# Patient Record
Sex: Female | Born: 1970
Health system: Southern US, Community
[De-identification: ages and names within clinical notes are randomized; demographics above are authoritative.]

## PROBLEM LIST (undated history)

## (undated) DIAGNOSIS — T7840XA Allergy, unspecified, initial encounter: Secondary | ICD-10-CM

## (undated) DIAGNOSIS — R7611 Nonspecific reaction to tuberculin skin test without active tuberculosis: Secondary | ICD-10-CM

## (undated) DIAGNOSIS — F419 Anxiety disorder, unspecified: Secondary | ICD-10-CM

## (undated) DIAGNOSIS — H40009 Preglaucoma, unspecified, unspecified eye: Secondary | ICD-10-CM

## (undated) DIAGNOSIS — F329 Major depressive disorder, single episode, unspecified: Secondary | ICD-10-CM

## (undated) DIAGNOSIS — C9210 Chronic myeloid leukemia, BCR/ABL-positive, not having achieved remission: Secondary | ICD-10-CM

## (undated) DIAGNOSIS — Z5189 Encounter for other specified aftercare: Secondary | ICD-10-CM

## (undated) DIAGNOSIS — T783XXA Angioneurotic edema, initial encounter: Secondary | ICD-10-CM

## (undated) DIAGNOSIS — L509 Urticaria, unspecified: Secondary | ICD-10-CM

## (undated) DIAGNOSIS — D649 Anemia, unspecified: Secondary | ICD-10-CM

## (undated) HISTORY — PX: TUBAL LIGATION: SHX77

## (undated) HISTORY — DX: Anxiety disorder, unspecified: F41.9

## (undated) HISTORY — DX: Major depressive disorder, single episode, unspecified: F32.9

## (undated) HISTORY — DX: Encounter for other specified aftercare: Z51.89

## (undated) HISTORY — DX: Angioneurotic edema, initial encounter: T78.3XXA

## (undated) HISTORY — DX: Nonspecific reaction to tuberculin skin test without active tuberculosis: R76.11

## (undated) HISTORY — DX: Chronic myeloid leukemia, BCR/ABL-positive, not having achieved remission: C92.10

## (undated) HISTORY — DX: Preglaucoma, unspecified, unspecified eye: H40.009

## (undated) HISTORY — DX: Urticaria, unspecified: L50.9

## (undated) HISTORY — DX: Allergy, unspecified, initial encounter: T78.40XA

## (undated) HISTORY — DX: Anemia, unspecified: D64.9

---

## 2002-04-30 ENCOUNTER — Other Ambulatory Visit: Admission: RE | Admit: 2002-04-30 | Discharge: 2002-04-30 | Payer: Self-pay | Admitting: Obstetrics

## 2004-04-14 ENCOUNTER — Inpatient Hospital Stay (HOSPITAL_COMMUNITY): Admission: AD | Admit: 2004-04-14 | Discharge: 2004-04-14 | Payer: Self-pay | Admitting: Obstetrics

## 2004-12-14 ENCOUNTER — Ambulatory Visit (HOSPITAL_COMMUNITY): Admission: RE | Admit: 2004-12-14 | Discharge: 2004-12-14 | Payer: Self-pay | Admitting: Obstetrics

## 2004-12-20 ENCOUNTER — Inpatient Hospital Stay (HOSPITAL_COMMUNITY): Admission: AD | Admit: 2004-12-20 | Discharge: 2004-12-25 | Payer: Self-pay | Admitting: Obstetrics & Gynecology

## 2004-12-22 ENCOUNTER — Encounter (INDEPENDENT_AMBULATORY_CARE_PROVIDER_SITE_OTHER): Payer: Self-pay | Admitting: *Deleted

## 2009-08-16 LAB — CONVERTED CEMR LAB: Pap Smear: NORMAL

## 2009-10-06 ENCOUNTER — Ambulatory Visit (HOSPITAL_COMMUNITY): Admission: RE | Admit: 2009-10-06 | Discharge: 2009-10-06 | Payer: Self-pay | Admitting: Obstetrics & Gynecology

## 2009-12-30 ENCOUNTER — Ambulatory Visit: Payer: Self-pay | Admitting: Internal Medicine

## 2010-01-28 ENCOUNTER — Ambulatory Visit: Payer: Self-pay | Admitting: Internal Medicine

## 2010-01-29 LAB — CONVERTED CEMR LAB
Basophils Absolute: 0.1 10*3/uL (ref 0.0–0.1)
Basophils Relative: 1.9 % (ref 0.0–3.0)
CO2: 30 meq/L (ref 19–32)
Creatinine, Ser: 0.7 mg/dL (ref 0.4–1.2)
Eosinophils Relative: 1.4 % (ref 0.0–5.0)
HCT: 38.9 % (ref 36.0–46.0)
HDL: 60.5 mg/dL (ref >39.00)
LDL Cholesterol: 124 mg/dL — ABNORMAL HIGH (ref 0–99)
Lymphocytes Relative: 30.8 % (ref 12.0–46.0)
MCHC: 34.2 g/dL (ref 30.0–36.0)
Monocytes Relative: 5.5 % (ref 3.0–12.0)
Neutrophils Relative %: 60.4 % (ref 43.0–77.0)
Platelets: 232 10*3/uL (ref 150.0–400.0)
Sodium: 142 meq/L (ref 135–145)
TSH: 2.43 microintl units/mL (ref 0.35–5.50)
Total CHOL/HDL Ratio: 3
VLDL: 4.4 mg/dL (ref 0.0–40.0)
WBC: 4.8 10*3/uL (ref 4.5–10.5)

## 2010-09-14 NOTE — Assessment & Plan Note (Signed)
Summary: new to estab/cbs   Vital Signs:  Patient profile:   40 year old female Height:      66 inches Weight:      187.4 pounds BMI:     30.36 Pulse rate:   60 / minute BP sitting:   102 / 70  Vitals Entered By: Shary Decamp (Dec 30, 2009 3:06 PM) CC: new pt to est Is Patient Diabetic? No   History of Present Illness: new patient  needs a check up   Preventive Screening-Counseling & Management  Alcohol-Tobacco     Smoking Status: never  Caffeine-Diet-Exercise     Caffeine use/day: 0     Does Patient Exercise: no      Drug Use:  no.    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 CV:  Denies chest pain or discomfort, shortness of breath with exertion, and swelling of feet. Resp:  Denies cough and wheezing. GI:  Denies bloody stools, diarrhea, nausea, and vomiting. GU:  sees gyn periods normal . Psych:  ++ stress at work but doing ok .  Physical Exam  General:  alert and well-developed.   Neck:  no masses and no thyromegaly.   Lungs:  normal respiratory effort, no intercostal retractions, no accessory muscle use, and normal breath sounds.   Heart:  normal rate, regular rhythm, no murmur, and no gallop.   Abdomen:  soft, non-tender, no distention, no  masses, no guarding, and no rigidity.   Extremities:  no pretibial edema bilaterally  Psych:  Oriented X3, memory intact for recent and remote, normally interactive, and good eye contact.  at some point during the visit she become emotional, see assessment and plan   Impression & Recommendations:  Problem # 1:  ROUTINE GENERAL MEDICAL EXAM@HEALTH  CARE FACL (ICD-V70.0) Td --aprox 2003 sees gyn  doing well, encourage her to have a  even healthier diet and exercise more At some point during the visit she becomes emotional, she has been living in the [place] for a few years but still misses her family. Counseled. Patient will let me know she feels she needs help. Labs, see instructions  Complete Medication List: 1)  Prenatal   Patient Instructions: 1)  came back fasting: 2)  FLP BMP AST ALT THS CBC Dx V70 3)  Please schedule a follow-up appointment in 1 year.    Preventive Care Screening  Pap Smear:    Date:  08/16/2009    Results:  normal   Mammogram:    Date:  08/15/2009    Results:  normal   Last Tetanus Booster:    Date:  08/15/2001    Results:  pt states she had Td in her country     Risk Factors:  Tobacco use:  never Drug use:  no Caffeine use:  0 drinks per day Alcohol use:  no Exercise:  no  PAP Smear History:     Date of Last PAP Smear:  08/16/2009    Results:  normal   Mammogram History:     Date of Last Mammogram:  08/15/2009    Results:  normal

## 2010-10-28 ENCOUNTER — Other Ambulatory Visit: Payer: Self-pay | Admitting: Obstetrics & Gynecology

## 2010-10-28 DIAGNOSIS — Z3682 Encounter for antenatal screening for nuchal translucency: Secondary | ICD-10-CM

## 2010-10-28 DIAGNOSIS — O09529 Supervision of elderly multigravida, unspecified trimester: Secondary | ICD-10-CM

## 2010-10-29 ENCOUNTER — Ambulatory Visit (HOSPITAL_COMMUNITY)
Admission: RE | Admit: 2010-10-29 | Discharge: 2010-10-29 | Disposition: A | Discharge status: Home / Self Care | Payer: BC Managed Care – PPO | Source: Ambulatory Visit | Attending: Obstetrics & Gynecology | Admitting: Obstetrics & Gynecology

## 2010-10-29 ENCOUNTER — Ambulatory Visit (HOSPITAL_COMMUNITY)
Admission: RE | Admit: 2010-10-29 | Discharge: 2010-10-29 | Disposition: A | Discharge status: Home / Self Care | Payer: BC Managed Care – PPO | Source: Ambulatory Visit | Attending: Obstetrics | Admitting: Obstetrics

## 2010-10-29 DIAGNOSIS — O09529 Supervision of elderly multigravida, unspecified trimester: Secondary | ICD-10-CM | POA: Insufficient documentation

## 2010-10-29 DIAGNOSIS — O3510X0 Maternal care for (suspected) chromosomal abnormality in fetus, unspecified, not applicable or unspecified: Secondary | ICD-10-CM | POA: Insufficient documentation

## 2010-10-29 DIAGNOSIS — Z3689 Encounter for other specified antenatal screening: Secondary | ICD-10-CM | POA: Insufficient documentation

## 2010-10-29 DIAGNOSIS — O34219 Maternal care for unspecified type scar from previous cesarean delivery: Secondary | ICD-10-CM | POA: Insufficient documentation

## 2010-10-29 DIAGNOSIS — O351XX0 Maternal care for (suspected) chromosomal abnormality in fetus, not applicable or unspecified: Secondary | ICD-10-CM | POA: Insufficient documentation

## 2010-10-29 DIAGNOSIS — Z3682 Encounter for antenatal screening for nuchal translucency: Secondary | ICD-10-CM

## 2010-11-03 ENCOUNTER — Ambulatory Visit (HOSPITAL_COMMUNITY): Payer: BC Managed Care – PPO

## 2010-11-25 ENCOUNTER — Other Ambulatory Visit: Payer: Self-pay | Admitting: Obstetrics & Gynecology

## 2010-11-25 DIAGNOSIS — Z0489 Encounter for examination and observation for other specified reasons: Secondary | ICD-10-CM

## 2010-11-25 DIAGNOSIS — O09529 Supervision of elderly multigravida, unspecified trimester: Secondary | ICD-10-CM

## 2010-12-08 ENCOUNTER — Ambulatory Visit (HOSPITAL_COMMUNITY)
Admission: RE | Admit: 2010-12-08 | Discharge: 2010-12-08 | Disposition: A | Discharge status: Home / Self Care | Payer: BC Managed Care – PPO | Source: Ambulatory Visit | Attending: Obstetrics & Gynecology | Admitting: Obstetrics & Gynecology

## 2010-12-08 ENCOUNTER — Encounter (HOSPITAL_COMMUNITY): Payer: Self-pay

## 2010-12-08 DIAGNOSIS — O358XX0 Maternal care for other (suspected) fetal abnormality and damage, not applicable or unspecified: Secondary | ICD-10-CM | POA: Insufficient documentation

## 2010-12-08 DIAGNOSIS — O09529 Supervision of elderly multigravida, unspecified trimester: Secondary | ICD-10-CM

## 2010-12-08 DIAGNOSIS — Z363 Encounter for antenatal screening for malformations: Secondary | ICD-10-CM | POA: Insufficient documentation

## 2010-12-08 DIAGNOSIS — Z1389 Encounter for screening for other disorder: Secondary | ICD-10-CM | POA: Insufficient documentation

## 2010-12-08 DIAGNOSIS — Z0489 Encounter for examination and observation for other specified reasons: Secondary | ICD-10-CM

## 2010-12-08 DIAGNOSIS — O34219 Maternal care for unspecified type scar from previous cesarean delivery: Secondary | ICD-10-CM | POA: Insufficient documentation

## 2010-12-08 DIAGNOSIS — IMO0002 Reserved for concepts with insufficient information to code with codable children: Secondary | ICD-10-CM

## 2010-12-13 ENCOUNTER — Other Ambulatory Visit: Payer: Self-pay | Admitting: Obstetrics & Gynecology

## 2010-12-13 DIAGNOSIS — O09529 Supervision of elderly multigravida, unspecified trimester: Secondary | ICD-10-CM

## 2010-12-13 DIAGNOSIS — O34219 Maternal care for unspecified type scar from previous cesarean delivery: Secondary | ICD-10-CM

## 2010-12-13 DIAGNOSIS — O358XX0 Maternal care for other (suspected) fetal abnormality and damage, not applicable or unspecified: Secondary | ICD-10-CM

## 2010-12-31 NOTE — Op Note (Signed)
[name], [name]               ACCOUNT NO.:  [id number]   MEDICAL RECORD NO.:  [id number]          PATIENT TYPE:  INP   LOCATION:  9128                          FACILITY:  [facility]   PHYSICIAN:  Charles A. Clearance Coots, M.D.DATE OF BIRTH:  1971/05/11   DATE OF PROCEDURE:  12/22/2004  DATE OF DISCHARGE:                                 OPERATIVE REPORT   PREOPERATIVE DIAGNOSIS:  Arrest of descent.   POSTOPERATIVE DIAGNOSIS:  Arrest of descent, occiput posterior.   PROCEDURE:  Primary low transverse cesarean section.   SURGEON:  Coral Ceo, MD   ASSISTANT:  Charlsie Merles, surgical technician.   ANESTHESIA:  Epidural.   ESTIMATED BLOOD LOSS:  800 mL.   IV FLUIDS:  300 mL.   URINE OUT:  200 mL.   COMPLICATIONS:  None.   DRESSINGS:  Foley to gravity.   FINDINGS:  A viable female at 0135, Apgars of 8 at 1 minutes and 9  at 5  minutes, weight of 6 pounds 15 ounces, cord pH of 7.23.  Normal uterus,  ovaries and fallopian tubes.   OPERATION:  The patient was brought to the operating room and after  satisfactory re-dosing of the epidural, the abdomen was prepped and draped  in the usual sterile fashion.  A Pfannenstiel's skin incision was made with  a scalpel that was deepened down to the fascia with a scalpel.  The fascia  was nicked in the midline and the fascial incision was extended to the left  and to the right with curved Mayo scissors.  The superior and inferior  fascial edges were taken off of the rectus muscle with both blunt and sharp  dissection.  The rectus muscle was bluntly and sharply divided in the  midline and the peritoneum was entered digitally and was digitally extended  to left and to right.  The bladder blade was positioned and the  vesicouterine fold of peritoneum above the reflection of the urinary bladder  was grasped with forceps and was incised and undermined with Metzenbaum  scissors.  The incision was extended to the left and to the right with  Metzenbaum scissors.  A bladder flap was bluntly developed and the bladder  blade was repositioned in front of the urinary bladder, placing it well out  of the operative field.  The uterus was then entered in the lower uterine  segment transversely with a scalpel.  The uterine incision was then extended  to the left and to the right digitally.  The amniotic sac was ruptured and  clear fluid was expelled.  The vertex was noted to be occiput posterior, but  asynclitic.  The occiput was rotated to an anterior position into the  incision and the vertex was then delivered with the aid of fundal pressure  from the assistant.  The infant's mouth and nose were suctioned with a  suction bulb and delivery was completed with the aid of fundal pressure from  the assistant.  The umbilical cord was doubly clamped and cut, and infant  was handed off to the nursery staff.  Cord pH and cord blood  were obtained  and the placenta was spontaneously expelled from the uterine cavity intact.  The uterus was exteriorized and the endometrial surface was thoroughly  debrided with a dry lap sponge.  Edges of the uterine incision were grasped  with ring forceps and the uterus was closed with a continuous interlocking  suture of 0 Monocryl for the first layer; the second layer was closed with a  continuous imbricating suture of 0 Monocryl.  Hemostasis was excellent.  The  uterus was placed back in its normal anatomic position.  The pelvic cavity  was thoroughly irrigated with warm saline solution and all clots were  removed.  The abdomen was then closed as follows:  Peritoneum was closed  with a continuous suture of 2-0 Monocryl, fascia was closed with continuous  suture of 0 PDS from each corner to the center, subcutaneous tissue was  thoroughly irrigated with warm saline solution and all areas of subcutaneous  bleeding were coagulated with a Bovie, skin was then approximated with  surgical stainless steel staples.  A  sterile bandage was applied to the  incision closure.  The surgical technician indicated that all needle, sponge  and instrument counts were correct x2.  The patient tolerated procedure well  and was transported to the recovery room in satisfactory condition.      CAH/MEDQ  D:  12/22/2004  T:  12/22/2004  Job:  [id number]

## 2010-12-31 NOTE — Discharge Summary (Signed)
[name], [name]               ACCOUNT NO.:  [id number]   MEDICAL RECORD NO.:  [id number]          PATIENT TYPE:  INP   LOCATION:  9128                          FACILITY:  [facility]   PHYSICIAN:  Roseanna Rainbow, M.D.DATE OF BIRTH:  1971-06-07   DATE OF ADMISSION:  12/20/2004  DATE OF DISCHARGE:  12/25/2004                                 DISCHARGE SUMMARY   CHIEF COMPLAINT:  The patient is a 40 year old para 0 with an estimated date  of confinement of April 28 with an intrauterine pregnancy at 42 weeks for  induction of labor.   PROBLEMS/RISKS:  In antepartum course:  1.  Advanced maternal age.  2.  Rh negative.  3.  GBS positive.   PRENATAL SCREEN:  Hemoglobin 12.8, hematocrit 39.8, platelets 231,000.  Blood type and RhB negative, antibody screen negative, RPR nonreactive,  rubella immune, hepatitis B surface antigen negative, HIV nonreactive.  Urine culture and sensitivity no uropathogens.  Pap smear within normal  limits.  Gonorrhea and Chlamydia negative.  One hour GCT 104.  Ultrasound on  February 1 at 28 weeks, estimated fetal weight percentile 52 percentile.   PAST OBSTETRICS/GYNECOLOGICAL HISTORY:  She denies.   PAST MEDICAL HISTORY:  She denies.   PAST SURGICAL HISTORY:  She denies.   FAMILY HISTORY:  Noncontributory.   SOCIAL HISTORY:  No tobacco, ethanol or drug use.   MEDICATIONS:  Prenatal vitamins.   ALLERGIES:  No known drug allergies.   PHYSICAL EXAMINATION:  VITAL SIGNS:  Stable, afebrile.   STUDIES:  Fetal heart tracing, baseline 120 without decelerations.  Good  long term variability.  There was a prolonged variable deceleration nadir 60  beats per minute with recovery to baseline.  Tocodynamometer with a regular  uterine contraction.  Shell vaginal exam per the R.N., one 2 cm dilated, 40%  effaced, posterior.   ASSESSMENT/PLAN:  Primigravida post dates for induction of labor with  suspicious fetal heart tracing, however, overall reassuring.   Borderline  Bishop'Lauren score and GBS positive.   PLAN:  Admission.  Ripening with Cervidil. If decelerations persist,  possible mechanical ripening.  Penicillin GBS prophylaxis, in active labor.   HOSPITAL COURSE:  The patient was subsequently admitted and Cervidil was  placed.  And after 12 hours, Cervidil was removed and Pitocin was started,  as well as penicillin prophylaxis for GBS.  She progressed to full  dilatation.  However, there was no decent beyond a +1/+2 station, and she  was brought for a cesarean delivery.  Please see the dictated operative  summary for further details.   The postoperative course was uneventful, and she was discharged to home on  postoperative day #3, tolerating a regular diet.   DISCHARGE DIAGNOSES:  Intrauterine pregnancy at 42 weeks, arrested descent,  active labor.   PROCEDURES:  Cesarean delivery.   CONDITION ON DISCHARGE:  Good.   DISCHARGE INSTRUCTIONS:  1.  Diet:  Regular.  2.  Activity:  Pelvic rest, progressive activity.   MEDICATIONS:  1.  Percocet.  2.  Ibuprofen.  3.  Prenatal vitamins.  4.  Micronor.   DISPOSITION:  The patient was to follow up in the office in 1-2 weeks.       LAJ/MEDQ  D:  01/21/2005  T:  01/21/2005  Job:  [id number]

## 2011-01-19 ENCOUNTER — Ambulatory Visit (HOSPITAL_COMMUNITY)
Admission: RE | Admit: 2011-01-19 | Discharge: 2011-01-19 | Disposition: A | Discharge status: Home / Self Care | Payer: BC Managed Care – PPO | Source: Ambulatory Visit | Attending: Obstetrics & Gynecology | Admitting: Obstetrics & Gynecology

## 2011-01-19 ENCOUNTER — Other Ambulatory Visit: Payer: Self-pay | Admitting: Obstetrics & Gynecology

## 2011-01-19 DIAGNOSIS — O09529 Supervision of elderly multigravida, unspecified trimester: Secondary | ICD-10-CM

## 2011-01-19 DIAGNOSIS — O34219 Maternal care for unspecified type scar from previous cesarean delivery: Secondary | ICD-10-CM

## 2011-01-19 DIAGNOSIS — IMO0001 Reserved for inherently not codable concepts without codable children: Secondary | ICD-10-CM | POA: Insufficient documentation

## 2011-01-19 DIAGNOSIS — O358XX0 Maternal care for other (suspected) fetal abnormality and damage, not applicable or unspecified: Secondary | ICD-10-CM

## 2011-02-11 ENCOUNTER — Inpatient Hospital Stay (HOSPITAL_COMMUNITY)
Admission: AD | Admit: 2011-02-11 | Discharge: 2011-02-11 | Disposition: A | Discharge status: Home / Self Care | Payer: BC Managed Care – PPO | Source: Ambulatory Visit | Attending: Obstetrics | Admitting: Obstetrics

## 2011-02-11 ENCOUNTER — Ambulatory Visit (HOSPITAL_COMMUNITY)
Admission: RE | Admit: 2011-02-11 | Discharge: 2011-02-11 | Disposition: A | Discharge status: Home / Self Care | Payer: BC Managed Care – PPO | Source: Ambulatory Visit | Attending: Obstetrics & Gynecology | Admitting: Obstetrics & Gynecology

## 2011-02-11 ENCOUNTER — Other Ambulatory Visit: Payer: Self-pay | Admitting: Obstetrics & Gynecology

## 2011-02-11 DIAGNOSIS — IMO0001 Reserved for inherently not codable concepts without codable children: Secondary | ICD-10-CM | POA: Insufficient documentation

## 2011-02-11 DIAGNOSIS — Z2989 Encounter for other specified prophylactic measures: Secondary | ICD-10-CM | POA: Insufficient documentation

## 2011-02-11 DIAGNOSIS — Z3480 Encounter for supervision of other normal pregnancy, unspecified trimester: Secondary | ICD-10-CM | POA: Insufficient documentation

## 2011-02-11 DIAGNOSIS — Z298 Encounter for other specified prophylactic measures: Secondary | ICD-10-CM | POA: Insufficient documentation

## 2011-02-11 DIAGNOSIS — O09529 Supervision of elderly multigravida, unspecified trimester: Secondary | ICD-10-CM | POA: Insufficient documentation

## 2011-02-11 DIAGNOSIS — O34219 Maternal care for unspecified type scar from previous cesarean delivery: Secondary | ICD-10-CM | POA: Insufficient documentation

## 2011-02-11 DIAGNOSIS — O358XX0 Maternal care for other (suspected) fetal abnormality and damage, not applicable or unspecified: Secondary | ICD-10-CM

## 2011-02-12 LAB — RH IMMUNE GLOBULIN WORKUP (NOT WOMEN'S HOSP)
Antibody Screen: NEGATIVE
Unit division: 0

## 2011-02-15 ENCOUNTER — Ambulatory Visit (HOSPITAL_COMMUNITY): Payer: BC Managed Care – PPO

## 2011-03-11 ENCOUNTER — Other Ambulatory Visit: Payer: Self-pay | Admitting: Obstetrics & Gynecology

## 2011-03-11 ENCOUNTER — Encounter (HOSPITAL_COMMUNITY): Payer: Self-pay

## 2011-03-11 ENCOUNTER — Ambulatory Visit (HOSPITAL_COMMUNITY)
Admission: RE | Admit: 2011-03-11 | Discharge: 2011-03-11 | Disposition: A | Discharge status: Home / Self Care | Payer: BC Managed Care – PPO | Source: Ambulatory Visit | Attending: Obstetrics & Gynecology | Admitting: Obstetrics & Gynecology

## 2011-03-11 DIAGNOSIS — O09529 Supervision of elderly multigravida, unspecified trimester: Secondary | ICD-10-CM

## 2011-03-11 DIAGNOSIS — IMO0001 Reserved for inherently not codable concepts without codable children: Secondary | ICD-10-CM | POA: Insufficient documentation

## 2011-03-11 DIAGNOSIS — O34219 Maternal care for unspecified type scar from previous cesarean delivery: Secondary | ICD-10-CM | POA: Insufficient documentation

## 2011-03-14 ENCOUNTER — Inpatient Hospital Stay (HOSPITAL_COMMUNITY): Admission: AD | Admit: 2011-03-14 | Payer: Self-pay | Source: Ambulatory Visit | Admitting: Obstetrics

## 2011-04-05 ENCOUNTER — Other Ambulatory Visit: Payer: Self-pay | Admitting: Obstetrics & Gynecology

## 2011-04-08 ENCOUNTER — Ambulatory Visit (HOSPITAL_COMMUNITY): Payer: BC Managed Care – PPO

## 2011-04-08 ENCOUNTER — Ambulatory Visit (HOSPITAL_COMMUNITY)
Admission: RE | Admit: 2011-04-08 | Discharge: 2011-04-08 | Disposition: A | Discharge status: Home / Self Care | Payer: BC Managed Care – PPO | Source: Ambulatory Visit | Attending: Obstetrics & Gynecology | Admitting: Obstetrics & Gynecology

## 2011-04-08 DIAGNOSIS — O09529 Supervision of elderly multigravida, unspecified trimester: Secondary | ICD-10-CM | POA: Insufficient documentation

## 2011-04-08 DIAGNOSIS — O34219 Maternal care for unspecified type scar from previous cesarean delivery: Secondary | ICD-10-CM | POA: Insufficient documentation

## 2011-04-08 DIAGNOSIS — IMO0001 Reserved for inherently not codable concepts without codable children: Secondary | ICD-10-CM

## 2011-04-19 ENCOUNTER — Other Ambulatory Visit: Payer: Self-pay | Admitting: Obstetrics

## 2011-04-19 ENCOUNTER — Ambulatory Visit (HOSPITAL_COMMUNITY)
Admission: RE | Admit: 2011-04-19 | Discharge: 2011-04-19 | Disposition: A | Discharge status: Home / Self Care | Payer: BC Managed Care – PPO | Source: Ambulatory Visit | Attending: Obstetrics | Admitting: Obstetrics

## 2011-04-19 DIAGNOSIS — O2690 Pregnancy related conditions, unspecified, unspecified trimester: Secondary | ICD-10-CM

## 2011-04-19 DIAGNOSIS — O34219 Maternal care for unspecified type scar from previous cesarean delivery: Secondary | ICD-10-CM | POA: Insufficient documentation

## 2011-04-19 DIAGNOSIS — IMO0001 Reserved for inherently not codable concepts without codable children: Secondary | ICD-10-CM | POA: Insufficient documentation

## 2011-04-19 DIAGNOSIS — O09529 Supervision of elderly multigravida, unspecified trimester: Secondary | ICD-10-CM | POA: Insufficient documentation

## 2011-04-19 NOTE — ED Notes (Signed)
Pt sent over for MFM evaluation from primary OB's (Dr. Clearance Coots) office after having a decel during her NST in their office.  Pt reports positive fetal movement and irregular contractions.

## 2011-04-20 ENCOUNTER — Encounter (HOSPITAL_COMMUNITY): Payer: Self-pay

## 2011-04-20 ENCOUNTER — Encounter (HOSPITAL_COMMUNITY)
Admission: RE | Admit: 2011-04-20 | Discharge: 2011-04-20 | Disposition: A | Discharge status: Home / Self Care | Payer: BC Managed Care – PPO | Source: Ambulatory Visit | Attending: Obstetrics & Gynecology | Admitting: Obstetrics & Gynecology

## 2011-04-20 ENCOUNTER — Other Ambulatory Visit (HOSPITAL_COMMUNITY): Payer: BC Managed Care – PPO

## 2011-04-20 LAB — CBC
HCT: 37.1 % (ref 36.0–46.0)
Hemoglobin: 12.5 g/dL (ref 12.0–15.0)
MCV: 88.5 fL (ref 78.0–100.0)
WBC: 9.6 10*3/uL (ref 4.0–10.5)

## 2011-04-20 LAB — SURGICAL PCR SCREEN
MRSA, PCR: NEGATIVE
Staphylococcus aureus: NEGATIVE

## 2011-04-20 NOTE — Patient Instructions (Signed)
[name]  04/20/2011   Your procedure is scheduled on:  04/25/11  Report to [facility] at 1230 PM.  Call this number if you have problems the morning of surgery: [phone]   Remember:   Do not eat food:After Midnight.  Do not drink clear liquids: 4 Hours before arrival.  Take these medicines the morning of surgery with A SIP OF WATER: none   Do not wear jewelry, make-up or nail polish.  Do not wear lotions, powders, or perfumes. You may wear deodorant.  Do not shave 48 hours prior to surgery.  Do not bring valuables to the hospital.  Contacts, dentures or bridgework may not be worn into surgery.  Leave suitcase in the car. After surgery it may be brought to your room.  For patients admitted to the hospital, checkout time is 11:00 AM the day of discharge.   Patients discharged the day of surgery will not be allowed to drive home.  Name and phone number of your driver: Marlowe Sax- [phone]  Special Instructions: CHG Shower Use Special Wash: 1/2 bottle night before surgery and 1/2 bottle morning of surgery.   Please read over the following fact sheets that you were given: none

## 2011-04-24 ENCOUNTER — Encounter (HOSPITAL_COMMUNITY): Payer: Self-pay | Admitting: Obstetrics & Gynecology

## 2011-04-24 DIAGNOSIS — O34219 Maternal care for unspecified type scar from previous cesarean delivery: Secondary | ICD-10-CM | POA: Diagnosis present

## 2011-04-25 ENCOUNTER — Inpatient Hospital Stay (HOSPITAL_COMMUNITY): Payer: BC Managed Care – PPO | Admitting: Anesthesiology

## 2011-04-25 ENCOUNTER — Inpatient Hospital Stay (HOSPITAL_COMMUNITY)
Admission: RE | Admit: 2011-04-25 | Discharge: 2011-04-28 | DRG: 371 | Disposition: A | Discharge status: Home / Self Care | Payer: BC Managed Care – PPO | Source: Ambulatory Visit | Attending: Obstetrics & Gynecology | Admitting: Obstetrics & Gynecology

## 2011-04-25 ENCOUNTER — Encounter (HOSPITAL_COMMUNITY): Payer: Self-pay | Admitting: Anesthesiology

## 2011-04-25 ENCOUNTER — Encounter (HOSPITAL_COMMUNITY): Payer: Self-pay | Admitting: *Deleted

## 2011-04-25 ENCOUNTER — Encounter (HOSPITAL_COMMUNITY)
Admission: RE | Disposition: A | Discharge status: Home / Self Care | Payer: Self-pay | Source: Ambulatory Visit | Attending: Obstetrics & Gynecology

## 2011-04-25 ENCOUNTER — Other Ambulatory Visit: Payer: Self-pay | Admitting: Obstetrics & Gynecology

## 2011-04-25 DIAGNOSIS — Z302 Encounter for sterilization: Secondary | ICD-10-CM

## 2011-04-25 DIAGNOSIS — O09529 Supervision of elderly multigravida, unspecified trimester: Secondary | ICD-10-CM | POA: Diagnosis present

## 2011-04-25 DIAGNOSIS — O34219 Maternal care for unspecified type scar from previous cesarean delivery: Principal | ICD-10-CM | POA: Diagnosis present

## 2011-04-25 LAB — TYPE AND SCREEN
ABO/RH(D): B NEG
Antibody Screen: POSITIVE

## 2011-04-25 SURGERY — Surgical Case
Anesthesia: Spinal | Laterality: Bilateral

## 2011-04-25 MED ORDER — PHENYLEPHRINE 40 MCG/ML (10ML) SYRINGE FOR IV PUSH (FOR BLOOD PRESSURE SUPPORT)
PREFILLED_SYRINGE | INTRAVENOUS | Status: AC
  Filled 2011-04-25: qty 10

## 2011-04-25 MED ORDER — PHENYLEPHRINE 40 MCG/ML (10ML) SYRINGE FOR IV PUSH (FOR BLOOD PRESSURE SUPPORT)
PREFILLED_SYRINGE | INTRAVENOUS | Status: AC
  Filled 2011-04-25: qty 5

## 2011-04-25 MED ORDER — OXYTOCIN 20 UNITS IN LACTATED RINGERS INFUSION - SIMPLE
INTRAVENOUS | Status: AC
  Filled 2011-04-25: qty 1000

## 2011-04-25 MED ORDER — MEPERIDINE HCL 25 MG/ML IJ SOLN: 6.2500 mg | INTRAMUSCULAR | Status: DC | PRN

## 2011-04-25 MED ORDER — DEXAMETHASONE SODIUM PHOSPHATE 10 MG/ML IJ SOLN
INTRAMUSCULAR | Status: AC
  Filled 2011-04-25: qty 1

## 2011-04-25 MED ORDER — KETOROLAC TROMETHAMINE 60 MG/2ML IM SOLN
INTRAMUSCULAR | Status: AC
  Administered 2011-04-25: 60 mg via INTRAMUSCULAR
  Filled 2011-04-25: qty 2

## 2011-04-25 MED ORDER — MORPHINE SULFATE (PF) 0.5 MG/ML IJ SOLN
INTRAMUSCULAR | Status: DC | PRN
  Administered 2011-04-25: .1 mg via INTRATHECAL

## 2011-04-25 MED ORDER — LACTATED RINGERS IV SOLN
INTRAVENOUS | Status: DC
  Administered 2011-04-25 (×2): via INTRAVENOUS

## 2011-04-25 MED ORDER — ONDANSETRON HCL 4 MG/2ML IJ SOLN
INTRAMUSCULAR | Status: AC
  Filled 2011-04-25: qty 2

## 2011-04-25 MED ORDER — IBUPROFEN 600 MG PO TABS
600.0000 mg | ORAL_TABLET | Freq: Four times a day (QID) | ORAL | Status: DC | PRN
  Administered 2011-04-25: 600 mg via ORAL
  Filled 2011-04-25: qty 1

## 2011-04-25 MED ORDER — DIPHENHYDRAMINE HCL 50 MG/ML IJ SOLN: 25.0000 mg | INTRAMUSCULAR | Status: DC | PRN

## 2011-04-25 MED ORDER — SODIUM CHLORIDE 0.9 % IJ SOLN: 3.0000 mL | INTRAMUSCULAR | Status: DC | PRN

## 2011-04-25 MED ORDER — SODIUM CHLORIDE 0.9 % IV SOLN: 1.0000 ug/kg/h | INTRAVENOUS | Status: DC | PRN

## 2011-04-25 MED ORDER — METOCLOPRAMIDE HCL 5 MG/ML IJ SOLN
INTRAMUSCULAR | Status: AC
  Administered 2011-04-25: 10 mg
  Filled 2011-04-25: qty 2

## 2011-04-25 MED ORDER — FENTANYL CITRATE 0.05 MG/ML IJ SOLN
INTRAMUSCULAR | Status: AC
  Filled 2011-04-25: qty 2

## 2011-04-25 MED ORDER — BUPIVACAINE IN DEXTROSE 0.75-8.25 % IT SOLN
INTRATHECAL | Status: DC | PRN
  Administered 2011-04-25: 1.5 mL via INTRATHECAL

## 2011-04-25 MED ORDER — MORPHINE SULFATE 0.5 MG/ML IJ SOLN
INTRAMUSCULAR | Status: AC
  Filled 2011-04-25: qty 10

## 2011-04-25 MED ORDER — DIPHENHYDRAMINE HCL 50 MG/ML IJ SOLN: 12.5000 mg | INTRAMUSCULAR | Status: DC | PRN

## 2011-04-25 MED ORDER — SCOPOLAMINE 1 MG/3DAYS TD PT72
1.0000 | MEDICATED_PATCH | Freq: Once | TRANSDERMAL | Status: DC
  Administered 2011-04-25: 1.5 mg via TRANSDERMAL

## 2011-04-25 MED ORDER — KETOROLAC TROMETHAMINE 30 MG/ML IJ SOLN: 30.0000 mg | Freq: Four times a day (QID) | INTRAMUSCULAR | Status: AC | PRN

## 2011-04-25 MED ORDER — LACTATED RINGERS IV SOLN
Freq: Once | INTRAVENOUS | Status: AC
  Administered 2011-04-25: 13:00:00 via INTRAVENOUS

## 2011-04-25 MED ORDER — NALBUPHINE HCL 10 MG/ML IJ SOLN: 5.0000 mg | INTRAMUSCULAR | Status: DC | PRN

## 2011-04-25 MED ORDER — CEFAZOLIN SODIUM-DEXTROSE 2-3 GM-% IV SOLR
2.0000 g | Freq: Once | INTRAVENOUS | Status: AC
  Administered 2011-04-25: 2 g via INTRAVENOUS
  Filled 2011-04-25: qty 50

## 2011-04-25 MED ORDER — OXYTOCIN 10 UNIT/ML IJ SOLN
20.0000 [IU] | INTRAVENOUS | Status: DC | PRN
  Administered 2011-04-25: 20 [IU] via INTRAVENOUS

## 2011-04-25 MED ORDER — METOCLOPRAMIDE HCL 5 MG/ML IJ SOLN: 10.0000 mg | Freq: Three times a day (TID) | INTRAMUSCULAR | Status: DC | PRN

## 2011-04-25 MED ORDER — EPHEDRINE SULFATE 50 MG/ML IJ SOLN
INTRAMUSCULAR | Status: DC | PRN
  Administered 2011-04-25: 10 mg via INTRAVENOUS
  Administered 2011-04-25: 20 mg via INTRAVENOUS
  Administered 2011-04-25 (×2): 10 mg via INTRAVENOUS

## 2011-04-25 MED ORDER — KETOROLAC TROMETHAMINE 60 MG/2ML IM SOLN
60.0000 mg | Freq: Once | INTRAMUSCULAR | Status: AC | PRN
  Administered 2011-04-25: 60 mg via INTRAMUSCULAR

## 2011-04-25 MED ORDER — OXYTOCIN 20 UNITS IN LACTATED RINGERS INFUSION - SIMPLE: 125.0000 mL/h | INTRAVENOUS | Status: DC

## 2011-04-25 MED ORDER — FENTANYL CITRATE 0.05 MG/ML IJ SOLN
INTRAMUSCULAR | Status: DC | PRN
  Administered 2011-04-25: 15 ug via INTRATHECAL

## 2011-04-25 MED ORDER — DEXAMETHASONE SODIUM PHOSPHATE 10 MG/ML IJ SOLN
INTRAMUSCULAR | Status: DC | PRN
  Administered 2011-04-25 (×2): 5 mg via INTRAVENOUS

## 2011-04-25 MED ORDER — EPHEDRINE 5 MG/ML INJ
INTRAVENOUS | Status: AC
  Filled 2011-04-25: qty 10

## 2011-04-25 MED ORDER — PHENYLEPHRINE HCL 10 MG/ML IJ SOLN
INTRAMUSCULAR | Status: DC | PRN
  Administered 2011-04-25 (×4): 80 ug via INTRAVENOUS

## 2011-04-25 MED ORDER — ONDANSETRON HCL 4 MG/2ML IJ SOLN
4.0000 mg | Freq: Three times a day (TID) | INTRAMUSCULAR | Status: DC | PRN
Start: 2011-04-25 — End: 2011-04-28

## 2011-04-25 MED ORDER — SCOPOLAMINE 1 MG/3DAYS TD PT72
MEDICATED_PATCH | TRANSDERMAL | Status: AC
  Administered 2011-04-25: 1.5 mg via TRANSDERMAL
  Filled 2011-04-25: qty 1

## 2011-04-25 MED ORDER — NALOXONE HCL 0.4 MG/ML IJ SOLN: 0.4000 mg | INTRAMUSCULAR | Status: DC | PRN

## 2011-04-25 MED ORDER — ONDANSETRON HCL 4 MG/2ML IJ SOLN
INTRAMUSCULAR | Status: DC | PRN
  Administered 2011-04-25 (×2): 2 mg via INTRAVENOUS

## 2011-04-25 MED ORDER — DIPHENHYDRAMINE HCL 25 MG PO CAPS: 25.0000 mg | ORAL_CAPSULE | ORAL | Status: DC | PRN

## 2011-04-25 SURGICAL SUPPLY — 39 items
CANISTER WOUND CARE 500ML ATS (WOUND CARE) IMPLANT
CHLORAPREP W/TINT 26ML (MISCELLANEOUS) ×2 IMPLANT
CLOTH BEACON ORANGE TIMEOUT ST (SAFETY) ×2 IMPLANT
CONTAINER PREFILL 10% NBF 15ML (MISCELLANEOUS) IMPLANT
DERMABOND ADVANCED (GAUZE/BANDAGES/DRESSINGS) ×2 IMPLANT
DRESSING TELFA 8X3 (GAUZE/BANDAGES/DRESSINGS) ×2 IMPLANT
DRSG VAC ATS LRG SENSATRAC (GAUZE/BANDAGES/DRESSINGS) IMPLANT
DRSG VAC ATS MED SENSATRAC (GAUZE/BANDAGES/DRESSINGS) IMPLANT
DRSG VAC ATS SM SENSATRAC (GAUZE/BANDAGES/DRESSINGS) IMPLANT
ELECT REM PT RETURN 9FT ADLT (ELECTROSURGICAL) ×2
ELECTRODE REM PT RTRN 9FT ADLT (ELECTROSURGICAL) ×1 IMPLANT
EXTRACTOR VACUUM M CUP 4 TUBE (SUCTIONS) IMPLANT
GAUZE SPONGE 4X4 12PLY STRL LF (GAUZE/BANDAGES/DRESSINGS) ×4 IMPLANT
GLOVE BIO SURGEON STRL SZ 6.5 (GLOVE) ×4 IMPLANT
GOWN PREVENTION PLUS LG XLONG (DISPOSABLE) ×6 IMPLANT
KIT ABG SYR 3ML LUER SLIP (SYRINGE) IMPLANT
NEEDLE HYPO 25X5/8 SAFETYGLIDE (NEEDLE) ×2 IMPLANT
NS IRRIG 1000ML POUR BTL (IV SOLUTION) ×2 IMPLANT
PACK C SECTION WH (CUSTOM PROCEDURE TRAY) ×2 IMPLANT
PAD ABD 7.5X8 STRL (GAUZE/BANDAGES/DRESSINGS) ×2 IMPLANT
RTRCTR C-SECT PINK 25CM LRG (MISCELLANEOUS) IMPLANT
RTRCTR C-SECT PINK 34CM XLRG (MISCELLANEOUS) IMPLANT
SLEEVE SCD COMPRESS KNEE MED (MISCELLANEOUS) IMPLANT
STAPLER VISISTAT 35W (STAPLE) IMPLANT
SUT MNCRL 0 VIOLET CTX 36 (SUTURE) ×2 IMPLANT
SUT MNCRL AB 3-0 PS2 27 (SUTURE) IMPLANT
SUT MONOCRYL 0 CTX 36 (SUTURE) ×2
SUT PDS AB 0 CTX 36 PDP370T (SUTURE) ×2 IMPLANT
SUT PLAIN 0 NONE (SUTURE) IMPLANT
SUT VIC AB 0 CT1 27 (SUTURE)
SUT VIC AB 0 CT1 27XBRD ANBCTR (SUTURE) IMPLANT
SUT VIC AB 2-0 CT1 (SUTURE) IMPLANT
SUT VIC AB 2-0 CT1 27 (SUTURE) ×1
SUT VIC AB 2-0 CT1 TAPERPNT 27 (SUTURE) ×1 IMPLANT
SUT VIC AB 2-0 SH 27 (SUTURE)
SUT VIC AB 2-0 SH 27XBRD (SUTURE) IMPLANT
TOWEL OR 17X24 6PK STRL BLUE (TOWEL DISPOSABLE) ×4 IMPLANT
TRAY FOLEY CATH 14FR (SET/KITS/TRAYS/PACK) ×2 IMPLANT
WATER STERILE IRR 1000ML POUR (IV SOLUTION) ×2 IMPLANT

## 2011-04-25 NOTE — Op Note (Signed)
Cesarean Section Procedure Note   [name]   04/25/2011  Indications: Scheduled Proceedure/Maternal Request   Pre-operative Diagnosis: ELECTIVE REPEAT C-SECTION/BTL  Post-operative Diagnosis: Same   Surgeon: Antionette Char A  Assistants: Coral Ceo A  Anesthesia: spinal  Procedure Details:  The patient was seen in the Holding Room. The risks, benefits, complications, treatment options, and expected outcomes were discussed with the patient. The patient concurred with the proposed plan, giving informed consent. The patient was identified as [name] and the procedure verified as C-Section Delivery. A Time Out was held and the above information confirmed.  After induction of anesthesia, the patient was draped and prepped in the usual sterile manner. A transverse incision was made and carried down through the subcutaneous tissue to the fascia. The fascial incision was made and extended transversely. The fascia was separated from the underlying rectus tissue superiorly and inferiorly. The peritoneum was identified and entered. The peritoneal incision was extended longitudinally. The utero-vesical peritoneal reflection was incised transversely and the bladder flap was bluntly freed from the lower uterine segment. A low transverse uterine incision was made. Delivered from cephalic presentation was a living newborn female infant with Apgar scores of 7 at one minute and 8 at five minutes. A cord ph was not sent. The umbilical cord was clamped and cut cord. A sample was obtained for evaluation. The placenta was removed Intact and appeared normal.  The uterine incision was closed with running locked sutures of 1-0 Monocryl. A second imbricating layer of the same suture was placed.  Hemostasis was observed. The paracolic gutters were irrigated. The fascia was then reapproximated with running sutures of 1-0Vicryl. The subcuticular closure was performed using 3-0 Monocryl.  Instrument,  sponge, and needle counts were correct prior the abdominal closure and were correct at the conclusion of the case.    Findings:  See above   Estimated Blood Loss: 650 ml  Total IV Fluids:  per Anesthesiology  Urine Output:  per Anesthesiology  Specimens:  Specimens    Placenta       Complications: no complications  Disposition: PACU - hemodynamically stable.  Maternal Condition: stable   Baby condition / location:  nursery-stable    Signed: Surgeon(s): Roseanna Rainbow, MD Brock Bad, MD     MAC, paracervical block

## 2011-04-25 NOTE — H&P (Signed)
[name] is a 40 y.o. female presenting for elective repeat C/D/BTL. Maternal Medical History:  Reason for admission: For elective repeat C/D/BTL  Fetal activity: Perceived fetal activity is normal.    Prenatal complications: Single umbilical artery    OB History    Grav Para Term Preterm Abortions TAB SAB Ect Mult Living   2 1 1  0 0 0 0 0 0 1     Past Medical History  Diagnosis Date  . No pertinent past medical history    Past Surgical History  Procedure Date  . Cesarean section 2006   Family History: family history is not on file. Social History:  reports that she has never smoked. She does not have any smokeless tobacco history on file. She reports that she does not drink alcohol or use illicit drugs.  Review of Systems  Constitutional: Negative for fever.  Eyes: Negative for blurred vision.  Respiratory: Negative for shortness of breath.   Gastrointestinal: Negative for vomiting.  Skin: Negative for rash.  Neurological: Negative for headaches.      Last menstrual period 07/25/2010. Maternal Exam:  Abdomen: Patient reports no abdominal tenderness. Surgical scars: low transverse.   Fetal presentation: vertex  Introitus: not evaluated.     Fetal Exam Fetal Monitor Review: Mode: hand-held doppler probe.       Physical Exam  Constitutional: She appears well-developed.  HENT:  Head: Normocephalic.  Neck: Neck supple. No thyromegaly present.  Cardiovascular: Normal rate and regular rhythm.   Respiratory: Breath sounds normal.  GI: Soft. Bowel sounds are normal.  Skin: No rash noted.    Prenatal labs: ABO, Rh: --/--/B NEG (06/29 1220) Antibody: NEG (06/29 1220) Rubella:   RPR: NON REACTIVE (09/05 1625)  HBsAg:    HIV:    GBS:     Assessment/Plan: 40 y.o. w/an IUP @ [redacted]w[redacted]d for an elective repeat C/D/BTL  Admit Repeat C/D w/ BTL   JACKSON-MOORE,Natassja Ollis A 04/25/2011, 12:02 PM

## 2011-04-25 NOTE — Anesthesia Preprocedure Evaluation (Signed)
Anesthesia Evaluation  Name, MR# and DOB Patient awake  General Assessment Comment  Reviewed: Allergy & Precautions, H&P , NPO status , Patient's Chart, lab work & pertinent test results, reviewed documented beta blocker date and time   History of Anesthesia Complications Negative for: history of anesthetic complications  Airway Mallampati: II TM Distance: >3 FB Neck ROM: full    Dental  (+) Teeth Intact   Pulmonary  clear to auscultation  breath sounds clear to auscultation none    Cardiovascular regular Normal    Neuro/Psych Negative Neurological ROS  Negative Psych ROS  GI/Hepatic/Renal negative GI ROS  negative Liver ROS  negative Renal ROS        Endo/Other  Negative Endocrine ROS (+)      Abdominal   Musculoskeletal   Hematology negative hematology ROS (+)   Peds  Reproductive/Obstetrics (+) Pregnancy    Anesthesia Other Findings             Anesthesia Physical Anesthesia Plan  ASA: II  Anesthesia Plan: Spinal   Post-op Pain Management:    Induction:   Airway Management Planned:   Additional Equipment:   Intra-op Plan:   Post-operative Plan:   Informed Consent: I have reviewed the patients History and Physical, chart, labs and discussed the procedure including the risks, benefits and alternatives for the proposed anesthesia with the patient or authorized representative who has indicated his/her understanding and acceptance.     Plan Discussed with: CRNA and Surgeon  Anesthesia Plan Comments:         Anesthesia Quick Evaluation  

## 2011-04-25 NOTE — Anesthesia Procedure Notes (Signed)
Spinal Block  Patient location during procedure: OR Start time: 04/25/2011 2:39 PM Staffing Performed by: anesthesiologist  Preanesthetic Checklist Completed: patient identified, site marked, surgical consent, pre-op evaluation, timeout performed, IV checked, risks and benefits discussed and monitors and equipment checked Spinal Block Patient position: sitting Prep: site prepped and draped and DuraPrep Patient monitoring: heart rate, cardiac monitor, continuous pulse ox and blood pressure Approach: midline Location: L3-4 Injection technique: single-shot Needle Needle type: Sprotte  Needle gauge: 24 G Needle length: 9 cm Assessment Sensory level: T4

## 2011-04-25 NOTE — Anesthesia Postprocedure Evaluation (Signed)
Anesthesia Post Note  Patient: [name]  Procedure(s) Performed:  CESAREAN SECTION WITH BILATERAL TUBAL LIGATION  Anesthesia type: Spinal  Patient location: PACU  Post pain: Pain level controlled  Post assessment: Post-op Vital signs reviewed  Last Vitals:  Filed Vitals:   04/25/11 1600  BP: 104/45  Pulse: 65  Temp:   Resp: 56    Post vital signs: Reviewed  Level of consciousness: awake  Complications: No apparent anesthesia complications

## 2011-04-25 NOTE — Transfer of Care (Signed)
Immediate Anesthesia Transfer of Care Note  Patient: [name]  Procedure(s) Performed:  CESAREAN SECTION WITH BILATERAL TUBAL LIGATION  Patient Location: PACU  Anesthesia Type: Spinal  Level of Consciousness: awake, alert  and oriented  Airway & Oxygen Therapy: Patient Spontanous Breathing  Post-op Assessment: Report given to PACU RN and Post -op Vital signs reviewed and stable  Post vital signs: Reviewed and stable  Complications: No apparent anesthesia complications

## 2011-04-25 NOTE — Consult Note (Signed)
Called to attend elective repeat C/S at term with only issue the note that fetus has a two vessel cord.  At delivery infant in vertex; manually extracted with spontaneous cries and active movement of all extremities. Tone slow to normalize. Given vigorous tactile stim and bulb suction of nasooropharynx.   Single umbilical artery confirmed. NO void or stool while in OR.  Shown to parents then carried to Transitional Nursery in warm blanket by father.      Dagoberto Ligas MD [facility]

## 2011-04-26 MED ORDER — IBUPROFEN 600 MG PO TABS
600.0000 mg | ORAL_TABLET | Freq: Four times a day (QID) | ORAL | Status: DC
  Administered 2011-04-26 – 2011-04-28 (×9): 600 mg via ORAL
  Filled 2011-04-26 (×9): qty 1

## 2011-04-26 MED ORDER — MEASLES, MUMPS & RUBELLA VAC ~~LOC~~ INJ
0.5000 mL | INJECTION | Freq: Once | SUBCUTANEOUS | Status: DC
  Filled 2011-04-26: qty 0.5

## 2011-04-26 MED ORDER — FERROUS SULFATE 325 (65 FE) MG PO TABS
325.0000 mg | ORAL_TABLET | Freq: Two times a day (BID) | ORAL | Status: DC
  Administered 2011-04-26 – 2011-04-28 (×4): 325 mg via ORAL
  Filled 2011-04-26 (×4): qty 1

## 2011-04-26 MED ORDER — OXYCODONE-ACETAMINOPHEN 5-325 MG PO TABS
1.0000 | ORAL_TABLET | ORAL | Status: DC | PRN
  Administered 2011-04-26 – 2011-04-27 (×8): 1 via ORAL
  Filled 2011-04-26 (×8): qty 1

## 2011-04-26 MED ORDER — ZOLPIDEM TARTRATE 5 MG PO TABS: 5.0000 mg | ORAL_TABLET | Freq: Every evening | ORAL | Status: DC | PRN

## 2011-04-26 MED ORDER — DIPHENHYDRAMINE HCL 25 MG PO CAPS: 25.0000 mg | ORAL_CAPSULE | Freq: Four times a day (QID) | ORAL | Status: DC | PRN

## 2011-04-26 MED ORDER — MAGNESIUM HYDROXIDE 400 MG/5ML PO SUSP: 30.0000 mL | ORAL | Status: DC | PRN

## 2011-04-26 MED ORDER — SIMETHICONE 80 MG PO CHEW
80.0000 mg | CHEWABLE_TABLET | ORAL | Status: DC | PRN
  Administered 2011-04-26 – 2011-04-28 (×6): 80 mg via ORAL

## 2011-04-26 MED ORDER — LANOLIN HYDROUS EX OINT: 1.0000 "application " | TOPICAL_OINTMENT | CUTANEOUS | Status: DC | PRN

## 2011-04-26 MED ORDER — ONDANSETRON HCL 4 MG PO TABS: 4.0000 mg | ORAL_TABLET | ORAL | Status: DC | PRN

## 2011-04-26 MED ORDER — LACTATED RINGERS IV SOLN
INTRAVENOUS | Status: DC
  Administered 2011-04-26: 02:00:00 via INTRAVENOUS

## 2011-04-26 MED ORDER — TETANUS-DIPHTH-ACELL PERTUSSIS 5-2.5-18.5 LF-MCG/0.5 IM SUSP: 0.5000 mL | Freq: Once | INTRAMUSCULAR | Status: DC

## 2011-04-26 MED ORDER — WITCH HAZEL-GLYCERIN EX PADS: 1.0000 "application " | MEDICATED_PAD | CUTANEOUS | Status: DC | PRN

## 2011-04-26 MED ORDER — DIBUCAINE 1 % RE OINT: 1.0000 "application " | TOPICAL_OINTMENT | RECTAL | Status: DC | PRN

## 2011-04-26 MED ORDER — ONDANSETRON HCL 4 MG/2ML IJ SOLN: 4.0000 mg | INTRAMUSCULAR | Status: DC | PRN

## 2011-04-26 MED ORDER — SENNOSIDES-DOCUSATE SODIUM 8.6-50 MG PO TABS
2.0000 | ORAL_TABLET | Freq: Every day | ORAL | Status: DC
  Administered 2011-04-26 – 2011-04-27 (×2): 2 via ORAL

## 2011-04-26 MED ORDER — PRENATAL PLUS 27-1 MG PO TABS
1.0000 | ORAL_TABLET | Freq: Every day | ORAL | Status: DC
  Administered 2011-04-26 – 2011-04-28 (×3): 1 via ORAL
  Filled 2011-04-26 (×3): qty 1

## 2011-04-26 MED ORDER — LACTATED RINGERS IV SOLN
INTRAVENOUS | Status: DC
Start: 2011-04-26 — End: 2011-04-28

## 2011-04-26 NOTE — Progress Notes (Signed)
Encounter addended by: Madison Hickman on: 04/26/2011  8:43 AM<BR>     Documentation filed: Notes Section

## 2011-04-26 NOTE — Progress Notes (Signed)
Subjective: Postpartum Day 1: Cesarean Delivery Patient reports incisional pain, tolerating PO, + flatus and no problems voiding.    Objective: Vital signs in last 24 hours: Temp:  [97.4 F (36.3 C)-98.7 F (37.1 C)] 98.5 F (36.9 C) (09/11 0610) Pulse Rate:  [52-73] 71  (09/11 0610) Resp:  [16-20] 18  (09/11 0610) BP: (95-132)/(40-96) 97/65 mmHg (09/11 0610) SpO2:  [96 %-100 %] 98 % (09/11 0610) Weight:  [94.348 kg (208 lb)] 208 lb (94.348 kg) (09/10 1721)  Physical Exam:  General: alert and no distress Lochia: appropriate Uterine Fundus: firm Incision: healing well DVT Evaluation: No evidence of DVT seen on physical exam.   Basename 04/26/11 0550  HGB 10.3*  HCT --    Assessment/Plan: Status post Cesarean section. Doing well postoperatively.  Continue current care.  HARPER,CHARLES A 04/26/2011, 1:14 PM

## 2011-04-26 NOTE — Anesthesia Postprocedure Evaluation (Signed)
  Anesthesia Post-op Note  Patient: [name]  Procedure(s) Performed:  CESAREAN SECTION WITH BILATERAL TUBAL LIGATION  Patient Location: PACU and Mother/Baby  Anesthesia Type: Spinal  Level of Consciousness: awake, alert  and oriented  Airway and Oxygen Therapy: Patient Spontanous Breathing  Post-op Pain: none  Post-op Assessment: Post-op Vital signs reviewed and Patient's Cardiovascular Status Stable  Post-op Vital Signs: Reviewed and stable  Complications: No apparent anesthesia complications

## 2011-04-27 MED ORDER — MIDAZOLAM HCL 2 MG/2ML IJ SOLN
INTRAMUSCULAR | Status: AC
  Filled 2011-04-27: qty 2

## 2011-04-27 MED ORDER — FENTANYL CITRATE 0.05 MG/ML IJ SOLN
INTRAMUSCULAR | Status: AC
  Filled 2011-04-27: qty 2

## 2011-04-27 NOTE — Progress Notes (Signed)
  Subjective: POD# 2 s/p Cesarean Delivery.  Indications: elective  RH status/Rubella reviewed. Feeding: breast Patient reports tolerating PO.  Denies HA/SOB/C/P/N/V/dizziness.  Reports flatus or BM. Breast symptoms:None.2  She reports vaginal bleeding as normal, without clots.  She is ambulating, urinating without difficulty.     Objective: Vital signs in last 24 hours: BP 100/66  Pulse 70  Temp(Src) 98.1 F (36.7 C) (Oral)  Resp 18  Wt 94.348 kg (208 lb)  SpO2 99%  LMP 07/25/2010  Breastfeeding? Unknown       Physical Exam:  General: alert CV: Regular rate and rhythm Resp: clear Abdomen: soft, nontender, normal bowel sounds Lochia: minimal Uterine Fundus: firm, below umbilicus, nontender Incision: clean, dry and intact Ext: extremities normal, atraumatic, no cyanosis or edema    Basename 04/26/11 0550  HGB 10.3*  HCT --      Assessment/Plan: 40 y.o.  status post Cesarean section. POD# 2.   Doing well, stable.               Ambulate IS Routine post-op care  JACKSON-MOORE,Lucillie Kiesel A 04/27/2011, 8:47 AM

## 2011-04-28 MED ORDER — OXYCODONE-ACETAMINOPHEN 5-325 MG PO TABS: 2.0000 | ORAL_TABLET | Freq: Four times a day (QID) | ORAL | Status: AC | PRN

## 2011-04-28 NOTE — Progress Notes (Signed)
Subjective: POD #3 s/p LTC/S  Indication:elective Patient reports tolerating PO.  Denies HA/SOB/C/P/N/V/dizziness.  Reports flatus or BM. Breast symptoms: None.  She reports vaginal bleeding as normal, without clots.  She is ambulating, urinating without difficulty.     Objective: Vital signs in last 24 hours: BP 103/70  Pulse 60  Temp(Src) 98.4 F (36.9 C) (Oral)  Resp 18  Wt 94.348 kg (208 lb)  SpO2 99%  LMP 07/25/2010  Breastfeeding? Unknown  Physical Exam:  General: alert CV: Regular rate and rhythm Resp: clear Abdomen: soft, nontender, normal bowel sounds Lochia: minimal Uterine Fundus: firm, below umbilicus, nontender Incision: clean, dry and intact Ext: extremities normal, atraumatic, no cyanosis or edema  Basename 04/26/11 0550  HGB 10.3*  HCT --    Assessment/Plan: 39 y.o. status post Cesarean section POD# 3.  Normal post-operative exam  Routine post-op care D/C home  JACKSON-MOORE,Nyesha Cliff A 04/28/2011, 10:07 AM

## 2011-04-28 NOTE — Discharge Summary (Signed)
Obstetric Discharge Summary Reason for Admission: cesarean section Prenatal Procedures: none Intrapartum Procedures: cesarean: low cervical, transverse and tubal ligation Postpartum Procedures: none Complications-Operative and Postpartum: none Hemoglobin  Date Value Range Status  04/26/2011 10.3* 12.0-15.0 (g/dL) Final     HCT  Date Value Range Status  04/20/2011 37.1  36.0-46.0 (%) Final    Discharge Diagnoses: Term Pregnancy-delivered  Discharge Information: Date: 04/28/2011 Activity: pelvic rest Diet: routine Medications: Percocet Condition: stable Instructions: See above Discharge to: home Follow-up Information    Follow up with Antionette Char A, MD. Call in 2 weeks.   Contact information:   [address] [place] [address] [phone]          Newborn Data: Live born female  Birth Weight: 6 lb 3.7 oz (2825 g) APGAR: 7, 8  Home with mother.  JACKSON-MOORE,Terrel Manalo A 04/28/2011, 10:12 AM

## 2011-08-16 DIAGNOSIS — C9210 Chronic myeloid leukemia, BCR/ABL-positive, not having achieved remission: Secondary | ICD-10-CM

## 2011-08-16 HISTORY — DX: Chronic myeloid leukemia, BCR/ABL-positive, not having achieved remission: C92.10

## 2011-08-28 ENCOUNTER — Ambulatory Visit (INDEPENDENT_AMBULATORY_CARE_PROVIDER_SITE_OTHER): Payer: BC Managed Care – PPO

## 2011-08-28 DIAGNOSIS — Z23 Encounter for immunization: Secondary | ICD-10-CM

## 2011-09-04 ENCOUNTER — Ambulatory Visit (INDEPENDENT_AMBULATORY_CARE_PROVIDER_SITE_OTHER): Payer: BC Managed Care – PPO

## 2011-09-04 DIAGNOSIS — J069 Acute upper respiratory infection, unspecified: Secondary | ICD-10-CM

## 2011-09-11 ENCOUNTER — Ambulatory Visit (INDEPENDENT_AMBULATORY_CARE_PROVIDER_SITE_OTHER): Payer: BC Managed Care – PPO

## 2011-09-11 DIAGNOSIS — H6500 Acute serous otitis media, unspecified ear: Secondary | ICD-10-CM

## 2011-09-11 DIAGNOSIS — J069 Acute upper respiratory infection, unspecified: Secondary | ICD-10-CM

## 2011-10-31 ENCOUNTER — Encounter (HOSPITAL_COMMUNITY)
Admission: RE | Admit: 2011-10-31 | Discharge: 2011-10-31 | Disposition: A | Discharge status: Home / Self Care | Payer: Self-pay | Source: Ambulatory Visit | Attending: Obstetrics & Gynecology | Admitting: Obstetrics & Gynecology

## 2011-10-31 DIAGNOSIS — O923 Agalactia: Secondary | ICD-10-CM | POA: Insufficient documentation

## 2011-12-01 ENCOUNTER — Encounter (HOSPITAL_COMMUNITY)
Admission: RE | Admit: 2011-12-01 | Discharge: 2011-12-01 | Disposition: A | Discharge status: Home / Self Care | Payer: Self-pay | Source: Ambulatory Visit | Attending: Obstetrics & Gynecology | Admitting: Obstetrics & Gynecology

## 2011-12-01 DIAGNOSIS — O923 Agalactia: Secondary | ICD-10-CM | POA: Insufficient documentation

## 2012-01-01 ENCOUNTER — Encounter (HOSPITAL_COMMUNITY)
Admission: RE | Admit: 2012-01-01 | Discharge: 2012-01-01 | Disposition: A | Discharge status: Home / Self Care | Payer: Self-pay | Source: Ambulatory Visit | Attending: Obstetrics & Gynecology | Admitting: Obstetrics & Gynecology

## 2012-01-01 DIAGNOSIS — O923 Agalactia: Secondary | ICD-10-CM | POA: Insufficient documentation

## 2012-02-01 ENCOUNTER — Encounter (HOSPITAL_COMMUNITY)
Admission: RE | Admit: 2012-02-01 | Discharge: 2012-02-01 | Disposition: A | Discharge status: Home / Self Care | Payer: Self-pay | Source: Ambulatory Visit | Attending: Obstetrics & Gynecology | Admitting: Obstetrics & Gynecology

## 2012-02-01 DIAGNOSIS — O923 Agalactia: Secondary | ICD-10-CM | POA: Insufficient documentation

## 2012-04-20 ENCOUNTER — Encounter: Payer: Self-pay | Admitting: Internal Medicine

## 2012-05-11 ENCOUNTER — Ambulatory Visit (INDEPENDENT_AMBULATORY_CARE_PROVIDER_SITE_OTHER): Payer: Self-pay | Admitting: Internal Medicine

## 2012-05-11 VITALS — BP 92/62 | HR 65 | Temp 98.2°F | Ht 63.75 in | Wt 183.0 lb

## 2012-05-11 DIAGNOSIS — Z23 Encounter for immunization: Secondary | ICD-10-CM

## 2012-05-11 DIAGNOSIS — Z0000 Encounter for general adult medical examination without abnormal findings: Secondary | ICD-10-CM

## 2012-05-11 DIAGNOSIS — F988 Other specified behavioral and emotional disorders with onset usually occurring in childhood and adolescence: Secondary | ICD-10-CM

## 2012-05-11 NOTE — Assessment & Plan Note (Signed)
Some sx suggestive of ADD but at the same time there seems to be a lot of room for couching Refer to psychology

## 2012-05-11 NOTE — Progress Notes (Signed)
  Subjective:    Patient ID: [name], female    DOB: [birth year], 41 y.o.   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 Patient works long hours, has 2 children, sometimes feels like she is extremely busy and unable to finish all the tasks she needs to do. She did very well in high school and college, is only later in life that she started to have some challenges with finishing tasks. At the same time, she recognizes could prioritize some things better  and get more organize. No nausea, vomiting, diarrhea or blood in the stools No chest pain or shortness of breath, no lower extremity edema Denies depression, some anxiety mostly related to the number of times she is to do daily. Sleeps okay except when her one year old baby needs attention @ night.    Objective:   Physical Exam General -- alert, well-developed,  BMI 31.   Neck --no thyromegaly Lungs -- normal respiratory effort, no intercostal retractions, no accessory muscle use, and normal breath sounds.   Heart-- normal rate, regular rhythm, no murmur, and no gallop.   Abdomen--soft, non-tender, no distention, no masses, no HSM, no guarding, and no rigidity.   Extremities-- no pretibial edema bilaterally Neurologic-- alert & oriented X3 and strength normal in all extremities. Psych-- Cognition and judgment appear intact. Alert and cooperative with normal attention span and concentration.  not anxious appearing and not depressed appearing.       Assessment & Plan:

## 2012-05-11 NOTE — Patient Instructions (Addendum)
Please come back fasting FLP, CBC, CMP, TSH--- dx annual exam I recommended to have a female exam yearly, come back here in 1 or 2 years for a general checkup.

## 2012-05-11 NOTE — Assessment & Plan Note (Signed)
Td 2012 per pt Flu shot today Diet-exercise discussed Labs Female care per gyn

## 2012-05-14 ENCOUNTER — Encounter: Payer: Self-pay | Admitting: Internal Medicine

## 2012-05-15 ENCOUNTER — Ambulatory Visit: Payer: BC Managed Care – PPO

## 2012-05-15 ENCOUNTER — Other Ambulatory Visit (INDEPENDENT_AMBULATORY_CARE_PROVIDER_SITE_OTHER): Payer: BC Managed Care – PPO

## 2012-05-15 DIAGNOSIS — D72829 Elevated white blood cell count, unspecified: Secondary | ICD-10-CM

## 2012-05-15 DIAGNOSIS — Z0000 Encounter for general adult medical examination without abnormal findings: Secondary | ICD-10-CM

## 2012-05-15 LAB — COMPREHENSIVE METABOLIC PANEL
ALT: 14 U/L (ref 0–35)
Albumin: 4.2 g/dL (ref 3.5–5.2)
CO2: 28 mEq/L (ref 19–32)
Calcium: 8.9 mg/dL (ref 8.4–10.5)
Chloride: 104 mEq/L (ref 96–112)
GFR: 86.98 mL/min (ref >60.00)
Glucose, Bld: 77 mg/dL (ref 70–99)
Potassium: 3.5 mEq/L (ref 3.5–5.1)
Sodium: 138 mEq/L (ref 135–145)
Total Bilirubin: 0.4 mg/dL (ref 0.3–1.2)
Total Protein: 7.1 g/dL (ref 6.0–8.3)

## 2012-05-15 LAB — CBC WITH DIFFERENTIAL/PLATELET
Basophils Absolute: 1.1 10*3/uL — ABNORMAL HIGH (ref 0.0–0.1)
Eosinophils Relative: 0.9 % (ref 0.0–5.0)
Lymphocytes Relative: 5.3 % — ABNORMAL LOW (ref 12.0–46.0)
Monocytes Relative: 2.7 % — ABNORMAL LOW (ref 3.0–12.0)
Neutrophils Relative %: 89.4 % — ABNORMAL HIGH (ref 43.0–77.0)
Platelets: 657 10*3/uL — ABNORMAL HIGH (ref 150.0–400.0)
RDW: 16 % — ABNORMAL HIGH (ref 11.5–14.6)
WBC: 61.5 10*3/uL (ref 4.5–10.5)

## 2012-05-15 LAB — LIPID PANEL
HDL: 52.3 mg/dL (ref >39.00)
LDL Cholesterol: 123 mg/dL — ABNORMAL HIGH (ref 0–99)
Total CHOL/HDL Ratio: 4
Triglycerides: 82 mg/dL (ref 0.0–149.0)

## 2012-05-16 ENCOUNTER — Telehealth: Payer: Self-pay

## 2012-05-17 ENCOUNTER — Other Ambulatory Visit (INDEPENDENT_AMBULATORY_CARE_PROVIDER_SITE_OTHER): Payer: BC Managed Care – PPO

## 2012-05-17 ENCOUNTER — Telehealth: Payer: Self-pay | Admitting: Hematology & Oncology

## 2012-05-17 DIAGNOSIS — D72829 Elevated white blood cell count, unspecified: Secondary | ICD-10-CM

## 2012-05-17 LAB — CBC WITH DIFFERENTIAL/PLATELET
Basophils Absolute: 0.9 K/uL — ABNORMAL HIGH (ref 0.0–0.1)
Basophils Relative: 1.3 % (ref 0.0–3.0)
Eosinophils Absolute: 0.5 K/uL (ref 0.0–0.7)
Eosinophils Relative: 0.8 % (ref 0.0–5.0)
HCT: 37.2 % (ref 36.0–46.0)
Hemoglobin: 12 g/dL (ref 12.0–15.0)
Lymphocytes Relative: 5.7 % — ABNORMAL LOW (ref 12.0–46.0)
Lymphs Abs: 3.9 K/uL (ref 0.7–4.0)
MCHC: 32.2 g/dL (ref 30.0–36.0)
MCV: 92.3 fl (ref 78.0–100.0)
Monocytes Absolute: 3 K/uL — ABNORMAL HIGH (ref 0.1–1.0)
Monocytes Relative: 4.3 % (ref 3.0–12.0)
Neutro Abs: 59.9 K/uL — ABNORMAL HIGH (ref 1.4–7.7)
Neutrophils Relative %: 87.9 % — ABNORMAL HIGH (ref 43.0–77.0)
Platelets: 700 K/uL — ABNORMAL HIGH (ref 150.0–400.0)
RBC: 4.03 Mil/uL (ref 3.87–5.11)
RDW: 15.3 % — ABNORMAL HIGH (ref 11.5–14.6)
WBC: 68.1 K/uL (ref 4.5–10.5)

## 2012-05-17 NOTE — Addendum Note (Signed)
Addended by: Edwena Felty T on: 05/17/2012 02:08 PM   Modules accepted: Orders

## 2012-05-17 NOTE — Telephone Encounter (Signed)
Left message with pt to call and schedule appointment 

## 2012-05-21 ENCOUNTER — Telehealth: Payer: Self-pay

## 2012-05-21 ENCOUNTER — Ambulatory Visit (HOSPITAL_BASED_OUTPATIENT_CLINIC_OR_DEPARTMENT_OTHER): Payer: BC Managed Care – PPO | Admitting: Hematology & Oncology

## 2012-05-21 ENCOUNTER — Ambulatory Visit (HOSPITAL_BASED_OUTPATIENT_CLINIC_OR_DEPARTMENT_OTHER): Payer: BC Managed Care – PPO

## 2012-05-21 ENCOUNTER — Other Ambulatory Visit (HOSPITAL_BASED_OUTPATIENT_CLINIC_OR_DEPARTMENT_OTHER): Payer: BC Managed Care – PPO | Admitting: Lab

## 2012-05-21 VITALS — BP 104/58 | HR 66 | Temp 97.8°F | Resp 16 | Ht 64.0 in | Wt 181.0 lb

## 2012-05-21 DIAGNOSIS — D72829 Elevated white blood cell count, unspecified: Secondary | ICD-10-CM

## 2012-05-21 DIAGNOSIS — D473 Essential (hemorrhagic) thrombocythemia: Secondary | ICD-10-CM

## 2012-05-21 DIAGNOSIS — C9210 Chronic myeloid leukemia, BCR/ABL-positive, not having achieved remission: Secondary | ICD-10-CM

## 2012-05-21 LAB — MANUAL DIFFERENTIAL (CHCC SATELLITE)
Band Neutrophils: 16 % — ABNORMAL HIGH (ref 0–10)
Blasts: 0 % (ref 0–0)
Eos: 1 % (ref 0–7)
Other Cells: 0 % (ref 0–0)
Other Comments: 0
PROMYELO: 0 % (ref 0–0)
SEG: 59 % (ref 40–75)
nRBC: 0 % (ref 0–0)

## 2012-05-21 LAB — CBC WITH DIFFERENTIAL (CANCER CENTER ONLY)
HGB: 12 g/dL (ref 11.6–15.9)
MCH: 30.3 pg (ref 26.0–34.0)
Platelets: 818 10*3/uL — ABNORMAL HIGH (ref 145–400)
RBC: 3.96 10*6/uL (ref 3.70–5.32)

## 2012-05-21 LAB — CHCC SATELLITE - SMEAR

## 2012-05-21 LAB — FERRITIN: Ferritin: 145 ng/mL (ref 10–291)

## 2012-05-21 NOTE — Progress Notes (Signed)
This office note has been dictated.

## 2012-05-21 NOTE — Progress Notes (Signed)
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  HISTORY OF PRESENT ILLNESS:  Ms. Mclear is a very charming 41 year old [place] female.  She and her husband are both from [place].  They have been in the [place] for about 10 years.  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 She has 2 children, one 57 years old and one that was 72-year-old.  She sees Dr. Willow Ora.  She has been feeling a little bit tired.  She has had recurring "yeast infections".  She has had no fever.  There has been no weight loss.  Her appetite has been okay.  She has had no cough.  There has been no nausea.  There has been no abdominal pain.  She had some lab work done back on 10/01.  Shockingly, this showed a white cell count of 61,000.  Her hemoglobin was 12 and platelet count was 657.  This was repeated on 10/03.  White cell count was up to 68,000 with a platelet count 700.  We were called regarding Ms. Holdsworth.  We subsequently got her in for an evaluation.  Again, she just feels a little tired.  She has had no rashes.  There has been no pruritus.  There has been no dysphagia or odynophagia.  There is no dyspepsia.  Overall, performance status is ECOG 0.  PAST MEDICAL HISTORY:  Unremarkable.  ALLERGIES:  None.  MEDICATIONS:  Diflucan 150 mg p.o. q.week.  SOCIAL HISTORY:  Negative for tobacco use.  There is very rare alcohol use.  There are no obvious occupational exposures.  Again, she is a first grade teacher.  FAMILY HISTORY:  Remarkable I think for diabetes and hypertension.  REVIEW OF SYSTEMS:  As stated in history of present illness.  No additional findings noted on a 12 system review.  PHYSICAL EXAMINATION:  General:  This is a very charming and well- developed Spanish female in no obvious distress.  She is alert and oriented x3.  Vital signs:  Temperature 97.8, pulse 66, respiratory rate 16, blood pressure 104/58.  Weight is 181.  Head  and neck: Normocephalic, atraumatic skull.  There are no ocular or oral lesions. There are no palpable cervical or supraclavicular lymph nodes.  Lungs: Clear bilaterally.  Cardiac:  Regular rate and rhythm with a normal S1 and S2.  There are no murmurs, rubs or bruits.  Abdomen:  Soft with good bowel sounds.  There is no palpable abdominal mass.  There is no fluid wave.  No palpable hepatosplenomegaly.  Back:  No tenderness of the spine, ribs or hips.  Extremities:  Shows no clubbing, cyanosis or edema.  She has good range of motion of the joints.  Skin:  No rashes, ecchymoses or petechiae.  Neurological:  Shows no focal neurological deficits.  LABORATORY DATA:  White cell count 78, hemoglobin 12, hematocrit 36, platelet count 818,000.  Peripheral smear shows normochromic, normocytic population of red blood cells.  She has no nucleated red blood cells. There are no schistocytes.  There are no target cells.  White cells are increased in number.  She has immature myeloid cells.  There are some myelocytes and metamyelocytes.  I do not see any blasts.  I do not see any promyelocytes.  There are no atypical lymphocytes.  There may be a few hypersegmented polys.  Platelets are increased in number.  She has several large platelets.  IMPRESSION:  Ms. Villaflor is a very charming 41 year old [place]  female. I have to suspect that this is going to end up being chronic myeloid leukemia.  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  I did send off her peripheral blood for BCR/ABL analysis.  I also sent the peripheral blood off for JAK2 assays.  I also sent off the blood for vitamin B12, LDH and ferritin.  Ultimately, Ms. Creary is going to need to have a bone marrow biopsy done. I talked to her and her husband at length about this.  As expected, Ms. Lucatero was very emotional.  She was very worried about not being around for her children.  I tried to reassure her as much as I  could that we have excellent treatments if this is chronic myeloid leukemia.  I reassured her that if this was chronic myeloid leukemia, that all we would have to do is use a pill that works in over 90% of patients.  I am not going to run a bunch of additional studies right now.  I think a bone marrow biopsy really is what we need for the diagnosis.  I spoke with radiology.  I spoke with pathology.  We will get all this set up for this week.  I will be very, very interested to see what her peripheral blood comes back as with respect to the BCR/ABL analysis.  I suppose that this might be polycythemia.  I do not think this would be myelofibrosis given that her spleen is not enlarged.  I do not think this would be essential thrombocythemia given the elevated white cell count.  We will be back in touch with Ms. Hollman once I get the results back from the bone marrow biopsy and cytogenetics.  I spent a good hour and a half with Ms. Rosetti and her husband.  They are both very, very nice.  I gave Ms. Oscar a prayer blanket to give her some comfort knowing that we will be there for her and we will get her through all this.    ______________________________ Josph Macho, M.D. PRE/MEDQ  D:  05/21/2012  T:  05/21/2012  Job:  [id number]

## 2012-05-22 ENCOUNTER — Other Ambulatory Visit: Payer: Self-pay | Admitting: Radiology

## 2012-05-22 NOTE — Telephone Encounter (Signed)
Pt left message on triage line. Returned pt call Left message for pt to call in concerning lab results.      MW

## 2012-05-23 ENCOUNTER — Encounter (HOSPITAL_COMMUNITY): Payer: Self-pay

## 2012-05-23 ENCOUNTER — Ambulatory Visit (HOSPITAL_COMMUNITY)
Admission: RE | Admit: 2012-05-23 | Discharge: 2012-05-23 | Disposition: A | Discharge status: Home / Self Care | Payer: BC Managed Care – PPO | Source: Ambulatory Visit | Attending: Hematology & Oncology | Admitting: Hematology & Oncology

## 2012-05-23 DIAGNOSIS — D473 Essential (hemorrhagic) thrombocythemia: Secondary | ICD-10-CM | POA: Insufficient documentation

## 2012-05-23 DIAGNOSIS — D47Z9 Other specified neoplasms of uncertain behavior of lymphoid, hematopoietic and related tissue: Secondary | ICD-10-CM | POA: Insufficient documentation

## 2012-05-23 DIAGNOSIS — D721 Eosinophilia, unspecified: Secondary | ICD-10-CM | POA: Insufficient documentation

## 2012-05-23 DIAGNOSIS — C9210 Chronic myeloid leukemia, BCR/ABL-positive, not having achieved remission: Secondary | ICD-10-CM

## 2012-05-23 LAB — CBC
HCT: 35.7 % — ABNORMAL LOW (ref 36.0–46.0)
Platelets: 761 10*3/uL — ABNORMAL HIGH (ref 150–400)
RDW: 15.2 % (ref 11.5–15.5)
WBC: 80.5 10*3/uL (ref 4.0–10.5)

## 2012-05-23 LAB — APTT: aPTT: 34 seconds (ref 24–37)

## 2012-05-23 LAB — PROTIME-INR: INR: 1.06 (ref 0.00–1.49)

## 2012-05-23 MED ORDER — MIDAZOLAM HCL 5 MG/5ML IJ SOLN
INTRAMUSCULAR | Status: AC | PRN
  Administered 2012-05-23 (×2): 2 mg via INTRAVENOUS

## 2012-05-23 MED ORDER — FENTANYL CITRATE 0.05 MG/ML IJ SOLN
INTRAMUSCULAR | Status: AC
  Filled 2012-05-23: qty 6

## 2012-05-23 MED ORDER — FENTANYL CITRATE 0.05 MG/ML IJ SOLN
INTRAMUSCULAR | Status: AC | PRN
  Administered 2012-05-23: 100 ug via INTRAVENOUS

## 2012-05-23 MED ORDER — MIDAZOLAM HCL 2 MG/2ML IJ SOLN
INTRAMUSCULAR | Status: AC
  Filled 2012-05-23: qty 4

## 2012-05-23 MED ORDER — SODIUM CHLORIDE 0.9 % IV SOLN
INTRAVENOUS | Status: DC
  Administered 2012-05-23: 08:00:00 via INTRAVENOUS

## 2012-05-23 NOTE — Procedures (Signed)
Successful RT Iliac BM BX AND ASP NO COMP Stable Path pending Full report in PACS

## 2012-05-23 NOTE — Progress Notes (Signed)
Pt is resting comfortably without complaints.

## 2012-05-23 NOTE — H&P (Signed)
[name] is an 41 y.o. female.   Chief Complaint: "I'm here for a bone marrow biopsy" HPI: Pt with history of leukocytosis/thrombocytosis presents today for CT guided bone marrow biopsy.   Past Medical History  Diagnosis Date  . No pertinent past medical history     Past Surgical History  Procedure Date  . Cesarean section 2006    No family history on file. Social History:  reports that she has never smoked. She does not have any smokeless tobacco history on file. She reports that she does not drink alcohol or use illicit drugs.  Allergies: No Known Allergies  Current outpatient prescriptions:fluconazole (DIFLUCAN) 150 MG tablet, 150 mg once a week. , Disp: , Rfl:  Current facility-administered medications:0.9 %  sodium chloride infusion, , Intravenous, Continuous, D Jeananne Rama, PA, Last Rate: 20 mL/hr at 05/23/12 0753    Results for orders placed in visit on 05/21/12 (from the past 48 hour(s))  CBC WITH DIFFERENTIAL ([facility])     Status: Abnormal   Collection Time   05/21/12 11:23 AM      Component Value Range Comment   WBC 78.1 (*) 3.9 - 10.0 10e3/uL    RBC 3.96  3.70 - 5.32 10e6/uL    HGB 12.0  11.6 - 15.9 g/dL    HCT 96.0  45.4 - 09.8 %    MCV 91  81 - 101 fL    MCH 30.3  26.0 - 34.0 pg    MCHC 33.2  32.0 - 36.0 g/dL    RDW 11.9  14.7 - 82.9 %    Platelets 818 (*) 145 - 400 10e3/uL   [facility] - SMEAR     Status: Normal   Collection Time   05/21/12 11:23 AM      Component Value Range Comment   Smear Result Smear Available     LACTATE DEHYDROGENASE     Status: Abnormal   Collection Time   05/21/12 11:23 AM      Component Value Range Comment   LDH 345 (*) 94 - 250 U/L   URIC ACID     Status: Normal   Collection Time   05/21/12 11:23 AM      Component Value Range Comment   Uric Acid, Serum 6.2  2.4 - 7.0 mg/dL   VITAMIN F62     Status: Abnormal   Collection Time   05/21/12 11:23 AM      Component Value Range Comment   Vitamin B-12 >2000 (*)  211 - 911 pg/mL   FERRITIN     Status: Normal   Collection Time   05/21/12 11:23 AM      Component Value Range Comment   Ferritin 145  10 - 291 ng/mL   MANUAL DIFFERENTIAL ([facility])     Status: Abnormal   Collection Time   05/21/12 11:23 AM      Component Value Range Comment   ANC ([facility] manual diff) 67.2 (*) 1.5 - 6.7 10e3/uL    ALC 3.9 (*) 0.6 - 2.2 10e3/uL    SEG 59  40 - 75 %    Band Neutrophils 16 (*) 0 - 10 %    LYMPH 5 (*) 14 - 48 %    MONO 5  0 - 13 %    Eos 1  0 - 7 %    BASO 3 (*) 0 - 2 %    Metamyelocytes 7 (*) 0 - 0 %    Myelocytes 4 (*)  0 - 0 %    PROMYELO 0  0 - 0 %    Blasts 0  0 - 0 %    Variant Lymph 0  0 - 0 %    Other Cells 0  0 - 0 %    nRBC 0  0 - 0 %    Polychromasia Slight  Slight    Tear Drop Cell Occ  Negative    Other Comments 0.00      PLT EST Calvin Increased  Adequate    Results for orders placed during the hospital encounter of 05/23/12  APTT      Component Value Range   aPTT 34  24 - 37 seconds  CBC      Component Value Range   WBC 80.5 (*) 4.0 - 10.5 K/uL   RBC 4.00  3.87 - 5.11 MIL/uL   Hemoglobin 12.1  12.0 - 15.0 g/dL   HCT 21.3 (*) 08.6 - 57.8 %   MCV 89.3  78.0 - 100.0 fL   MCH 30.3  26.0 - 34.0 pg   MCHC 33.9  30.0 - 36.0 g/dL   RDW 46.9  62.9 - 52.8 %   Platelets 761 (*) 150 - 400 K/uL  PROTIME-INR      Component Value Range   Prothrombin Time 13.7  11.6 - 15.2 seconds   INR 1.06  0.00 - 1.49    Review of Systems  Constitutional: Positive for malaise/fatigue. Negative for fever and chills.  Respiratory: Negative for cough and shortness of breath.   Cardiovascular: Negative for chest pain.  Gastrointestinal: Negative for nausea, vomiting and abdominal pain.  Musculoskeletal: Positive for back pain.  Neurological: Positive for headaches.  Endo/Heme/Allergies: Does not bruise/bleed easily.  Psychiatric/Behavioral: The patient is nervous/anxious.    Filed Vitals:   05/23/12 0815  BP: 106/69  Pulse: 68  Temp: 97.8  F (36.6 C)  TempSrc: Oral  Resp: 18  SpO2: 97%    Physical Exam  Constitutional: She is oriented to person, place, and time. She appears well-developed and well-nourished.  Cardiovascular: Normal rate and regular rhythm.   Respiratory: Effort normal and breath sounds normal.  GI: Soft. Bowel sounds are normal. There is no tenderness.  Musculoskeletal: Normal range of motion. She exhibits no edema.  Neurological: She is alert and oriented to person, place, and time.     Assessment/Plan Pt with hx of leukocytosis/thrombocytosis; plan is for CT guided bone marrow biopsy today. Details/risks of procedure d/w pt/husband with their understanding and consent.  Alexie Samson,D KEVIN 05/23/2012, 7:58 AM

## 2012-05-24 ENCOUNTER — Other Ambulatory Visit (HOSPITAL_COMMUNITY): Payer: BC Managed Care – PPO

## 2012-05-28 ENCOUNTER — Other Ambulatory Visit: Payer: Self-pay | Admitting: Hematology & Oncology

## 2012-05-28 DIAGNOSIS — C9210 Chronic myeloid leukemia, BCR/ABL-positive, not having achieved remission: Secondary | ICD-10-CM

## 2012-05-29 ENCOUNTER — Telehealth: Payer: Self-pay | Admitting: Hematology & Oncology

## 2012-05-29 NOTE — Telephone Encounter (Signed)
Pt aware of 10-16 appointment °

## 2012-05-30 ENCOUNTER — Other Ambulatory Visit: Payer: Self-pay

## 2012-05-30 ENCOUNTER — Other Ambulatory Visit (HOSPITAL_BASED_OUTPATIENT_CLINIC_OR_DEPARTMENT_OTHER): Payer: BC Managed Care – PPO | Admitting: Lab

## 2012-05-30 ENCOUNTER — Ambulatory Visit (HOSPITAL_BASED_OUTPATIENT_CLINIC_OR_DEPARTMENT_OTHER): Payer: BC Managed Care – PPO | Admitting: Hematology & Oncology

## 2012-05-30 ENCOUNTER — Encounter: Payer: Self-pay | Admitting: Hematology & Oncology

## 2012-05-30 VITALS — BP 106/66 | HR 75 | Temp 98.0°F | Resp 16 | Ht 64.0 in | Wt 180.0 lb

## 2012-05-30 DIAGNOSIS — C9210 Chronic myeloid leukemia, BCR/ABL-positive, not having achieved remission: Secondary | ICD-10-CM

## 2012-05-30 LAB — COMPREHENSIVE METABOLIC PANEL
ALT: 11 U/L (ref 0–35)
Alkaline Phosphatase: 63 U/L (ref 39–117)
Sodium: 141 mEq/L (ref 135–145)
Total Bilirubin: 0.4 mg/dL (ref 0.3–1.2)
Total Protein: 6.7 g/dL (ref 6.0–8.3)

## 2012-05-30 LAB — MANUAL DIFFERENTIAL (CHCC SATELLITE)
BASO: 3 % — ABNORMAL HIGH (ref 0–2)
LYMPH: 8 % — ABNORMAL LOW (ref 14–48)
MONO: 4 % (ref 0–13)
Metamyelocytes: 9 % — ABNORMAL HIGH (ref 0–0)

## 2012-05-30 LAB — CBC WITH DIFFERENTIAL (CANCER CENTER ONLY)
Platelets: 707 10*3/uL — ABNORMAL HIGH (ref 145–400)
RDW: 15.2 % (ref 11.1–15.7)
WBC: 78.2 10*3/uL (ref 3.9–10.0)

## 2012-05-30 LAB — CHCC SATELLITE - SMEAR

## 2012-05-30 LAB — MAGNESIUM: Magnesium: 1.8 mg/dL (ref 1.5–2.5)

## 2012-05-30 MED ORDER — DASATINIB 100 MG PO TABS: 100.0000 mg | ORAL_TABLET | Freq: Every day | ORAL | Status: DC

## 2012-05-30 NOTE — Progress Notes (Signed)
This office note has been dictated.

## 2012-05-30 NOTE — Patient Instructions (Signed)
SPRYCEL (dasatinib) oral tablet  What is this medicine?  DASATINIB (da SA ti nib) is a chemotherapy drug. It targets specific proteins within cancer cells and stops the cancer cells from growing. It is used to treat some kinds of leukemia. This medicine may be used for other purposes; ask your health care provider or pharmacist if you have questions.  What should I tell my health care provider before I take this medicine?  They need to know if you have any of these conditions: -bleeding problems -heart disease -heart failure -infection (especially a virus infection such as chickenpox, cold sores, or herpes) -irregular heartbeat -liver disease -low blood counts, like low white cell, platelet, or red cell counts -take medicines that treat or prevent blood clots -an unusual or allergic reaction to dasatinib, lactose, other medicines, foods, dyes, or preservatives -pregnant or trying to get pregnant -breast-feeding  How should I use this medicine?  Take this medicine by mouth with a glass of water. Follow the directions on the prescription label.  You can take it with or without food.  Do not cut, crush or chew this medicine.  Take your medicine at regular intervals.  Do not take your medicine more often than directed.  Do not stop taking except on your doctor's advice. Avoid taking antacids containing aluminum, calcium, or magnesium within 2 hours of taking this medicine. You can take such antacids up to 2 hours before or 2 hours after this medicine.  Avoid taking all other medicines that reduce stomach acid.  Overdosage: If you think you have taken too much of this medicine contact a poison control center or emergency room at once. NOTE: This medicine is only for you. Do not share this medicine with others.  What if I miss a dose?  If you miss a dose, take your next scheduled dose at its regular time. Do not take double or extra doses. Talk to your doctor if you are not sure what  to do.  What may interact with this medicine?  Do not take this medicine with any of the following medications: -ergot alkaloids like dihydroergotamine, ergonovine, ergotamine, methylergonovine -pimozide -St. John's Wort -stomach acid blockers like cimetidine, famotidine, ranitidine, or omeprazole  This medicine may also interact with the following medications: -alfentanil -antiviral medicines for HIV or AIDS -aspirin -carbamazepine -certain antibiotics (such as clarithromycin, erythromycin, telithromycin, troleandomycin) -cisapride -cyclosporine -dexamethasone -fentanyl -medicines for cholesterol like atorvastatin -medicines for fungal infections like ketoconazole and itraconazole -phenobarbital -phenytoin -rifampin, rifabutin, or rifapentine -sirolimus -tacrolimus -warfarin Talk to your prescriber or health care professional before taking any of these medicines: -acetaminophen -aspirin -ibuprofen -ketoprofen -naproxen This list may not describe all possible interactions. Give your health care provider a list of all the medicines, herbs, non-prescription drugs, or dietary supplements you use. Also tell them if you smoke, drink alcohol, or use illegal drugs. Some items may interact with your medicine.  What should I watch for while using this medicine?  Visit your doctor for regular check ups. Report any side effects. Continue your course of treatment unless your doctor tells you to stop. You will need blood work done while you are taking this medicine. Call your doctor or health care professional for advice if you get a fever, chills or sore throat, or other symptoms of a cold or flu. Do not treat yourself. This drug decreases your body's ability to fight infections. Try to avoid being around people who are sick. This medicine may increase your risk to bruise or  bleed. Call your doctor or health care professional if you notice any unusual bleeding. Be careful brushing and  flossing your teeth or using a toothpick because you may get an infection or bleed more easily. If you have any dental work done, tell your dentist you are receiving this medicine. Avoid taking products that contain aspirin, acetaminophen, ibuprofen, naproxen, or ketoprofen unless instructed by your doctor. These medicines may hide a fever. Do not become pregnant while taking this medicine. Women should inform their doctor if they wish to become pregnant or think they might be pregnant. There is a potential for serious side effects to an unborn child. Talk to your health care professional or pharmacist for more information. Do not breast-feed an infant while taking this medicine. Males who take this medicine should use a condom to avoid pregnancy in their partner.  What side effects may I notice from receiving this medicine?  Side effects that you should report to your doctor or health care professional as soon as possible: -allergic reactions like skin rash, itching or hives, swelling of the face, lips, or tongue -low blood counts - this medicine may decrease the number of white blood cells, red blood cells, and platelets. You may be at increased risk for infections and bleeding. -signs of infection - fever or chills, cough, sore throat, pain or difficulty passing urine -signs of decreased platelets or bleeding - bruising, pinpoint red spots on the skin, black, tarry stools, blood in the urine, nosebleeds -signs of decreased red blood cells - unusual weakness or tiredness, fainting spells, lightheadedness -breathing problems -dry cough -fast, irregular heartbeat -swelling of the legs or ankles, or other parts of the body -sores or white patches in your mouth or throat -sudden weight gain Side effects that usually do not require medical attention (report to your prescriber or health care professional if they continue or are bothersome): -diarrhea -headache -nausea, vomiting -weak or  tired This list may not describe all possible side effects. Call your doctor for medical advice about side effects. You may report side effects to FDA at 1-800-FDA-1088.  Where should I keep my medicine? Keep out of the reach of children. Store at room temperature between 15 and 30 degrees C (59 and 86 degrees F). Keep tightly closed. Throw away any unused medicine after the expiration date. NOTE: This sheet is a summary. It may not cover all possible information. If you have questions about this medicine, talk to your doctor, pharmacist, or health care provider.  2012, [employer]. (07/29/2008 5:13:11 PM)

## 2012-05-30 NOTE — Progress Notes (Signed)
Pt to start Sprycel 100 mg PO daily per Dr Myna Hidalgo. Obtained permission from the pt to have the rx processed through the [facility] Consent form signed for administration of oral chemotherapy.

## 2012-05-31 ENCOUNTER — Other Ambulatory Visit: Payer: Self-pay | Admitting: Hematology & Oncology

## 2012-05-31 NOTE — Progress Notes (Signed)
CC:   Lauren Ora, MD  DIAGNOSIS:  Chronic phase chronic myelogenous leukemia.  CURRENT THERAPY:  Patient to start Sprycel 100 mg p.o. daily.  INTERIM HISTORY:  [name] comes in for followup.  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  We did do BCR/ABL analysis on her peripheral blood.  She did have the BCR/ABL mutation noted.  She had also had a bone marrow biopsy done; this was done on October 9th.  The bone marrow report ([id number]) showed a hypercellular marrow consistent with a myeloproliferative neoplasm.  Cytogenetics were positive for the [place] chromosome.  FISH studies showed 97.5% cells to contain BCR/ABL fusion.  She does feel a little tired.  Otherwise, she has had no other symptoms.  She is quite anxious over the diagnosis.  I can certainly understand this.  She did have initial studies done when we saw her.  Her LDH was elevated at 345.  His serum uric acid was 6.2.  Vitamin B12 was over 2000.  She has had no fever.  There has been no bony pain.  There has been no leg swelling.  She has had no rashes.  PHYSICAL EXAMINATION:  General:  This is a well-developed, well- nourished Hispanic female in no obvious distress.  Vital Signs: Temperature of 98, pulse 75, respiratory rate 16, blood pressure 106/66. Weight is 180.  Head and Neck Exam:  Shows a normocephalic, atraumatic skull.  There are no ocular or oral lesions.  There are no palpable cervical or supraclavicular lymph nodes.  Lungs:  Clear bilaterally. Cardiac Exam:  Regular rate and rhythm with normal S1 and S2.  There are no murmurs, rubs or bruits.  Abdominal Exam:  Soft with good bowel sounds.  There is no palpable abdominal mass.  There is no fluid wave. There is no palpable hepatosplenomegaly.  Back Exam:  No tenderness over the spine, ribs or hips.  Extremities:  Show no clubbing, cyanosis, or edema.  Neurological Exam:  Shows no focal neurological deficits.  LABORATORY STUDIES:  Show a white cell count of  78, hemoglobin 12.2, hematocrit 38.6, platelet count 707.  Magnesium is 1.8.  EKG shows normal sinus rhythm.  IMPRESSION:  [name] is a very nice 41 year old Hispanic female.  She is originally from [place].  She has chronic phase chronic myelogenous leukemia.  I believe that she will do very well with Sprycel.  I really do not see any adverse factors related to her chronic myelogenous leukemia.  Again, she is very worried about the chronic myelogenous leukemia killing her.  I tried to reassure her that with our new medications for chronic myelogenous leukemia, response rates are over 90%.  Patients often will live many years without having active disease.  I gave her information sheets about Sprycel.  We will have her come back every 2 weeks to have her lab work checked.  I do not see a need for another bone marrow test probably for about a year.  We can check her peripheral blood for the BCR/ABL fusion protein every 3 months.  The next key test will be in 3 months with the BCR/ABL analysis.  I spent about an hour with [name] and her husband.  They are both very, very nice.  They are a good couple.  They have a couple of young kids.  I reassured [name] that I really believe that she would live long enough that she would see her children grow up and get married and have kids themselves.  I told [name] that if the Sprycel does not work or is not tolerated that there are other options that we can utilize.  I think [name] had much more confidence after coming in today.  Hopefully, we will get the Sprycel started on her in a couple days.    ______________________________ Josph Macho, M.D. PRE/MEDQ  D:  05/30/2012  T:  05/31/2012  Job:  [id number]

## 2012-06-04 ENCOUNTER — Other Ambulatory Visit: Payer: Self-pay | Admitting: *Deleted

## 2012-06-04 ENCOUNTER — Telehealth: Payer: Self-pay | Admitting: *Deleted

## 2012-06-04 DIAGNOSIS — C9210 Chronic myeloid leukemia, BCR/ABL-positive, not having achieved remission: Secondary | ICD-10-CM

## 2012-06-04 DIAGNOSIS — R112 Nausea with vomiting, unspecified: Secondary | ICD-10-CM

## 2012-06-04 MED ORDER — ONDANSETRON HCL 8 MG PO TABS: 8.0000 mg | ORAL_TABLET | Freq: Three times a day (TID) | ORAL | Status: DC | PRN

## 2012-06-04 NOTE — Telephone Encounter (Signed)
Pt called to report that she started her Sprycel on 05/31/12. Since then she has had intermittent nausea and banding pressure around her head. She denies having any pain, visual, or gait issues. The pressure and nausea do not seem to be related to each other. Reviewed with Eunice Blase, PA. Probable normal side effects from Sprycel. May take Tylenol for pressure/pain and will prescribe Zofran 8 mg q8h prn for nausea. Will have lab appts changed from q2w to weekly.  Instructions were left on her voicemail. To call back with questions and/or further concerns.

## 2012-06-06 ENCOUNTER — Telehealth: Payer: Self-pay | Admitting: Hematology & Oncology

## 2012-06-06 NOTE — Telephone Encounter (Signed)
Left pt message moved time of 10-24 appointment to 230 pm

## 2012-06-07 ENCOUNTER — Other Ambulatory Visit: Payer: BC Managed Care – PPO | Admitting: Lab

## 2012-06-08 ENCOUNTER — Other Ambulatory Visit (HOSPITAL_BASED_OUTPATIENT_CLINIC_OR_DEPARTMENT_OTHER): Payer: BC Managed Care – PPO | Admitting: Lab

## 2012-06-08 DIAGNOSIS — C9210 Chronic myeloid leukemia, BCR/ABL-positive, not having achieved remission: Secondary | ICD-10-CM

## 2012-06-08 LAB — MANUAL DIFFERENTIAL (CHCC SATELLITE)
ALC: 6.2 10*3/uL — ABNORMAL HIGH (ref 0.6–2.2)
Band Neutrophils: 4 % (ref 0–10)
Eos: 2 % (ref 0–7)
PLT EST ~~LOC~~: INCREASED
Platelet Morphology: NORMAL
SEG: 58 % (ref 40–75)
nRBC: 1 % — ABNORMAL HIGH (ref 0–0)

## 2012-06-08 LAB — CBC WITH DIFFERENTIAL (CANCER CENTER ONLY)
HCT: 33.7 % — ABNORMAL LOW (ref 34.8–46.6)
MCV: 89 fL (ref 81–101)
Platelets: 586 10*3/uL — ABNORMAL HIGH (ref 145–400)
RBC: 3.78 10*6/uL (ref 3.70–5.32)
WBC: 26.8 10*3/uL — ABNORMAL HIGH (ref 3.9–10.0)

## 2012-06-13 ENCOUNTER — Other Ambulatory Visit (HOSPITAL_BASED_OUTPATIENT_CLINIC_OR_DEPARTMENT_OTHER): Payer: BC Managed Care – PPO | Admitting: Lab

## 2012-06-13 DIAGNOSIS — C9210 Chronic myeloid leukemia, BCR/ABL-positive, not having achieved remission: Secondary | ICD-10-CM

## 2012-06-13 LAB — COMPREHENSIVE METABOLIC PANEL
ALT: 19 U/L (ref 0–35)
Albumin: 4.2 g/dL (ref 3.5–5.2)
CO2: 24 mEq/L (ref 19–32)
Calcium: 8.6 mg/dL (ref 8.4–10.5)
Chloride: 105 mEq/L (ref 96–112)
Potassium: 4.1 mEq/L (ref 3.5–5.3)
Sodium: 137 mEq/L (ref 135–145)
Total Protein: 6.6 g/dL (ref 6.0–8.3)

## 2012-06-13 LAB — CBC WITH DIFFERENTIAL (CANCER CENTER ONLY)
BASO%: 1.8 % (ref 0.0–2.0)
EOS%: 2.5 % (ref 0.0–7.0)
HCT: 31.8 % — ABNORMAL LOW (ref 34.8–46.6)
LYMPH#: 1.9 10*3/uL (ref 0.9–3.3)
MCHC: 32.7 g/dL (ref 32.0–36.0)
NEUT#: 5.2 10*3/uL (ref 1.5–6.5)
NEUT%: 65.3 % (ref 39.6–80.0)
Platelets: 439 10*3/uL — ABNORMAL HIGH (ref 145–400)
RDW: 14.9 % (ref 11.1–15.7)

## 2012-06-13 LAB — MAGNESIUM: Magnesium: 1.9 mg/dL (ref 1.5–2.5)

## 2012-06-21 ENCOUNTER — Other Ambulatory Visit (HOSPITAL_BASED_OUTPATIENT_CLINIC_OR_DEPARTMENT_OTHER): Payer: BC Managed Care – PPO | Admitting: Lab

## 2012-06-21 DIAGNOSIS — C9210 Chronic myeloid leukemia, BCR/ABL-positive, not having achieved remission: Secondary | ICD-10-CM

## 2012-06-21 LAB — CBC WITH DIFFERENTIAL (CANCER CENTER ONLY)
BASO%: 0.4 % (ref 0.0–2.0)
EOS%: 2 % (ref 0.0–7.0)
HCT: 31.1 % — ABNORMAL LOW (ref 34.8–46.6)
LYMPH#: 0.9 10*3/uL (ref 0.9–3.3)
LYMPH%: 19.9 % (ref 14.0–48.0)
MCHC: 33.1 g/dL (ref 32.0–36.0)
MCV: 91 fL (ref 81–101)
MONO%: 8.1 % (ref 0.0–13.0)
NEUT%: 69.6 % (ref 39.6–80.0)
RDW: 15 % (ref 11.1–15.7)

## 2012-06-21 LAB — COMPREHENSIVE METABOLIC PANEL
ALT: 18 U/L (ref 0–35)
AST: 18 U/L (ref 0–37)
Calcium: 9 mg/dL (ref 8.4–10.5)
Chloride: 104 mEq/L (ref 96–112)
Creatinine, Ser: 0.72 mg/dL (ref 0.50–1.10)

## 2012-06-25 ENCOUNTER — Other Ambulatory Visit: Payer: Self-pay | Admitting: Hematology & Oncology

## 2012-06-27 ENCOUNTER — Other Ambulatory Visit: Payer: Self-pay | Admitting: *Deleted

## 2012-06-27 ENCOUNTER — Other Ambulatory Visit (HOSPITAL_BASED_OUTPATIENT_CLINIC_OR_DEPARTMENT_OTHER): Payer: BC Managed Care – PPO | Admitting: Lab

## 2012-06-27 DIAGNOSIS — C9210 Chronic myeloid leukemia, BCR/ABL-positive, not having achieved remission: Secondary | ICD-10-CM

## 2012-06-27 LAB — CBC WITH DIFFERENTIAL (CANCER CENTER ONLY)
BASO%: 0.8 % (ref 0.0–2.0)
EOS%: 2.3 % (ref 0.0–7.0)
HCT: 30.6 % — ABNORMAL LOW (ref 34.8–46.6)
LYMPH%: 31.7 % (ref 14.0–48.0)
MCHC: 33.3 g/dL (ref 32.0–36.0)
MCV: 92 fL (ref 81–101)
MONO#: 0.3 10*3/uL (ref 0.1–0.9)
NEUT%: 52.8 % (ref 39.6–80.0)
RDW: 15.7 % (ref 11.1–15.7)

## 2012-06-27 LAB — COMPREHENSIVE METABOLIC PANEL
ALT: 17 U/L (ref 0–35)
AST: 16 U/L (ref 0–37)
Creatinine, Ser: 0.78 mg/dL (ref 0.50–1.10)
Total Bilirubin: 0.4 mg/dL (ref 0.3–1.2)

## 2012-06-27 MED ORDER — DASATINIB 70 MG PO TABS: 70.0000 mg | ORAL_TABLET | Freq: Every day | ORAL | Status: DC

## 2012-06-27 NOTE — Telephone Encounter (Signed)
To decrease dose of Sprycel from 100 mg to 70 mg per day per Dr Myna Hidalgo due to pancytopenia. Pt to treat (afebrile) sore throat with OTC medications but knows if she develops a fever over 100.5 to call the office or go to the ER. Will send via eprescribe to [facility] once approved by him.

## 2012-07-04 ENCOUNTER — Other Ambulatory Visit (HOSPITAL_BASED_OUTPATIENT_CLINIC_OR_DEPARTMENT_OTHER): Payer: BC Managed Care – PPO | Admitting: Lab

## 2012-07-04 DIAGNOSIS — C9210 Chronic myeloid leukemia, BCR/ABL-positive, not having achieved remission: Secondary | ICD-10-CM

## 2012-07-04 LAB — COMPREHENSIVE METABOLIC PANEL
ALT: 12 U/L (ref 0–35)
BUN: 13 mg/dL (ref 6–23)
CO2: 25 mEq/L (ref 19–32)
Calcium: 8.6 mg/dL (ref 8.4–10.5)
Chloride: 102 mEq/L (ref 96–112)
Creatinine, Ser: 0.72 mg/dL (ref 0.50–1.10)
Glucose, Bld: 112 mg/dL — ABNORMAL HIGH (ref 70–99)
Total Bilirubin: 0.4 mg/dL (ref 0.3–1.2)

## 2012-07-04 LAB — CBC WITH DIFFERENTIAL (CANCER CENTER ONLY)
BASO%: 0.9 % (ref 0.0–2.0)
Eosinophils Absolute: 0 10*3/uL (ref 0.0–0.5)
HCT: 31.1 % — ABNORMAL LOW (ref 34.8–46.6)
HGB: 10.4 g/dL — ABNORMAL LOW (ref 11.6–15.9)
LYMPH#: 1.4 10*3/uL (ref 0.9–3.3)
MONO#: 0.3 10*3/uL (ref 0.1–0.9)
NEUT%: 46.3 % (ref 39.6–80.0)
RBC: 3.35 10*6/uL — ABNORMAL LOW (ref 3.70–5.32)
RDW: 15.9 % — ABNORMAL HIGH (ref 11.1–15.7)
WBC: 3.2 10*3/uL — ABNORMAL LOW (ref 3.9–10.0)

## 2012-07-11 ENCOUNTER — Other Ambulatory Visit (HOSPITAL_BASED_OUTPATIENT_CLINIC_OR_DEPARTMENT_OTHER): Payer: BC Managed Care – PPO

## 2012-07-11 ENCOUNTER — Other Ambulatory Visit: Payer: BC Managed Care – PPO | Admitting: Lab

## 2012-07-11 ENCOUNTER — Ambulatory Visit (HOSPITAL_BASED_OUTPATIENT_CLINIC_OR_DEPARTMENT_OTHER): Payer: BC Managed Care – PPO | Admitting: Medical

## 2012-07-11 VITALS — BP 98/55 | HR 60 | Temp 97.6°F | Resp 16 | Ht 64.0 in | Wt 184.0 lb

## 2012-07-11 DIAGNOSIS — D709 Neutropenia, unspecified: Secondary | ICD-10-CM

## 2012-07-11 DIAGNOSIS — D696 Thrombocytopenia, unspecified: Secondary | ICD-10-CM

## 2012-07-11 DIAGNOSIS — C9210 Chronic myeloid leukemia, BCR/ABL-positive, not having achieved remission: Secondary | ICD-10-CM

## 2012-07-11 DIAGNOSIS — R110 Nausea: Secondary | ICD-10-CM

## 2012-07-11 LAB — COMPREHENSIVE METABOLIC PANEL
ALT: 11 U/L (ref 0–35)
CO2: 25 mEq/L (ref 19–32)
Calcium: 9.4 mg/dL (ref 8.4–10.5)
Chloride: 105 mEq/L (ref 96–112)
Creatinine, Ser: 0.63 mg/dL (ref 0.50–1.10)
Glucose, Bld: 88 mg/dL (ref 70–99)
Sodium: 138 mEq/L (ref 135–145)
Total Protein: 7.2 g/dL (ref 6.0–8.3)

## 2012-07-11 LAB — CBC WITH DIFFERENTIAL (CANCER CENTER ONLY)
BASO%: 1.2 % (ref 0.0–2.0)
LYMPH#: 1.2 10*3/uL (ref 0.9–3.3)
MONO#: 0.3 10*3/uL (ref 0.1–0.9)
Platelets: 304 10*3/uL (ref 145–400)
RDW: 15.1 % (ref 11.1–15.7)
WBC: 3.4 10*3/uL — ABNORMAL LOW (ref 3.9–10.0)

## 2012-07-11 NOTE — Progress Notes (Signed)
Diagnosis: Chronic phase chronic myelogenous leukemia.  Current therapy: Sprycel 70 mg by mouth daily.  Interim history: [name] presents today for an office followup visit.  Overall, she, reports, that she's doing quite well.  We did dose reduce her Sprycel from 100 mg down to 70 mg, secondary to neutropenia and thrombocytopenia.  She was also having quite a bit of nausea.  Since she's been on the 70 mg she does not report much, nausea.  Her counts have improved, her white count is 3.4.  Her platelet count is 304,000.  She still continues to report some fatigue, but is able to perform her activities of daily living without any hindrance or decline.  She reports, a good appetite.  She denies any vomiting, any diarrhea, or constipation.  She denies any fevers, chills, or night sweats.  She denies any cough, shortness of breath, or chest pain.  She denies any type of generalized edema.  She does have an occasional headache.  She denies any visual changes, or rashes.  Overall, she has responded quite nicely to the Sprycel.  Review of Systems: Constitutional:Negative for malaise/fatigue, fever, chills, weight loss, diaphoresis, activity change, appetite change, and unexpected weight change.  HEENT: Negative for double vision, blurred vision, visual loss, ear pain, tinnitus, congestion, rhinorrhea, epistaxis sore throat or sinus disease, oral pain/lesion, tongue soreness Respiratory: Negative for cough, chest tightness, shortness of breath, wheezing and stridor.  Cardiovascular: Negative for chest pain, palpitations, leg swelling, orthopnea, PND, DOE or claudication Gastrointestinal: Negative for nausea, vomiting, abdominal pain, diarrhea, constipation, blood in stool, melena, hematochezia, abdominal distention, anal bleeding, rectal pain, anorexia and hematemesis.  Genitourinary: Negative for dysuria, frequency, hematuria,  Musculoskeletal: Negative for myalgias, back pain, joint swelling, arthralgias and  gait problem.  Skin: Negative for rash, color change, pallor and wound.  Neurological:. Negative for dizziness/light-headedness, tremors, seizures, syncope, facial asymmetry, speech difficulty, weakness, numbness, headaches and paresthesias.  Hematological: Negative for adenopathy. Does not bruise/bleed easily.  Psychiatric/Behavioral:  Negative for depression, no loss of interest in normal activity or change in sleep pattern.   Physical Exam: This is a 41 year old, well-developed, well-nourished, Hispanic female, in no obvious distress Vitals: Temperature 97.6 degrees, pulse 60, respirations 16, blood pressure in the 8 or 55.  Weight 184 pounds HEENT reveals a normocephalic, atraumatic skull, no scleral icterus, no oral lesions  Neck is supple without any cervical or supraclavicular adenopathy.  Lungs are clear to auscultation bilaterally. There are no wheezes, rales or rhonci Cardiac is regular rate and rhythm with a normal S1 and S2. There are no murmurs, rubs, or bruits.  Abdomen is soft with good bowel sounds, there is no palpable mass. There is no palpable hepatosplenomegaly. There is no palpable fluid wave.  Musculoskeletal no tenderness of the spine, ribs, or hips.  Extremities there are no clubbing, cyanosis, or edema.  Skin no petechia, purpura or ecchymosis Neurologic is nonfocal.  Laboratory Data: White count 3.4, hemoglobin 11.1, hematocrit 34.2, platelets 304,000  Current Outpatient Prescriptions on File Prior to Visit  Medication Sig Dispense Refill  . dasatinib (SPRYCEL) 70 MG tablet Take 1 tablet (70 mg total) by mouth daily.  30 tablet  2  . ondansetron (ZOFRAN) 8 MG tablet Take 1 tablet (8 mg total) by mouth every 8 (eight) hours as needed for nausea.  20 tablet  3   Assessment/Plan: This is a pleasant, 41 year old, Hispanic female, with the following issues:  #1 chronic myelogenous leukemia.  She is tolerating the Sprycel  at 70 mg daily quite well.  She has had a nice  response to it in regards to her white cell count.  The overall plan, is to have her come back.  Every 2 weeks for lab work.  Every 3 months we will check a BCR./ABL , which will be that key test.  #2.  Intermittent nausea.  She does have Zofran as needed.  #3.  Neutropenia.  We did dose reduce her Sprycel from 100 mg to 70 mg.  Her white count has improved.  We will continue to check labwork on her every 2 weeks.  She has not reported any recent infections.  #4.  Followup.  We will follow back up with [name] in 6 weeks, but before then should there be questions or concerns.

## 2012-07-20 ENCOUNTER — Telehealth: Payer: Self-pay | Admitting: *Deleted

## 2012-07-20 ENCOUNTER — Ambulatory Visit (HOSPITAL_BASED_OUTPATIENT_CLINIC_OR_DEPARTMENT_OTHER): Payer: BC Managed Care – PPO | Admitting: Medical

## 2012-07-20 ENCOUNTER — Other Ambulatory Visit: Payer: BC Managed Care – PPO | Admitting: Lab

## 2012-07-20 ENCOUNTER — Ambulatory Visit (HOSPITAL_BASED_OUTPATIENT_CLINIC_OR_DEPARTMENT_OTHER)
Admission: RE | Admit: 2012-07-20 | Discharge: 2012-07-20 | Disposition: A | Discharge status: Home / Self Care | Payer: BC Managed Care – PPO | Source: Ambulatory Visit | Attending: Hematology & Oncology | Admitting: Hematology & Oncology

## 2012-07-20 ENCOUNTER — Other Ambulatory Visit: Payer: Self-pay | Admitting: Hematology & Oncology

## 2012-07-20 VITALS — BP 106/71 | HR 65 | Temp 97.6°F | Resp 20

## 2012-07-20 DIAGNOSIS — C9210 Chronic myeloid leukemia, BCR/ABL-positive, not having achieved remission: Secondary | ICD-10-CM

## 2012-07-20 DIAGNOSIS — J069 Acute upper respiratory infection, unspecified: Secondary | ICD-10-CM

## 2012-07-20 DIAGNOSIS — R059 Cough, unspecified: Secondary | ICD-10-CM | POA: Insufficient documentation

## 2012-07-20 DIAGNOSIS — R05 Cough: Secondary | ICD-10-CM | POA: Insufficient documentation

## 2012-07-20 LAB — BASIC METABOLIC PANEL - CANCER CENTER ONLY
BUN, Bld: 14 mg/dL (ref 7–22)
CO2: 29 mEq/L (ref 18–33)
Calcium: 9 mg/dL (ref 8.0–10.3)
Creat: 0.8 mg/dl (ref 0.6–1.2)
Glucose, Bld: 97 mg/dL (ref 73–118)

## 2012-07-20 LAB — CBC WITH DIFFERENTIAL (CANCER CENTER ONLY)
BASO%: 0.5 % (ref 0.0–2.0)
EOS%: 0.5 % (ref 0.0–7.0)
LYMPH#: 1.4 10*3/uL (ref 0.9–3.3)
MCHC: 32.7 g/dL (ref 32.0–36.0)
NEUT#: 2.6 10*3/uL (ref 1.5–6.5)
NEUT%: 59.8 % (ref 39.6–80.0)
RDW: 14.7 % (ref 11.1–15.7)

## 2012-07-20 LAB — CHCC SATELLITE - SMEAR

## 2012-07-20 MED ORDER — BENZONATATE 200 MG PO CAPS: 200.0000 mg | ORAL_CAPSULE | Freq: Three times a day (TID) | ORAL | Status: DC | PRN

## 2012-07-20 NOTE — Progress Notes (Signed)
Diagnosis: Chronic phase chronic myelogenous leukemia.  Current therapy: Sprycel 70 mg by mouth daily.  Interim history: [name] presents today as a work in.  She reports, that she is continuing to have some upper respiratory congestion issues.  She reports, that for the past 2, days.  She's had a cough.  She does not report any type of productive sputum.  She reports, that she also has some nasal drainage.  She does not report any type of purulent nasal secretions.  She does not report any fever.  She is not reporting any palpable lymph nodes.  She's not reporting any chest pain, or shortness of breath.  We did get a chest x-ray on her today, which was completely normal, without any abnormalities.  There is no pneumonia.  She still continues to have a good appetite.  She denies any type of nausea, vomiting, diarrhea, constipation.  She denies any mouth as her arthralgias.  She denies any fevers, chills, or night sweats.  She denies any headaches, visual changes, or rashes.  She denies any obvious, or abnormal bleeding.  This really does appear to be more of an upper respiratory virus.  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  Review of Systems: Constitutional:Negative for malaise/fatigue, fever, chills, weight loss, diaphoresis, activity change, appetite change, and unexpected weight change.  HEENT: Negative for double vision, blurred vision, visual loss, ear pain, tinnitus, congestion, rhinorrhea, epistaxis sore throat or sinus disease, oral pain/lesion, tongue soreness Respiratory: Negative for cough, chest tightness, shortness of breath, wheezing and stridor.  Cardiovascular: Negative for chest pain, palpitations, leg swelling, orthopnea, PND, DOE or claudication Gastrointestinal: Negative for nausea, vomiting, abdominal pain, diarrhea, constipation, blood in stool, melena, hematochezia, abdominal distention, anal bleeding, rectal pain, anorexia and  hematemesis.  Genitourinary: Negative for dysuria, frequency, hematuria,  Musculoskeletal: Negative for myalgias, back pain, joint swelling, arthralgias and gait problem.  Skin: Negative for rash, color change, pallor and wound.  Neurological:. Negative for dizziness/light-headedness, tremors, seizures, syncope, facial asymmetry, speech difficulty, weakness, numbness, headaches and paresthesias.  Hematological: Negative for adenopathy. Does not bruise/bleed easily.  Psychiatric/Behavioral:  Negative for depression, no loss of interest in normal activity or change in sleep pattern.   Physical Exam: This is a pleasant, 41 year old, Hispanic female, in no obvious distress Vitals: Temperature 97.6 degrees, pulse 65, respirations 20, blood pressure 106/71 HEENT reveals a normocephalic, atraumatic skull, no scleral icterus, no oral lesions  Neck is supple without any cervical or supraclavicular adenopathy.  Lungs are clear to auscultation bilaterally. There are no wheezes, rales or rhonci Cardiac is regular rate and rhythm with a normal S1 and S2. There are no murmurs, rubs, or bruits.  Abdomen is soft with good bowel sounds, there is no palpable mass. There is no palpable hepatosplenomegaly. There is no palpable fluid wave.  Musculoskeletal no tenderness of the spine, ribs, or hips.  Extremities there are no clubbing, cyanosis, or edema.  Skin no petechia, purpura or ecchymosis Neurologic is nonfocal.  Laboratory Data: White count 4.3, hemoglobin 1.0, hematocrit 33.6, platelets 300,000  Radiology: Chest x-ray from 07/20/2012, revealed normal chest radiographs.  Current Outpatient Prescriptions on File Prior to Visit  Medication Sig Dispense Refill  . dasatinib (SPRYCEL) 70 MG tablet Take 1 tablet (70 mg total) by mouth daily.  30 tablet  2  . ondansetron (ZOFRAN) 8 MG tablet Take 8 mg by mouth as needed.       Assessment/Plan: This is  a pleasant, 41 year old, Hispanic female, with the  following issues:  #1.  Upper respiratory virus.  I did go ahead and give her a Z-Pak.  I instructed her on how to use this.  I also gave her Jerilynn Som for her cough.  I did advise her to get an over-the-counter antihistamine and to help with some of her nasal drainage.  I do suspect, that some of her symptoms are allergy related as well.  I did instruct her that if she should spike a temperature or her symptoms worsen to give our office a call.  #2.  Chronic myelogenous leukemia.  She is tolerating the Sprycel well.  At 70 mg daily.  She's had a nice response in regards to her white cell count.  #3.  Followup.  We will see her back, at her next scheduled appointment, but before then should there be questions or concerns.

## 2012-07-20 NOTE — Telephone Encounter (Signed)
Patient called to tell us that she has had congestion, throat, head, no fever, chest congestion and chest is tight.  Has been going on for about a week. Talked to Dr. Myna Hidalgo.  Wants patient to see Eunice Blase PA and possibly CXR.  Set up per scheduler

## 2012-07-25 ENCOUNTER — Other Ambulatory Visit (HOSPITAL_BASED_OUTPATIENT_CLINIC_OR_DEPARTMENT_OTHER): Payer: BC Managed Care – PPO | Admitting: Lab

## 2012-07-25 DIAGNOSIS — C9210 Chronic myeloid leukemia, BCR/ABL-positive, not having achieved remission: Secondary | ICD-10-CM

## 2012-07-25 LAB — CBC WITH DIFFERENTIAL (CANCER CENTER ONLY)
BASO%: 0.8 % (ref 0.0–2.0)
EOS%: 1.4 % (ref 0.0–7.0)
HCT: 33.7 % — ABNORMAL LOW (ref 34.8–46.6)
LYMPH%: 44 % (ref 14.0–48.0)
MCHC: 32.9 g/dL (ref 32.0–36.0)
MCV: 93 fL (ref 81–101)
MONO#: 0.4 10*3/uL (ref 0.1–0.9)
NEUT%: 43.8 % (ref 39.6–80.0)
RDW: 14.7 % (ref 11.1–15.7)

## 2012-07-25 LAB — COMPREHENSIVE METABOLIC PANEL
ALT: 11 U/L (ref 0–35)
AST: 14 U/L (ref 0–37)
Creatinine, Ser: 0.74 mg/dL (ref 0.50–1.10)
Total Bilirubin: 0.3 mg/dL (ref 0.3–1.2)

## 2012-08-06 ENCOUNTER — Other Ambulatory Visit (HOSPITAL_BASED_OUTPATIENT_CLINIC_OR_DEPARTMENT_OTHER): Payer: BC Managed Care – PPO | Admitting: Lab

## 2012-08-06 DIAGNOSIS — C9210 Chronic myeloid leukemia, BCR/ABL-positive, not having achieved remission: Secondary | ICD-10-CM

## 2012-08-06 LAB — COMPREHENSIVE METABOLIC PANEL
ALT: 12 U/L (ref 0–35)
CO2: 25 mEq/L (ref 19–32)
Creatinine, Ser: 0.72 mg/dL (ref 0.50–1.10)
Total Bilirubin: 0.3 mg/dL (ref 0.3–1.2)

## 2012-08-06 LAB — CBC WITH DIFFERENTIAL (CANCER CENTER ONLY)
BASO#: 0 10*3/uL (ref 0.0–0.2)
BASO%: 0.8 % (ref 0.0–2.0)
Eosinophils Absolute: 0.1 10*3/uL (ref 0.0–0.5)
HCT: 31.2 % — ABNORMAL LOW (ref 34.8–46.6)
HGB: 10.3 g/dL — ABNORMAL LOW (ref 11.6–15.9)
LYMPH%: 37.8 % (ref 14.0–48.0)
MCV: 93 fL (ref 81–101)
MONO#: 0.3 10*3/uL (ref 0.1–0.9)
NEUT%: 51.4 % (ref 39.6–80.0)
RDW: 14.5 % (ref 11.1–15.7)
WBC: 3.7 10*3/uL — ABNORMAL LOW (ref 3.9–10.0)

## 2012-08-07 ENCOUNTER — Other Ambulatory Visit: Payer: BC Managed Care – PPO | Admitting: Lab

## 2012-08-17 ENCOUNTER — Other Ambulatory Visit: Payer: Self-pay | Admitting: *Deleted

## 2012-08-17 DIAGNOSIS — C9210 Chronic myeloid leukemia, BCR/ABL-positive, not having achieved remission: Secondary | ICD-10-CM

## 2012-08-17 MED ORDER — FLUCONAZOLE 200 MG PO TABS: 200.0000 mg | ORAL_TABLET | Freq: Once | ORAL | Status: DC

## 2012-08-17 NOTE — Telephone Encounter (Signed)
Pt called with c/o recurrent yeast infection. GYN wanted her to check with Dr Myna Hidalgo on what she should use as she is taking Sprycel. Reviewed with Dr Myna Hidalgo. To try Diflucan 200 mg x 1. If sx continue, need to have GYN evaluate. She verbalized understanding. Sent via e-rx.

## 2012-08-21 ENCOUNTER — Other Ambulatory Visit: Payer: BC Managed Care – PPO | Admitting: Lab

## 2012-08-21 ENCOUNTER — Ambulatory Visit: Payer: BC Managed Care – PPO | Admitting: Hematology & Oncology

## 2012-08-22 ENCOUNTER — Ambulatory Visit (HOSPITAL_BASED_OUTPATIENT_CLINIC_OR_DEPARTMENT_OTHER): Payer: BC Managed Care – PPO | Admitting: Hematology & Oncology

## 2012-08-22 ENCOUNTER — Other Ambulatory Visit (HOSPITAL_BASED_OUTPATIENT_CLINIC_OR_DEPARTMENT_OTHER): Payer: BC Managed Care – PPO | Admitting: Lab

## 2012-08-22 VITALS — BP 119/67 | HR 79 | Temp 97.9°F | Resp 16 | Ht 64.0 in | Wt 188.0 lb

## 2012-08-22 DIAGNOSIS — C9210 Chronic myeloid leukemia, BCR/ABL-positive, not having achieved remission: Secondary | ICD-10-CM

## 2012-08-22 DIAGNOSIS — J329 Chronic sinusitis, unspecified: Secondary | ICD-10-CM

## 2012-08-22 LAB — CBC WITH DIFFERENTIAL (CANCER CENTER ONLY)
BASO#: 0 10*3/uL (ref 0.0–0.2)
BASO%: 0.6 % (ref 0.0–2.0)
EOS%: 3.4 % (ref 0.0–7.0)
HCT: 36.9 % (ref 34.8–46.6)
LYMPH%: 29.7 % (ref 14.0–48.0)
MCH: 29.9 pg (ref 26.0–34.0)
MCHC: 32.8 g/dL (ref 32.0–36.0)
MCV: 91 fL (ref 81–101)
MONO%: 8.5 % (ref 0.0–13.0)
NEUT%: 57.8 % (ref 39.6–80.0)
RDW: 14 % (ref 11.1–15.7)

## 2012-08-22 LAB — CHCC SATELLITE - SMEAR

## 2012-08-22 LAB — COMPREHENSIVE METABOLIC PANEL
ALT: 12 U/L (ref 0–35)
AST: 16 U/L (ref 0–37)
BUN: 16 mg/dL (ref 6–23)
Creatinine, Ser: 0.7 mg/dL (ref 0.50–1.10)
Total Bilirubin: 0.4 mg/dL (ref 0.3–1.2)

## 2012-08-22 MED ORDER — CEFDINIR 300 MG PO CAPS: 300.0000 mg | ORAL_CAPSULE | Freq: Two times a day (BID) | ORAL | Status: DC

## 2012-08-22 NOTE — Progress Notes (Signed)
This office note has been dictated.

## 2012-08-23 NOTE — Progress Notes (Signed)
CC:   Willow Ora, MD  DIAGNOSIS:  Chronic phase chronic myelogenous leukemia.  CURRENT THERAPY:  Sprycel 70 mg p.o. daily.  INTERIM HISTORY:  Ms. [name] comes in for followup.  She is doing okay. She got through Christmas and New Year's all right.  She was seen back in early December with bronchitis.  She was given a Z-Pak.  We did do a chest x-ray on her.  Chest x-ray was unremarkable.  There was no infiltrates or pleural effusions.  She now has a sinus issue.  She just sounds very congested.  I will go ahead and give her a prescription for Omnicef (600 mg p.o. daily x10 days).  She does have some nasal spray at home.  I told her to take the nasal spray.  As far as the Sprycel is concerned, she is doing well with this.  There has been no side effects from the Sprycel as far as I can tell.  Her liver tests have all looked okay.  PHYSICAL EXAMINATION:  General: This is a well-developed, well- nourished, Hispanic female in no obvious distress.  Vital signs: Temperature of 97.9, pulse 79, respiratory rate 16, blood pressure 119/67.  Weight is 188.  Head and neck:  Normocephalic, atraumatic skull.  There are no ocular or oral lesions.  There are no palpable cervical or supraclavicular lymph nodes.  Lungs:  Clear bilaterally. Cardiac:  Regular rate and rhythm with a normal S1 and S2.  There are no murmurs, rubs or bruits.  Abdomen:  Soft with good bowel sounds.  There is no fluid wave.  There is no palpable abdominal mass.  There is no palpable hepatosplenomegaly.  Extremities:  Shows no clubbing, cyanosis or edema.  Neurological:  Shows no focal neurological deficits.  LABORATORY STUDIES:  White cell count is 3.5, hemoglobin 12.1, hematocrit 36.9, platelet count 152.  Peripheral smear shows good maturation of her white blood cells.  I do not see any immature myeloid or lymphoid cells.  There are a couple hypersegmented polys.  There are no nucleated red cells.  I see no teardrop cells.   There is no rouleaux formation.  Platelets are adequate in number and size.  IMPRESSION:  [name] is a 42 year old, Hispanic female with chronic phase chronic myeloid leukemia.  We are checking her BCR/ABL analysis today.  Hopefully, we will find that she is already in a major molecular remission.  I will have to believe that she is given the marked improvement of her leukocytosis.  We will see how the Omnicef does for this sinusitis issue. We will get her back in 6 weeks for followup. I want to make sure we check her BCR/ABL every 3 months for right now.    ______________________________ Josph Macho, M.D. PRE/MEDQ  D:  08/22/2012  T:  08/23/2012  Job:  [id number]

## 2012-09-13 ENCOUNTER — Other Ambulatory Visit: Payer: Self-pay | Admitting: *Deleted

## 2012-09-13 MED ORDER — FLUCONAZOLE 150 MG PO TABS: 150.0000 mg | ORAL_TABLET | Freq: Every day | ORAL | Status: DC

## 2012-09-13 NOTE — Telephone Encounter (Signed)
Patient called stating she has a recurring yeast infection and no gynecology appt until Feb 12th. Spoke with Eunice Blase PA who prescribed Diflucan.  RX sent to pharmacy

## 2012-10-01 ENCOUNTER — Other Ambulatory Visit: Payer: Self-pay | Admitting: Hematology & Oncology

## 2012-10-03 ENCOUNTER — Ambulatory Visit: Payer: BC Managed Care – PPO | Admitting: Medical

## 2012-10-03 ENCOUNTER — Other Ambulatory Visit: Payer: BC Managed Care – PPO | Admitting: Lab

## 2012-10-03 ENCOUNTER — Other Ambulatory Visit: Payer: Self-pay | Admitting: Hematology & Oncology

## 2012-10-05 ENCOUNTER — Other Ambulatory Visit (HOSPITAL_BASED_OUTPATIENT_CLINIC_OR_DEPARTMENT_OTHER): Payer: BC Managed Care – PPO | Admitting: Lab

## 2012-10-05 ENCOUNTER — Ambulatory Visit (HOSPITAL_BASED_OUTPATIENT_CLINIC_OR_DEPARTMENT_OTHER): Payer: BC Managed Care – PPO | Admitting: Medical

## 2012-10-05 VITALS — BP 111/68 | HR 66 | Temp 97.9°F | Resp 16 | Ht 64.0 in | Wt 185.0 lb

## 2012-10-05 DIAGNOSIS — C9210 Chronic myeloid leukemia, BCR/ABL-positive, not having achieved remission: Secondary | ICD-10-CM

## 2012-10-05 LAB — CBC WITH DIFFERENTIAL (CANCER CENTER ONLY)
BASO%: 0.3 % (ref 0.0–2.0)
EOS%: 1.2 % (ref 0.0–7.0)
HCT: 33.2 % — ABNORMAL LOW (ref 34.8–46.6)
LYMPH#: 1.6 10*3/uL (ref 0.9–3.3)
MCH: 29.8 pg (ref 26.0–34.0)
MCHC: 33.4 g/dL (ref 32.0–36.0)
MONO%: 7.9 % (ref 0.0–13.0)
NEUT#: 1.4 10*3/uL — ABNORMAL LOW (ref 1.5–6.5)
NEUT%: 41.5 % (ref 39.6–80.0)
RDW: 13.9 % (ref 11.1–15.7)

## 2012-10-05 LAB — COMPREHENSIVE METABOLIC PANEL
AST: 16 U/L (ref 0–37)
Albumin: 4.2 g/dL (ref 3.5–5.2)
Alkaline Phosphatase: 57 U/L (ref 39–117)
BUN: 18 mg/dL (ref 6–23)
Creatinine, Ser: 0.71 mg/dL (ref 0.50–1.10)
Potassium: 3.8 mEq/L (ref 3.5–5.3)
Total Bilirubin: 0.3 mg/dL (ref 0.3–1.2)

## 2012-10-05 NOTE — Progress Notes (Signed)
Diagnosis: Chronic phase chronic myelogenous leukemia.  Current therapy: Sprycel 70 mg by mouth daily.  Interim history:  [name] presents today for an office followup visit.  Overall, she, reports, that she's doing relatively well.  The last BCR-ABL analysis, that we checked it back in January, was 1.20%.  In October it was 370.35%.  Overall, she has done quite well on the Sprycel.  Last, time, we saw her.  She did have some sinusitis and was given Omnicef.  This cleared up her sinus infection.  She does report some intermittent yeast infections.  I did advise her to try probiotics for vaginal health.  Really has not reported much toxicity related to the Sprycel.  All of her liver tests have looked okay.  She, reports, a good appetite.  She denies any nausea, vomiting, diarrhea, constipation, chest pain, shortness of breath, or cough.  She denies any fevers, chills, or night sweats.  She denies any obvious, or abnormal bleeding.  She denies any lower leg swelling.  She denies any mucositis.  She denies any type of neuropathy.  She denies any headaches, visual changes, or rashes.  Review of Systems: Constitutional:Negative for malaise/fatigue, fever, chills, weight loss, diaphoresis, activity change, appetite change, and unexpected weight change.  HEENT: Negative for double vision, blurred vision, visual loss, ear pain, tinnitus, congestion, rhinorrhea, epistaxis sore throat or sinus disease, oral pain/lesion, tongue soreness Respiratory: Negative for cough, chest tightness, shortness of breath, wheezing and stridor.  Cardiovascular: Negative for chest pain, palpitations, leg swelling, orthopnea, PND, DOE or claudication Gastrointestinal: Negative for nausea, vomiting, abdominal pain, diarrhea, constipation, blood in stool, melena, hematochezia, abdominal distention, anal bleeding, rectal pain, anorexia and hematemesis.  Genitourinary: Negative for dysuria, frequency, hematuria,  Musculoskeletal:  Negative for myalgias, back pain, joint swelling, arthralgias and gait problem.  Skin: Negative for rash, color change, pallor and wound.  Neurological:. Negative for dizziness/light-headedness, tremors, seizures, syncope, facial asymmetry, speech difficulty, weakness, numbness, headaches and paresthesias.  Hematological: Negative for adenopathy. Does not bruise/bleed easily.  Psychiatric/Behavioral:  Negative for depression, no loss of interest in normal activity or change in sleep pattern.   Physical Exam: This is a pleasant, 42 year old, Hispanic female, in no obvious distress Vitals: Temperature 97.9 degrees, pulse 66, respirations 16, blood pressure 111/68, weight 185 pounds HEENT reveals a normocephalic, atraumatic skull, no scleral icterus, no oral lesions  Neck is supple without any cervical or supraclavicular adenopathy.  Lungs are clear to auscultation bilaterally. There are no wheezes, rales or rhonci Cardiac is regular rate and rhythm with a normal S1 and S2. There are no murmurs, rubs, or bruits.  Abdomen is soft with good bowel sounds, there is no palpable mass. There is no palpable hepatosplenomegaly. There is no palpable fluid wave.  Musculoskeletal no tenderness of the spine, ribs, or hips.  Extremities there are no clubbing, cyanosis, or edema.  Skin no petechia, purpura or ecchymosis Neurologic is nonfocal.  Laboratory Data: White count 3.3, hemoglobin 11.1, hematocrit 33.2, platelets 163,000   Current Outpatient Prescriptions on File Prior to Visit  Medication Sig Dispense Refill  . benzonatate (TESSALON) 200 MG capsule Take 1 capsule (200 mg total) by mouth 3 (three) times daily as needed for cough.  40 capsule  0  . cefdinir (OMNICEF) 300 MG capsule Take 1 capsule (300 mg total) by mouth 2 (two) times daily.  20 capsule  0  . fluconazole (DIFLUCAN) 150 MG tablet Take 1 tablet (150 mg total) by mouth daily. Take one tablet (150  mg) today (09-13-12) and then repeat on day  3 (09/15/12)  2 tablet  0  . ondansetron (ZOFRAN) 8 MG tablet Take 8 mg by mouth as needed.      . SPRYCEL 70 MG tablet TAKE 1 TABLET BY MOUTH DAILY.  30 tablet  2  . SPRYCEL 70 MG tablet TAKE 1 TABLET BY MOUTH DAILY.  30 tablet  2   No current facility-administered medications on file prior to visit.   Assessment/Plan: This is a pleasant, 42 year old, Hispanic female, with the following issues:  #1.  Chronic myelogenous leukemia.  She is tolerating the Sprycel well at 70 mg daily.  She's had a nice response in regards to her white cell count.  Her BCR-ABL is barely detectable.  I imagine the next time, we check this,  she will, hopefully, be in a molecular remission.  We are checking, her BCR-ABL.  Every 3 months.  Her next one will be checked in April.  #2.  Followup.  We will see her back in 6 weeks, but before then should there be questions or concerns.

## 2012-11-19 ENCOUNTER — Other Ambulatory Visit (HOSPITAL_BASED_OUTPATIENT_CLINIC_OR_DEPARTMENT_OTHER): Payer: BC Managed Care – PPO | Admitting: Lab

## 2012-11-19 ENCOUNTER — Ambulatory Visit (HOSPITAL_BASED_OUTPATIENT_CLINIC_OR_DEPARTMENT_OTHER): Payer: BC Managed Care – PPO | Admitting: Hematology & Oncology

## 2012-11-19 VITALS — BP 114/61 | HR 65 | Temp 98.2°F | Resp 16 | Ht 65.0 in | Wt 187.0 lb

## 2012-11-19 DIAGNOSIS — C9210 Chronic myeloid leukemia, BCR/ABL-positive, not having achieved remission: Secondary | ICD-10-CM

## 2012-11-19 DIAGNOSIS — F329 Major depressive disorder, single episode, unspecified: Secondary | ICD-10-CM

## 2012-11-19 LAB — COMPREHENSIVE METABOLIC PANEL
ALT: 12 U/L (ref 0–35)
CO2: 27 mEq/L (ref 19–32)
Creatinine, Ser: 0.78 mg/dL (ref 0.50–1.10)
Glucose, Bld: 97 mg/dL (ref 70–99)
Total Bilirubin: 0.4 mg/dL (ref 0.3–1.2)

## 2012-11-19 LAB — CBC WITH DIFFERENTIAL (CANCER CENTER ONLY)
BASO#: 0 10*3/uL (ref 0.0–0.2)
Eosinophils Absolute: 0 10*3/uL (ref 0.0–0.5)
HCT: 35 % (ref 34.8–46.6)
HGB: 11.6 g/dL (ref 11.6–15.9)
LYMPH%: 46.1 % (ref 14.0–48.0)
MCH: 29.9 pg (ref 26.0–34.0)
MCV: 90 fL (ref 81–101)
MONO#: 0.2 10*3/uL (ref 0.1–0.9)
MONO%: 5.9 % (ref 0.0–13.0)
NEUT%: 46.3 % (ref 39.6–80.0)
RBC: 3.88 10*6/uL (ref 3.70–5.32)
WBC: 3.6 10*3/uL — ABNORMAL LOW (ref 3.9–10.0)

## 2012-11-19 LAB — MAGNESIUM: Magnesium: 1.8 mg/dL (ref 1.5–2.5)

## 2012-11-19 MED ORDER — PAROXETINE HCL 20 MG PO TABS: 20.0000 mg | ORAL_TABLET | ORAL | Status: DC

## 2012-11-19 NOTE — Progress Notes (Signed)
This office note has been dictated.

## 2012-11-20 NOTE — Progress Notes (Signed)
CC:   Lauren Ora, MD  DIAGNOSIS:  Chronic phase CML (chromic myelogenous leukemia).  CURRENT THERAPY:  Sprycel 70 mg p.o. daily.  INTERIM HISTORY:  [name] comes in for followup.  She is doing okay. She is quite emotional.  She is still working.  She is still trying to do a lot of things.  Unfortunately there is some tension between her and her husband.  I am not sure what is going on with that.  She has had no swelling.  She has had no bleeding.  There has been no change in bowel or bladder habits.  When we last checked her BCR/ABL assay it was down to 1.2%.  We are rechecking this today.  She has not noted any problems with nausea or vomiting.  Appetite comes and goes.  Taste for food is not as good.  PHYSICAL EXAMINATION:  General:  This is a well-developed, well- nourished Hispanic female in no obvious distress.  Vital signs:  Show a temperature of 98.2, pulse 65, respiratory rate 16, blood pressure 114/61.  Weight is 187.  Head and neck:  Show normocephalic, atraumatic skull.  There are no ocular or oral lesions.  There are no palpable cervical or supraclavicular lymph nodes.  Lungs:  Clear bilaterally. Cardiac:  Regular rate and rhythm with a normal S1, S2.  There are no murmurs, rubs or bruits.  Abdomen:  Soft with good bowel sounds.  There is no palpable abdominal mass.  There is no palpable hepatosplenomegaly. Extremities:  Show no clubbing, cyanosis or edema.  Neurological:  Shows no focal neurological deficits.  Skin:  No rashes, ecchymoses or petechiae.  LABORATORY STUDIES:  White cell count is 3.6, hemoglobin is 11.6, hematocrit 35, platelet count is 153.  Peripheral smear shows good maturation of her white blood cells.  There is no immature myeloid cells.  There are no blasts.  Red cells are with no nucleated red blood cells.  She has no anisocytosis or poikilocytosis.  Platelets are adequate in number and size.  IMPRESSION:  [name] is a 42 year old Hispanic  female with chronic phase chronic myelogenous leukemia.  I think the main problem now is her emotional lability.  She is quite emotional today.  I think she needs an antidepressant.  I will go ahead and get her on some Paxil.  I think this would be appropriate for her. We will try her on 20 mg a day.  We will go ahead and plan to get her back to see me in another 6 weeks just so we can see how she is doing emotionally.  Again, we sent off the BCR/ABL assay.  Hopefully, this will be less than 1 and maybe nondetectable.  We do not need another BCR/ABL assay for another 3 months.    ______________________________ Josph Macho, M.D. PRE/MEDQ  D:  11/19/2012  T:  11/20/2012  Job:  [id number]

## 2012-12-31 ENCOUNTER — Ambulatory Visit (HOSPITAL_BASED_OUTPATIENT_CLINIC_OR_DEPARTMENT_OTHER): Payer: BC Managed Care – PPO | Admitting: Medical

## 2012-12-31 ENCOUNTER — Ambulatory Visit (HOSPITAL_BASED_OUTPATIENT_CLINIC_OR_DEPARTMENT_OTHER): Payer: BC Managed Care – PPO | Admitting: Lab

## 2012-12-31 ENCOUNTER — Telehealth: Payer: Self-pay | Admitting: Hematology & Oncology

## 2012-12-31 DIAGNOSIS — C9210 Chronic myeloid leukemia, BCR/ABL-positive, not having achieved remission: Secondary | ICD-10-CM

## 2012-12-31 DIAGNOSIS — F329 Major depressive disorder, single episode, unspecified: Secondary | ICD-10-CM

## 2012-12-31 LAB — CBC WITH DIFFERENTIAL (CANCER CENTER ONLY)
BASO#: 0 10*3/uL (ref 0.0–0.2)
Eosinophils Absolute: 0.1 10*3/uL (ref 0.0–0.5)
HCT: 34.7 % — ABNORMAL LOW (ref 34.8–46.6)
HGB: 11.6 g/dL (ref 11.6–15.9)
LYMPH#: 1.7 10*3/uL (ref 0.9–3.3)
MONO#: 0.2 10*3/uL (ref 0.1–0.9)
NEUT%: 45.5 % (ref 39.6–80.0)
RBC: 3.74 10*6/uL (ref 3.70–5.32)
WBC: 3.7 10*3/uL — ABNORMAL LOW (ref 3.9–10.0)

## 2012-12-31 LAB — CMP (CANCER CENTER ONLY)
ALT(SGPT): 16 U/L (ref 10–47)
BUN, Bld: 15 mg/dL (ref 7–22)
CO2: 25 mEq/L (ref 18–33)
Creat: 0.6 mg/dl (ref 0.6–1.2)
Total Bilirubin: 0.6 mg/dl (ref 0.20–1.60)

## 2012-12-31 NOTE — Progress Notes (Signed)
CC:   Lauren Ora, MD  DIAGNOSIS:  Chronic phase CML (chromic myelogenous leukemia).  CURRENT THERAPY:  Sprycel 70 mg p.o. daily.  INTERIM HISTORY:  [name] presents today for an office followup visit.  Overall she reports that she's doing quite well.  When she was seen last, she was given a prescription for Paxil.  Apparently she was going through some issues with her husband.  [name] states that she never took Paxil, and she and her husband are seeing a Veterinary surgeon.  We last checked her BCR-ABL in April, it was not detectable.  She is now in a complete molecular remission.  She does continue to report some fatigue.  She reports that she has a decent appetite.  She denies any nausea, vomiting, diarrhea or constipation.  She denies any fevers, chills or night sweats.  She denies any chest pain, cough, or shortness of breath.  She denies any abdominal pain, any obvious or abnormal bleeding.  She denies any headaches, visual changes or rashes.  She is getting ready to travel out of the country to go back onto [place].  We will go ahead and make sure that she has enough Sprycel to last until she returns back to the states.  She will follow back up with [place] in August when she returns.  Review of Systems: Constitutional:Negative for malaise/fatigue, fever, chills, weight loss, diaphoresis, activity change, appetite change, and unexpected weight change.  HEENT: Negative for double vision, blurred vision, visual loss, ear pain, tinnitus, congestion, rhinorrhea, epistaxis sore throat or sinus disease, oral pain/lesion, tongue soreness Respiratory: Negative for cough, chest tightness, shortness of breath, wheezing and stridor.  Cardiovascular: Negative for chest pain, palpitations, leg swelling, orthopnea, PND, DOE or claudication Gastrointestinal: Negative for nausea, vomiting, abdominal pain, diarrhea, constipation, blood in stool, melena, hematochezia, abdominal distention, anal bleeding, rectal pain, anorexia  and hematemesis.  Genitourinary: Negative for dysuria, frequency, hematuria,  Musculoskeletal: Negative for myalgias, back pain, joint swelling, arthralgias and gait problem.  Skin: Negative for rash, color change, pallor and wound.  Neurological:. Negative for dizziness/light-headedness, tremors, seizures, syncope, facial asymmetry, speech difficulty, weakness, numbness, headaches and paresthesias.  Hematological: Negative for adenopathy. Does not bruise/bleed easily.  Psychiatric/Behavioral:  Negative for depression, no loss of interest in normal activity or change in sleep pattern.   Physical Exam: This is a 42 year old well-developed well-nourished Hispanic female in no obvious distress Vitals: Temperature 90.8 degrees pulse 80 respirations 16 blood pressure 130/73 weight 188 pounds HEENT reveals a normocephalic, atraumatic skull, no scleral icterus, no oral lesions  Neck is supple without any cervical or supraclavicular adenopathy.  Lungs are clear to auscultation bilaterally. There are no wheezes, rales or rhonci Cardiac is regular rate and rhythm with a normal S1 and S2. There are no murmurs, rubs, or bruits.  Abdomen is soft with good bowel sounds, there is no palpable mass. There is no palpable hepatosplenomegaly. There is no palpable fluid wave.  Musculoskeletal no tenderness of the spine, ribs, or hips.  Extremities there are no clubbing, cyanosis, or edema.  Skin no petechia, purpura or ecchymosis Neurologic is nonfocal.  Laboratory Data: White count 3.7 hemoglobin 11.6 hematocrit 34.7 platelet 160,000  Current Outpatient Prescriptions on File Prior to Visit  Medication Sig Dispense Refill  . fluconazole (DIFLUCAN) 150 MG tablet Take 1 tablet (150 mg total) by mouth daily. Take one tablet (150 mg) today (09-13-12) and then repeat on day 3 (09/15/12)  2 tablet  0  . ondansetron (ZOFRAN) 8 MG  tablet Take 8 mg by mouth as needed.      Marland Kitchen PARoxetine (PAXIL) 20 MG tablet Take 1 tablet  (20 mg total) by mouth every morning.  30 tablet  1  . SPRYCEL 70 MG tablet TAKE 1 TABLET BY MOUTH DAILY.  30 tablet  2   No current facility-administered medications on file prior to visit.   Assessment/Plan: This is a pleasant 42 year old female with the following issues:  #1 Chronic phase chronic myelogenous leukemia.  Her BCR-ABL is nondetectable.  She will continue on Sprycel 70 mg daily.  She is tolerating this quite well.  We will recheck her next BCR-ABL in August when she returns.  We checked this every 3 months.  #2.  Emotional lability.  She and her husband are following up with a counselor.  She is not on an antidepressant and does not wish to be at this time.  #3.  Followup.  We will follow back up with [name] in August but before then should there be questions or concerns.

## 2012-12-31 NOTE — Telephone Encounter (Signed)
Gave [name] note that Pt needs sprycel

## 2013-01-23 ENCOUNTER — Ambulatory Visit: Payer: Self-pay | Admitting: Obstetrics & Gynecology

## 2013-01-25 ENCOUNTER — Other Ambulatory Visit: Payer: Self-pay | Admitting: *Deleted

## 2013-01-25 DIAGNOSIS — C9210 Chronic myeloid leukemia, BCR/ABL-positive, not having achieved remission: Secondary | ICD-10-CM

## 2013-01-25 MED ORDER — DASATINIB 70 MG PO TABS
ORAL_TABLET | ORAL | Status: DC
Start: 2013-01-25 — End: 2013-05-15

## 2013-01-25 NOTE — Telephone Encounter (Signed)
Pt called to ask for her Sprycel to be filled earlier as she will be out of the country for 2 months this summer. This was ok'd by Dr Myna Hidalgo. She will work it out with her pharmacy and insurance company if needed.

## 2013-02-28 IMAGING — US US OB FOLLOW-UP
1 series · 14 of 28 positions shown · non-contrast
Comparison: none

[Series 1: us ob follow-up · 0.24mm/px · 14 of 40 slices shown]
[im 2/40]
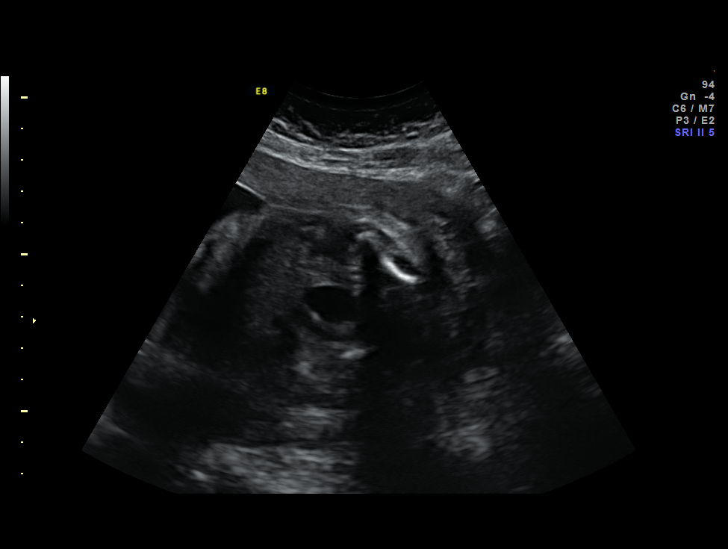
[im 5/40]
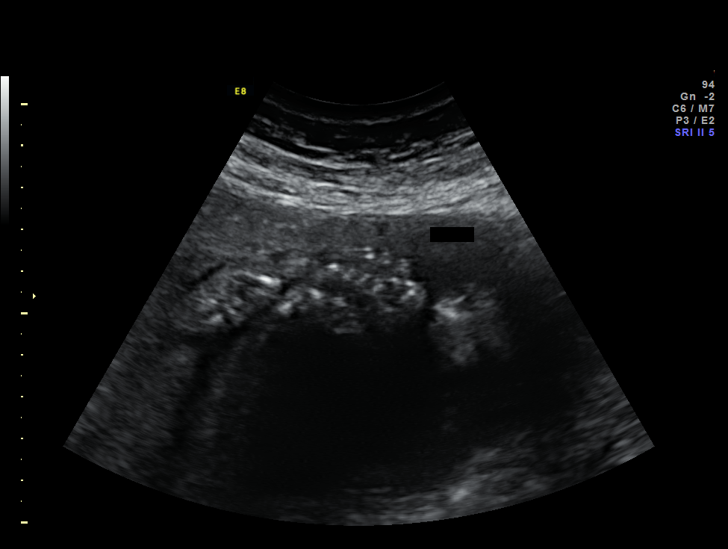
[im 8/40]
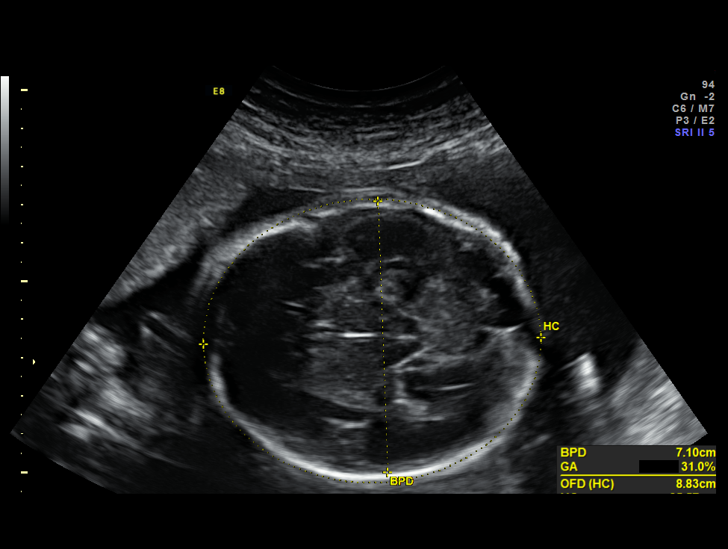
[im 11/40]
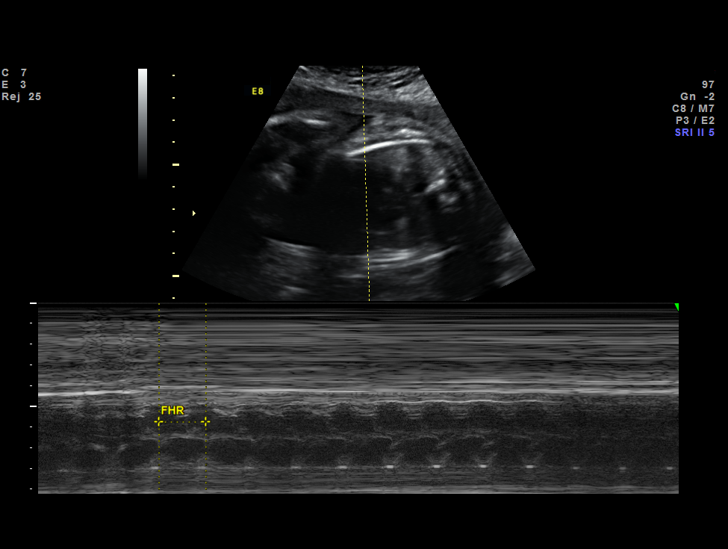
[im 14/40]
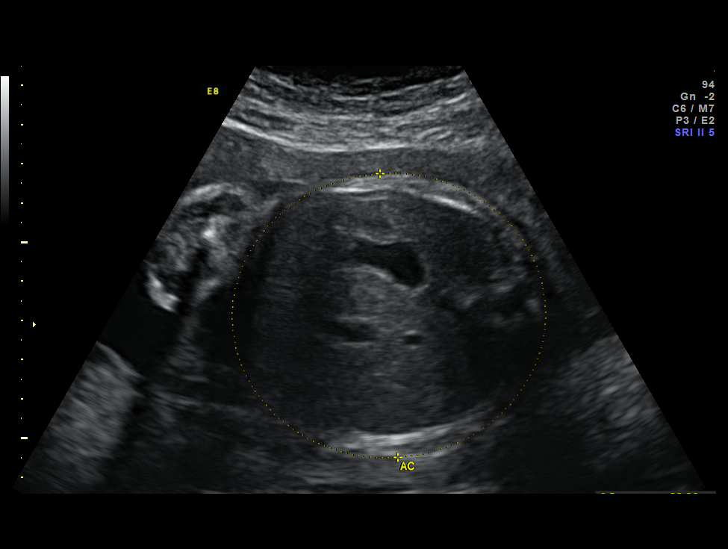
[im 16/40]
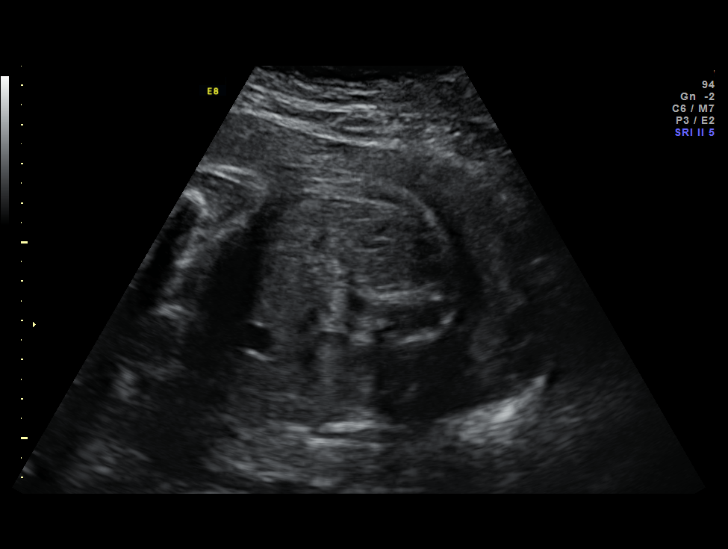
[im 19/40]
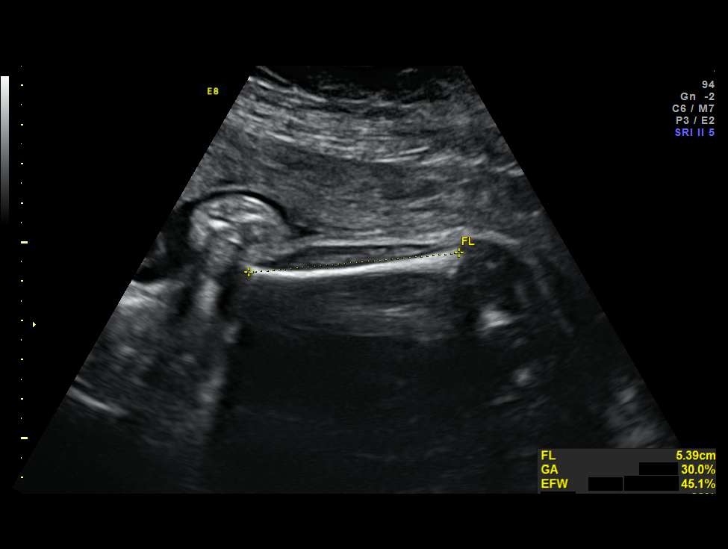
[im 22/40]
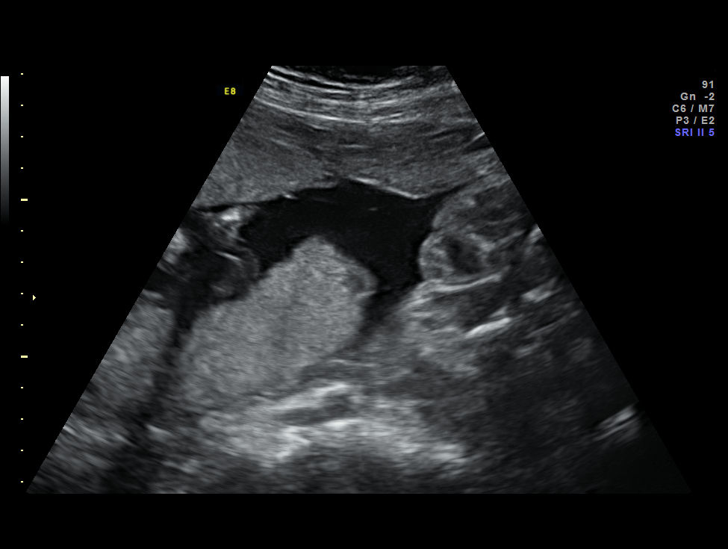
[im 25/40]
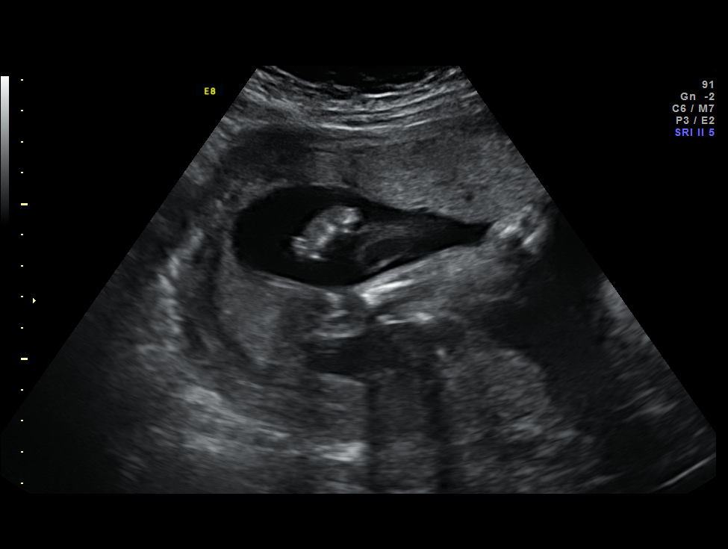
[im 28/40]
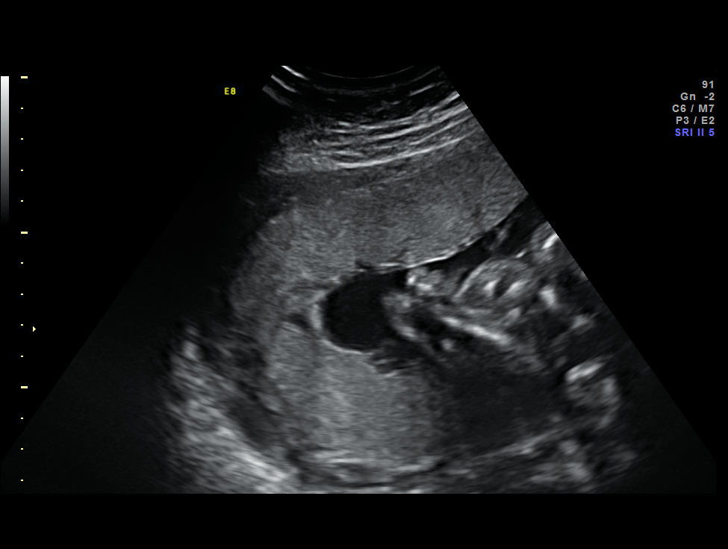
[im 31/40]
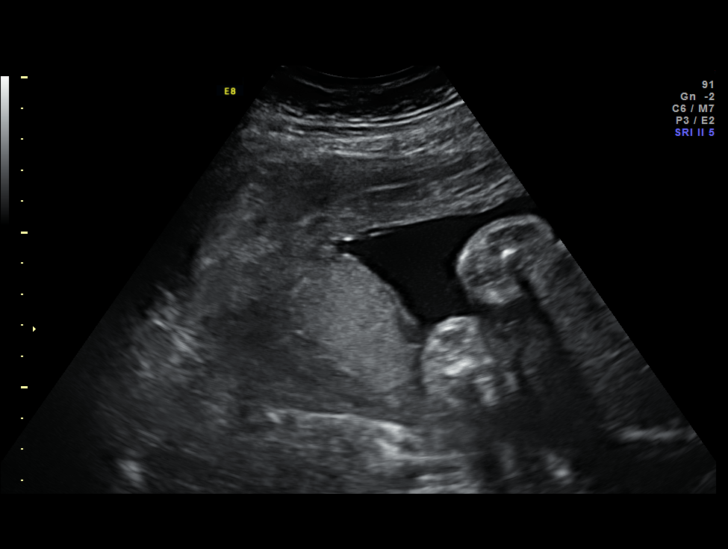
[im 34/40]
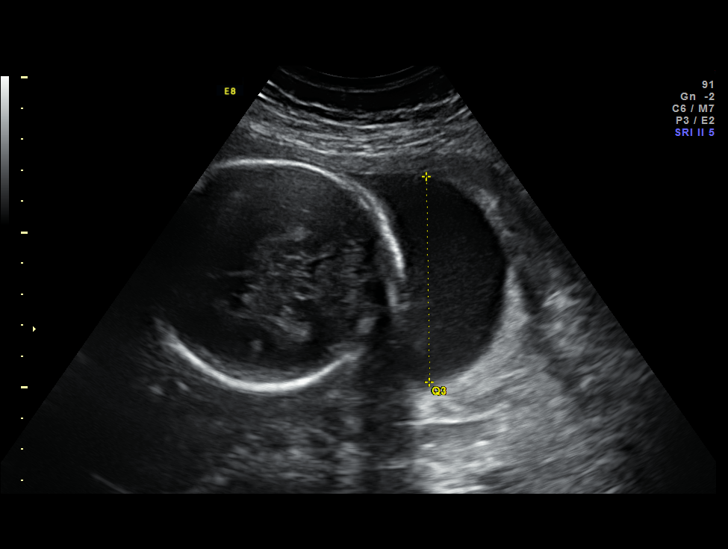
[im 37/40]
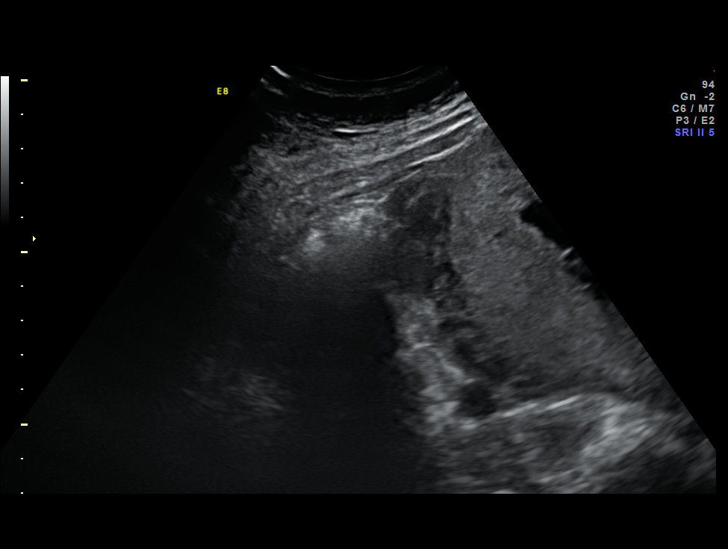
[im 40/40]
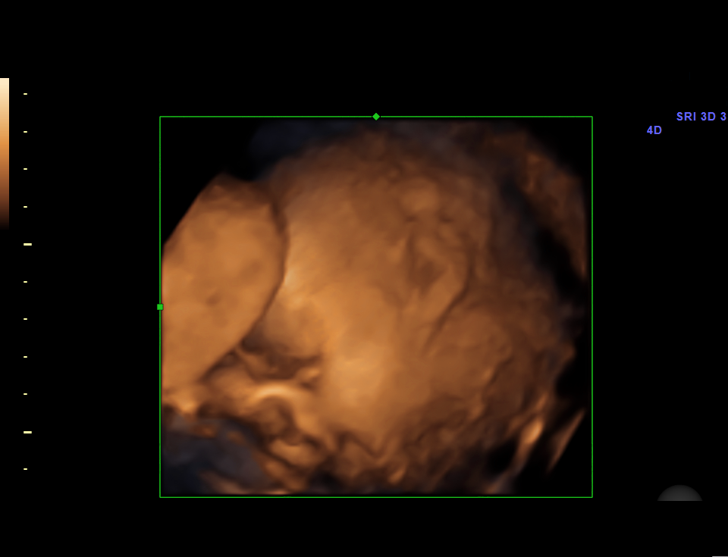

[14 of 28 positions shown; findings below may reference images not displayed]

Canned report from images found in remote index.

Refer to host system for actual result text.

## 2013-03-14 ENCOUNTER — Telehealth: Payer: Self-pay | Admitting: Hematology & Oncology

## 2013-03-14 NOTE — Telephone Encounter (Signed)
Pt aware moved 8-6 to 8-11

## 2013-03-18 ENCOUNTER — Telehealth: Payer: Self-pay | Admitting: Hematology & Oncology

## 2013-03-18 NOTE — Telephone Encounter (Signed)
Pt moved 8-11 to 8-18

## 2013-03-20 ENCOUNTER — Ambulatory Visit: Payer: BC Managed Care – PPO | Admitting: Hematology & Oncology

## 2013-03-20 ENCOUNTER — Other Ambulatory Visit: Payer: BC Managed Care – PPO | Admitting: Lab

## 2013-03-25 ENCOUNTER — Other Ambulatory Visit: Payer: BC Managed Care – PPO | Admitting: Lab

## 2013-03-25 ENCOUNTER — Ambulatory Visit: Payer: BC Managed Care – PPO | Admitting: Hematology & Oncology

## 2013-04-01 ENCOUNTER — Other Ambulatory Visit (HOSPITAL_BASED_OUTPATIENT_CLINIC_OR_DEPARTMENT_OTHER): Payer: BC Managed Care – PPO | Admitting: Lab

## 2013-04-01 ENCOUNTER — Ambulatory Visit (HOSPITAL_BASED_OUTPATIENT_CLINIC_OR_DEPARTMENT_OTHER): Payer: BC Managed Care – PPO | Admitting: Hematology & Oncology

## 2013-04-01 ENCOUNTER — Telehealth: Payer: Self-pay | Admitting: Hematology & Oncology

## 2013-04-01 VITALS — BP 98/56 | HR 61 | Temp 98.0°F | Resp 16 | Ht 65.0 in | Wt 186.0 lb

## 2013-04-01 DIAGNOSIS — C9210 Chronic myeloid leukemia, BCR/ABL-positive, not having achieved remission: Secondary | ICD-10-CM

## 2013-04-01 LAB — CBC WITH DIFFERENTIAL (CANCER CENTER ONLY)
BASO#: 0 10*3/uL (ref 0.0–0.2)
Eosinophils Absolute: 0.1 10*3/uL (ref 0.0–0.5)
HCT: 34.2 % — ABNORMAL LOW (ref 34.8–46.6)
HGB: 11.4 g/dL — ABNORMAL LOW (ref 11.6–15.9)
LYMPH#: 1.4 10*3/uL (ref 0.9–3.3)
LYMPH%: 46.1 % (ref 14.0–48.0)
MCV: 92 fL (ref 81–101)
MONO#: 0.2 10*3/uL (ref 0.1–0.9)
NEUT%: 43.7 % (ref 39.6–80.0)
RBC: 3.7 10*6/uL (ref 3.70–5.32)
WBC: 2.9 10*3/uL — ABNORMAL LOW (ref 3.9–10.0)

## 2013-04-01 LAB — COMPREHENSIVE METABOLIC PANEL
ALT: 11 U/L (ref 0–35)
AST: 14 U/L (ref 0–37)
Alkaline Phosphatase: 49 U/L (ref 39–117)
Calcium: 8.8 mg/dL (ref 8.4–10.5)
Chloride: 103 mEq/L (ref 96–112)
Creatinine, Ser: 0.87 mg/dL (ref 0.50–1.10)
Potassium: 3.7 mEq/L (ref 3.5–5.3)

## 2013-04-01 LAB — CHCC SATELLITE - SMEAR

## 2013-04-01 NOTE — Telephone Encounter (Signed)
[name] said they will call pt for 10-9 BMBX, pt is aware

## 2013-04-01 NOTE — Progress Notes (Signed)
This office note has been dictated.

## 2013-04-02 ENCOUNTER — Ambulatory Visit (INDEPENDENT_AMBULATORY_CARE_PROVIDER_SITE_OTHER): Payer: BC Managed Care – PPO | Admitting: Internal Medicine

## 2013-04-02 ENCOUNTER — Encounter: Payer: Self-pay | Admitting: Internal Medicine

## 2013-04-02 VITALS — BP 110/70 | HR 70 | Temp 98.1°F | Ht 65.0 in | Wt 187.0 lb

## 2013-04-02 DIAGNOSIS — F32A Depression, unspecified: Secondary | ICD-10-CM

## 2013-04-02 DIAGNOSIS — F411 Generalized anxiety disorder: Secondary | ICD-10-CM

## 2013-04-02 DIAGNOSIS — F419 Anxiety disorder, unspecified: Secondary | ICD-10-CM

## 2013-04-02 DIAGNOSIS — Z0000 Encounter for general adult medical examination without abnormal findings: Secondary | ICD-10-CM

## 2013-04-02 DIAGNOSIS — F329 Major depressive disorder, single episode, unspecified: Secondary | ICD-10-CM | POA: Insufficient documentation

## 2013-04-02 HISTORY — DX: Depression, unspecified: F32.A

## 2013-04-02 HISTORY — DX: Anxiety disorder, unspecified: F41.9

## 2013-04-02 LAB — TSH: TSH: 1.83 u[IU]/mL (ref 0.35–5.50)

## 2013-04-02 LAB — LIPID PANEL
Total CHOL/HDL Ratio: 3
VLDL: 9.8 mg/dL (ref 0.0–40.0)

## 2013-04-02 NOTE — Progress Notes (Signed)
CC:   Lauren Ora, MD  DIAGNOSIS:  Chronic phase CML.  CURRENT THERAPY:  Sprycel 70 mg p.o. q. day.  INTERIM HISTORY:  [name] comes in for followup.  She is doing okay. She and her family went down to [place] for some of the summer.  She had a good time while she was down there.  She is getting ready to teach, I think, kindergarten next week.  She has had no problems with the Sprycel.  There is no diarrhea.  She is certainly much more perkier than when I last saw her.  Her last BCR/ABL analysis was nondetectable.  I think this was back in May.  She has been on Sprycel now for close to a year.  PHYSICAL EXAMINATION:  General:  This is a well-developed, well- nourished Hispanic female in no obvious distress.  Vital signs:  Show a temperature of 98, pulse 61, respiratory rate 16, blood pressure 98/56. Weight is 186.  Head and neck:  Shows a normocephalic, atraumatic skull. There are no ocular or oral lesions.  She has no scleral icterus.  There is no adenopathy in the neck.  Lungs:  Clear bilaterally.  Cardiac: Regular rate and rhythm with a normal S1, S2.  There are no murmurs, rubs or bruits.  Abdomen:  Soft.  She has good bowel sounds.  There is no fluid wave.  There is no palpable hepatosplenomegaly.  Extremities: Show no clubbing, cyanosis, or edema.  Neurological:  Shows no focal neurological deficits.  LABORATORY STUDIES:  White cell count is 2.9, hemoglobin 11.4, hematocrit 34.2, platelet count 148.  Peripheral smear shows good maturation of her white blood cells.  I see no immature myeloid cells.  There are no blasts.  Red cells appear normal in morphology maturation.  She has no nucleated red blood cells. Platelets are adequate in number and size.  IMPRESSION:  [name] is a very charming 42 year old Hispanic female with chronic phase CML.  Again, she has been on it for about a year.  We will go ahead and plan for a bone marrow biopsy on her in October.  I think this  would be necessary so that we can further confirm that she is in a major molecular remission.  I will plan to get [name] back after her bone marrow biopsy is done.  I do not think we need any blood work in between visits.  Her blood counts really are holding steady.    ______________________________ [name], M.D. PRE/MEDQ  D:  04/01/2013  T:  04/02/2013  Job:  [id number]

## 2013-04-02 NOTE — Patient Instructions (Addendum)
Get your blood work before you leave  Next visit in  1 year for a physical exam Please make an appointment before you leave the office today (or call few weeks in advance)     

## 2013-04-02 NOTE — Assessment & Plan Note (Signed)
Patient was diagnosed with CML last year, obviously concerned and stressed about the diagnoses,  also she has a stressful job. Previously Dr. Myna Hidalgo prescribed Paxil but the patient declined to take due to potential for side effects.  The patient is counseled to the best of my ability, she is very strong and is handling anxiety well. I asked her not to rule out medications completely and encouraged her to call me if the need arise. I also encouraged her to think about counseling.

## 2013-04-02 NOTE — Progress Notes (Signed)
  Subjective:    Patient ID: [name], female    DOB: 1970/09/22, 42 y.o.   MRN: [id number]  HPI CPX   Past Medical History  Diagnosis Date  . CML (chronic myelocytic leukemia) 2013  . 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 Cesarean section  2006 & 2012  . 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 Periods slightly irregular lately.     Objective:   Physical Exam BP 110/70  Pulse 70  Temp(Src) 98.1 F (36.7 C) (Oral)  Ht 5\' 5"  (1.651 m)  Wt 187 lb (84.823 kg)  BMI 31.12 kg/m2  SpO2 98% General -- alert, well-developed,.  Neck --no thyromegaly Lungs -- normal respiratory effort, no intercostal retractions, no accessory muscle use, and normal breath sounds.  Heart-- normal rate, regular rhythm, no murmur.  Abdomen-- Not distended, Good bowel sounds,soft, non-tender.No mass or  Organomegaly.No rebound or rigidity.  Extremities-- no pretibial edema bilaterally  Neurologic-- alert & oriented X3. Speech, gait normal.  Psych-- Cognition and judgment appear intact. Alert and cooperative with normal attention span and concentration. Emotional during the visit when we talk about CML     Assessment & Plan:

## 2013-04-02 NOTE — Assessment & Plan Note (Signed)
Td 2012 per pt Diet-exercise discussed Labs Female care per gyn

## 2013-04-03 ENCOUNTER — Encounter: Payer: Self-pay | Admitting: *Deleted

## 2013-04-17 ENCOUNTER — Other Ambulatory Visit (HOSPITAL_COMMUNITY): Payer: BC Managed Care – PPO

## 2013-04-25 ENCOUNTER — Other Ambulatory Visit: Payer: Self-pay | Admitting: *Deleted

## 2013-04-25 DIAGNOSIS — B373 Candidiasis of vulva and vagina: Secondary | ICD-10-CM

## 2013-04-25 MED ORDER — FLUCONAZOLE 150 MG PO TABS: 150.0000 mg | ORAL_TABLET | ORAL | Status: AC

## 2013-04-26 ENCOUNTER — Encounter: Payer: Self-pay | Admitting: Obstetrics & Gynecology

## 2013-04-29 ENCOUNTER — Other Ambulatory Visit: Payer: Self-pay | Admitting: Hematology & Oncology

## 2013-05-14 ENCOUNTER — Other Ambulatory Visit: Payer: Self-pay | Admitting: Radiology

## 2013-05-15 ENCOUNTER — Encounter (HOSPITAL_COMMUNITY): Payer: Self-pay | Admitting: Pharmacy Technician

## 2013-05-17 ENCOUNTER — Ambulatory Visit (HOSPITAL_COMMUNITY)
Admission: RE | Admit: 2013-05-17 | Discharge: 2013-05-17 | Disposition: A | Discharge status: Home / Self Care | Payer: BC Managed Care – PPO | Source: Ambulatory Visit | Attending: Hematology & Oncology | Admitting: Hematology & Oncology

## 2013-05-17 ENCOUNTER — Encounter (HOSPITAL_COMMUNITY): Payer: Self-pay

## 2013-05-17 DIAGNOSIS — D696 Thrombocytopenia, unspecified: Secondary | ICD-10-CM | POA: Insufficient documentation

## 2013-05-17 DIAGNOSIS — D72819 Decreased white blood cell count, unspecified: Secondary | ICD-10-CM | POA: Insufficient documentation

## 2013-05-17 DIAGNOSIS — C9210 Chronic myeloid leukemia, BCR/ABL-positive, not having achieved remission: Secondary | ICD-10-CM | POA: Insufficient documentation

## 2013-05-17 LAB — PROTIME-INR
INR: 0.96 (ref 0.00–1.49)
Prothrombin Time: 12.6 seconds (ref 11.6–15.2)

## 2013-05-17 LAB — CBC
HCT: 35.3 % — ABNORMAL LOW (ref 36.0–46.0)
Hemoglobin: 12.1 g/dL (ref 12.0–15.0)
RDW: 13.4 % (ref 11.5–15.5)
WBC: 3.2 10*3/uL — ABNORMAL LOW (ref 4.0–10.5)

## 2013-05-17 LAB — BONE MARROW EXAM

## 2013-05-17 LAB — APTT: aPTT: 28 seconds (ref 24–37)

## 2013-05-17 MED ORDER — HYDROCODONE-ACETAMINOPHEN 5-325 MG PO TABS
1.0000 | ORAL_TABLET | ORAL | Status: DC | PRN
  Filled 2013-05-17: qty 2

## 2013-05-17 MED ORDER — MIDAZOLAM HCL 2 MG/2ML IJ SOLN
INTRAMUSCULAR | Status: AC
  Filled 2013-05-17: qty 6

## 2013-05-17 MED ORDER — FENTANYL CITRATE 0.05 MG/ML IJ SOLN
INTRAMUSCULAR | Status: AC | PRN
  Administered 2013-05-17: 50 ug via INTRAVENOUS
  Administered 2013-05-17: 100 ug via INTRAVENOUS

## 2013-05-17 MED ORDER — FENTANYL CITRATE 0.05 MG/ML IJ SOLN
INTRAMUSCULAR | Status: AC
  Filled 2013-05-17: qty 6

## 2013-05-17 MED ORDER — MIDAZOLAM HCL 2 MG/2ML IJ SOLN
INTRAMUSCULAR | Status: AC | PRN
  Administered 2013-05-17: 1 mg via INTRAVENOUS

## 2013-05-17 MED ORDER — SODIUM CHLORIDE 0.9 % IV SOLN: Freq: Once | INTRAVENOUS | Status: DC

## 2013-05-17 NOTE — H&P (Signed)
[name] is an 42 y.o. female.   Chief Complaint: "I'm having another bone marrow biopsy" HPI: Patient with history of chronic phase CML presents today for CT guided bone marrow biopsy to confirm remission.  Past Medical History  Diagnosis Date  . CML (chronic myelocytic leukemia) 2013  . 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 Cesarean section  2006 & 2012  . Tubal ligation      History reviewed. No pertinent family history. Social History:  reports that she has never smoked. She does not have any smokeless tobacco history on file. She reports that she does not drink alcohol or use illicit drugs.  Allergies: No Known Allergies  Current outpatient prescriptions:dasatinib (SPRYCEL) 70 MG tablet, Take 70 mg by mouth daily., Disp: , Rfl: ;  Multiple Vitamin (MULTIVITAMIN WITH MINERALS) TABS tablet, Take 1 tablet by mouth daily., Disp: , Rfl:  Current facility-administered medications:0.9 %  sodium chloride infusion, , Intravenous, Once, Berneta Levins, PA-C   Results for orders placed during the hospital encounter of 05/17/13 (from the past 48 hour(s))  CBC     Status: Abnormal   Collection Time    05/17/13  9:30 AM      Result Value Range   WBC 3.2 (*) 4.0 - 10.5 K/uL   RBC 3.93  3.87 - 5.11 MIL/uL   Hemoglobin 12.1  12.0 - 15.0 g/dL   HCT 40.9 (*) 81.1 - 91.4 %   MCV 89.8  78.0 - 100.0 fL   MCH 30.8  26.0 - 34.0 pg   MCHC 34.3  30.0 - 36.0 g/dL   RDW 78.2  95.6 - 21.3 %   Platelets 147 (*) 150 - 400 K/uL   No results found.  Review of Systems  Constitutional: Negative for fever and chills.  Respiratory: Negative for cough and shortness of breath.   Gastrointestinal: Negative for nausea, vomiting and abdominal pain.  Musculoskeletal: Negative for back pain.  Neurological: Negative for headaches.  Endo/Heme/Allergies: Does not bruise/bleed easily.   Vitals: BP 115/64  HR 81  R 16  TEMP 98  O2  SATS 100% RA Physical Exam  Constitutional: She is oriented to person, place, and time. She appears well-developed and well-nourished.  Cardiovascular: Normal rate and regular rhythm.   Respiratory: Effort normal and breath sounds normal.  GI: Soft. Bowel sounds are normal. There is no tenderness.  Musculoskeletal: Normal range of motion. She exhibits no edema.  Neurological: She is alert and oriented to person, place, and time.     Assessment/Plan Pt with hx of chronic phase CML. Plan is for CT guided bone marrow biopsy today to confirm remission. Details/risks of procedure d/w pt/husband with their understanding and consent.  ALLRED,D KEVIN 05/17/2013, 9:46 AM

## 2013-05-17 NOTE — Procedures (Signed)
CT-guided  R iliac bone marrow aspiration and core biopsy No complication No blood loss. See complete dictation in [facility] PACS  

## 2013-05-23 ENCOUNTER — Telehealth: Payer: Self-pay | Admitting: Hematology & Oncology

## 2013-05-23 NOTE — Telephone Encounter (Signed)
I left a message on her cell phone that the bone marrow test came out perfect!!  NO evidence of CML!!!  Cindee Lame

## 2013-06-03 ENCOUNTER — Encounter: Payer: Self-pay | Admitting: Hematology & Oncology

## 2013-06-03 LAB — CHROMOSOME ANALYSIS, BONE MARROW

## 2013-06-03 LAB — TISSUE HYBRIDIZATION (BONE MARROW)-NCBH

## 2013-06-12 ENCOUNTER — Encounter: Payer: Self-pay | Admitting: Hematology & Oncology

## 2013-06-14 ENCOUNTER — Ambulatory Visit (HOSPITAL_BASED_OUTPATIENT_CLINIC_OR_DEPARTMENT_OTHER): Payer: BC Managed Care – PPO | Admitting: Hematology & Oncology

## 2013-06-14 ENCOUNTER — Other Ambulatory Visit (HOSPITAL_BASED_OUTPATIENT_CLINIC_OR_DEPARTMENT_OTHER): Payer: BC Managed Care – PPO | Admitting: Lab

## 2013-06-14 VITALS — BP 124/58 | HR 71 | Temp 98.3°F | Resp 14 | Ht 67.0 in | Wt 188.0 lb

## 2013-06-14 DIAGNOSIS — C9210 Chronic myeloid leukemia, BCR/ABL-positive, not having achieved remission: Secondary | ICD-10-CM

## 2013-06-14 DIAGNOSIS — Z9289 Personal history of other medical treatment: Secondary | ICD-10-CM

## 2013-06-14 DIAGNOSIS — B373 Candidiasis of vulva and vagina: Secondary | ICD-10-CM

## 2013-06-14 LAB — CMP (CANCER CENTER ONLY)
ALT(SGPT): 12 U/L (ref 10–47)
AST: 19 U/L (ref 11–38)
Calcium: 8.9 mg/dL (ref 8.0–10.3)
Chloride: 105 mEq/L (ref 98–108)
Creat: 0.9 mg/dl (ref 0.6–1.2)
Potassium: 3.7 mEq/L (ref 3.3–4.7)
Total Protein: 7.1 g/dL (ref 6.4–8.1)

## 2013-06-14 LAB — CBC WITH DIFFERENTIAL (CANCER CENTER ONLY)
BASO%: 0.5 % (ref 0.0–2.0)
EOS%: 1.2 % (ref 0.0–7.0)
HCT: 33.4 % — ABNORMAL LOW (ref 34.8–46.6)
LYMPH#: 1.5 10*3/uL (ref 0.9–3.3)
LYMPH%: 35.8 % (ref 14.0–48.0)
MCH: 30.6 pg (ref 26.0–34.0)
MCV: 92 fL (ref 81–101)
MONO#: 0.2 10*3/uL (ref 0.1–0.9)
NEUT#: 2.4 10*3/uL (ref 1.5–6.5)
Platelets: 161 10*3/uL (ref 145–400)
RDW: 13 % (ref 11.1–15.7)
WBC: 4.2 10*3/uL (ref 3.9–10.0)

## 2013-06-14 LAB — MAGNESIUM: Magnesium: 1.6 mg/dL (ref 1.5–2.5)

## 2013-06-14 MED ORDER — FLUCONAZOLE 100 MG PO TABS: ORAL_TABLET | ORAL | Status: DC

## 2013-06-14 NOTE — Progress Notes (Signed)
This office note has been dictated.

## 2013-06-15 NOTE — Progress Notes (Signed)
CC:   Willow Ora, MD  DATE OF BIRTH:  June 08, 1971.  DIAGNOSIS:  Chronic phase chronic myeloid leukemia, major molecular remission.  CURRENT THERAPY:  Sprycel 70 mg p.o. q. day.  INTERIM HISTORY:  Ms. Ignasiak comes in for a followup.  We did go ahead and have a bone marrow test done on her.  This was done on October 3rd.  The pathology report ([id number]) showed a normal cellular bone marrow. There is no evidence of chronic myeloid leukemia.  Cytogenetics and FISH were all negative for the [place] chromosome.  Her last BCR-ABL analysis that was done on August 18th also showed that there is no detectable BCR-ABL transcripts.  She has done incredibly well.  The only issue that she has is recurrent yeast infections in the "female area."  I am not sure why she has these. I went ahead and gave her a prescription for Diflucan to take if needed.  She has had no problems with cough or shortness of breath.  There has been no rashes.  She has had no nausea or vomiting.  There has been no change in bowel or bladder habits.  She is needing a mammogram.  We will have to see about getting this set up for her.  Overall, her performance status is ECOG zero.  PHYSICAL EXAMINATION:  General:  This is a well-developed, well- nourished Hispanic female in no obvious distress.  Vital signs: Temperature of 98.3, pulse 71, respiratory rate 14, blood pressure 124/58.  Weight is 188 pounds.  Head and neck:  Normocephalic, atraumatic skull.  There are no ocular or oral lesions.  She has no palpable cervical or supraclavicular lymph nodes.  Lungs:  Clear bilaterally.  Cardiac:  Regular rate and rhythm with a normal S1 and S2. There are no murmurs, rubs, or bruits.  Abdomen:  Soft.  She has good bowel sounds.  There is no fluid wave.  There is no palpable hepatosplenomegaly.  Back:  No tenderness over the spine, ribs, or hips. Extremities:  No clubbing, cyanosis, or edema.  She has good range motion of  her joints.  She has good strength in her upper and lower extremities.  Skin:  No rashes, ecchymosis, or petechia.  Neurological: No focal neurological deficits.  LABORATORY STUDIES:  White cell count is 4.2, hemoglobin 11.1, hematocrit 33.4, platelet count 161.  BUN 19, creatinine 0.9, potassium 3.7.  On her blood spin, she had a normochromic, normocytic population of red blood cells.  There were no nucleated red cells.  There were no schistocytes or spherocytes.  There may have been a couple target cells. White cells appear normal in morphology maturation.  I do not see any immature myeloid or lymphoid forms.  There are no blasts.  She has no atypical lymphocytes.  Platelets are adequate in number and size.  IMPRESSION:  Ms. Defrank is a very charming 42 year old Hispanic female with chronic-phase chronic myeloid leukemia.  She presented October of 2013.  She got into remission fairly quickly with Sprycel.  She had attained a major molecular remission in 3 months.  Her bone marrow is confirmatory of excellent remission.  I think we can probably get her back in 4 months' time now.  I do not see a need for any additional bone marrow tests done on her.  I spent a good half hour or so with she and her husband.  I explained to them my plan for continued followup.  I reassured her that I felt that she was  doing incredibly well.    ______________________________ Josph Macho, M.D. PRE/MEDQ  D:  06/14/2013  T:  06/15/2013  Job:  [id number]

## 2013-06-20 ENCOUNTER — Telehealth: Payer: Self-pay | Admitting: *Deleted

## 2013-06-20 NOTE — Telephone Encounter (Signed)
Called patient to let her know that her CML is still in remission per. Dr. Myna Hidalgo

## 2013-06-20 NOTE — Telephone Encounter (Signed)
Message copied by Anselm Jungling on Thu Jun 20, 2013 11:57 AM ------      Message from: Arlan Organ R      Created: Wed Jun 19, 2013  6:29 PM       Call - CML is still in remission!!  Cindee Lame ------

## 2013-07-01 ENCOUNTER — Encounter: Payer: Self-pay | Admitting: *Deleted

## 2013-07-31 ENCOUNTER — Ambulatory Visit (INDEPENDENT_AMBULATORY_CARE_PROVIDER_SITE_OTHER): Payer: BC Managed Care – PPO | Admitting: Family Medicine

## 2013-07-31 VITALS — BP 116/72 | HR 71 | Temp 98.3°F | Resp 18 | Ht 65.0 in | Wt 192.0 lb

## 2013-07-31 DIAGNOSIS — R059 Cough, unspecified: Secondary | ICD-10-CM

## 2013-07-31 DIAGNOSIS — L259 Unspecified contact dermatitis, unspecified cause: Secondary | ICD-10-CM

## 2013-07-31 DIAGNOSIS — L309 Dermatitis, unspecified: Secondary | ICD-10-CM

## 2013-07-31 DIAGNOSIS — R05 Cough: Secondary | ICD-10-CM

## 2013-07-31 DIAGNOSIS — B079 Viral wart, unspecified: Secondary | ICD-10-CM

## 2013-07-31 MED ORDER — HYDROCODONE-HOMATROPINE 5-1.5 MG/5ML PO SYRP: 5.0000 mL | ORAL_SOLUTION | Freq: Three times a day (TID) | ORAL | Status: DC | PRN

## 2013-07-31 MED ORDER — DOXYCYCLINE HYCLATE 100 MG PO TABS: 100.0000 mg | ORAL_TABLET | Freq: Two times a day (BID) | ORAL | Status: DC

## 2013-07-31 MED ORDER — CLOTRIMAZOLE-BETAMETHASONE 1-0.05 % EX CREA: 1.0000 "application " | TOPICAL_CREAM | Freq: Two times a day (BID) | CUTANEOUS | Status: DC

## 2013-07-31 NOTE — Progress Notes (Signed)
° ° ° °  Patient Name: [name] Date of Birth: 1971/05/08 Medical Record Number: [id number] Gender: female Date of Encounter: 07/31/2013  Chief Complaint: infected middle finger and Cough   History of Present Illness:  [name] is a 42 y.o. very pleasant female patient who presents with the following:  Pt c/o tender itchy "swollen area" to the left middle finger that first appeared 3-4 days ago. She states the area initially started as a blister and slowly progressed into "an infection". Pt says she sometimes does not wear gloves at work, and feels that may be related to her current symptoms. She also c/o of a cough with associated HA, rhinorrhea, and head congestion that initially started 1 weeks ago. She says her HA, rhinorrhea, and head congestion have now resolved, but says her dry cough has not improved. Pt states her cough is worsened at night time. She denies a h/o asthma.  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  Current Outpatient Prescriptions on File Prior to Visit  Medication Sig Dispense Refill   dasatinib (SPRYCEL) 70 MG tablet  Take 70 mg by mouth daily.       Multiple Vitamin (MULTIVITAMIN WITH MINERALS) TABS tablet Take 1 tablet by mouth daily.       fluconazole (DIFLUCAN) 100 MG tablet Take 1 pill a day for 5 days for yeast infection  30 tablet  4   Lactobacillus Rhamnosus, GG, (RA PROBIOTIC DIGESTIVE CARE PO) Take by mouth every morning.       No current facility-administered medications on file prior to visit.    Review of Systems:   Physical Examination: Filed Vitals:   07/31/13 1607  BP: 116/72  Pulse: 71  Temp: 98.3 F (36.8 C)  Resp: 18   @vitals2 @ Body mass index is 31.95 kg/(m^2). Ideal Body Weight: @FLOWAMB ([id number])@  Left middle finger shows eczematous changes and some swelling at the volar surface of the proximal phalanx. She also has a wart on her left ring finger at the PIP.  Patient has experienced discharge from her nose, mildly erythematous pharynx, supple neck, normal TMs, no adenopathy or thyromegaly.  EKG / Labs / Xrays: None available at time of encounter  Assessment and Plan: Eczema - Plan: clotrimazole-betamethasone (LOTRISONE) cream  Wart  Cough - Plan: doxycycline (VIBRA-TABS) 100 MG tablet, HYDROcodone-homatropine (HYCODAN) 5-1.5 MG/5ML syrup  Signed, Elvina Sidle, MD  Was treated with liquid nitrogen.

## 2013-07-31 NOTE — Patient Instructions (Signed)
Cough, Adult  A cough is a reflex that helps clear your throat and airways. It can help heal the body or may be a reaction to an irritated airway. A cough may only last 2 or 3 weeks (acute) or may last more than 8 weeks (chronic).  CAUSES Acute cough:  Viral or bacterial infections. Chronic cough:  Infections.  Allergies.  Asthma.  Post-nasal drip.  Smoking.  Heartburn or acid reflux.  Some medicines.  Chronic lung problems (COPD).  Cancer. SYMPTOMS   Cough.  Fever.  Chest pain.  Increased breathing rate.  High-pitched whistling sound when breathing (wheezing).  Colored mucus that you cough up (sputum). TREATMENT   A bacterial cough may be treated with antibiotic medicine.  A viral cough must run its course and will not respond to antibiotics.  Your caregiver may recommend other treatments if you have a chronic cough. HOME CARE INSTRUCTIONS   Only take over-the-counter or prescription medicines for pain, discomfort, or fever as directed by your caregiver. Use cough suppressants only as directed by your caregiver.  Use a cold steam vaporizer or humidifier in your bedroom or home to help loosen secretions.  Sleep in a semi-upright position if your cough is worse at night.  Rest as needed.  Stop smoking if you smoke. SEEK IMMEDIATE MEDICAL CARE IF:   You have pus in your sputum.  Your cough starts to worsen.  You cannot control your cough with suppressants and are losing sleep.  You begin coughing up blood.  You have difficulty breathing.  You develop pain which is getting worse or is uncontrolled with medicine.  You have a fever. MAKE SURE YOU:   Understand these instructions.  Will watch your condition.  Will get help right away if you are not doing well or get worse. Document Released: 01/28/2011 Document Revised: 10/24/2011 Document Reviewed: 01/28/2011 [facility] Patient Information 2014 [place], [place]. Warts Warts are a common viral  infection. They are most commonly caused by the human papillomavirus (HPV). Warts can occur at all ages. However, they occur most frequently in older children and infrequently in the elderly. Warts may be single or multiple. Location and size varies. Warts can be spread by scratching the wart and then scratching normal skin. The life cycle of warts varies. However, most will disappear over many months to a couple years. Warts commonly do not cause problems (asymptomatic) unless they are over an area of pressure, such as the bottom of the foot. If they are large enough, they may cause pain with walking. DIAGNOSIS  Warts are most commonly diagnosed by their appearance. Tissue samples (biopsies) are not required unless the wart looks abnormal. Most warts have a rough surface, are round, oval, or irregular, and are skin-colored to light yellow, brown, or gray. They are generally less than  inch (1.3 cm), but they can be any size. TREATMENT   Observation or no treatment.  Freezing with liquid nitrogen.  High heat (cautery).  Boosting the body's immunity to fight off the wart (immunotherapy using Candida antigen).  Laser surgery.  Application of various irritants and solutions. HOME CARE INSTRUCTIONS  Follow your caregiver's instructions. No special precautions are necessary. Often, treatment may be followed by a return (recurrence) of warts. Warts are generally difficult to treat and get rid of. If treatment is done in a clinic setting, usually more than 1 treatment is required. This is usually done on only a monthly basis until the wart is completely gone. SEEK IMMEDIATE MEDICAL CARE IF:  The treated skin becomes red, puffy (swollen), or painful. Document Released: 05/11/2005 Document Revised: 11/26/2012 Document Reviewed: 11/06/2009 [facility] Patient Information 2014 [place], [place]. Eczema Atopic dermatitis, or eczema, is an inherited type of sensitive skin. Often people with eczema have a family  history of allergies, asthma, or hay fever. It causes a red itchy rash and dry scaly skin. The itchiness may occur before the skin rash and may be very intense. It is not contagious. Eczema is generally worse during the cooler winter months and often improves with the warmth of summer. Eczema usually starts showing signs in infancy. Some children outgrow eczema, but it may last through adulthood. Flare-ups may be caused by:  Eating something or contact with something you are sensitive or allergic to.  Stress. DIAGNOSIS  The diagnosis of eczema is usually based upon symptoms and medical history. TREATMENT  Eczema cannot be cured, but symptoms usually can be controlled with treatment or avoidance of allergens (things to which you are sensitive or allergic to).  Controlling the itching and scratching.  Use over-the-counter antihistamines as directed for itching. It is especially useful at night when the itching tends to be worse.  Use over-the-counter steroid creams as directed for itching.  Scratching makes the rash and itching worse and may cause impetigo (a skin infection) if fingernails are contaminated (dirty).  Keeping the skin well moisturized with creams every day. This will seal in moisture and help prevent dryness. Lotions containing alcohol and water can dry the skin and are not recommended.  Limiting exposure to allergens.  Recognizing situations that cause stress.  Developing a plan to manage stress. HOME CARE INSTRUCTIONS   Take prescription and over-the-counter medicines as directed by your caregiver.  Do not use anything on the skin without checking with your caregiver.  Keep baths or showers short (5 minutes) in warm (not hot) water. Use mild cleansers for bathing. You may add non-perfumed bath oil to the bath water. It is best to avoid soap and bubble bath.  Immediately after a bath or shower, when the skin is still damp, apply a moisturizing ointment to the entire  body. This ointment should be a petroleum ointment. This will seal in moisture and help prevent dryness. The thicker the ointment the better. These should be unscented.  Keep fingernails cut short and wash hands often. If your child has eczema, it may be necessary to put soft gloves or mittens on your child at night.  Dress in clothes made of cotton or cotton blends. Dress lightly, as heat increases itching.  Avoid foods that may cause flare-ups. Common foods include cow's milk, peanut butter, eggs and wheat.  Keep a child with eczema away from anyone with fever blisters. The virus that causes fever blisters (herpes simplex) can cause a serious skin infection in children with eczema. SEEK MEDICAL CARE IF:   Itching interferes with sleep.  The rash gets worse or is not better within one week following treatment.  The rash looks infected (pus or soft yellow scabs).  You or your child has an oral temperature above 102 F (38.9 C).  Your baby is older than 3 months with a rectal temperature of 100.5 F (38.1 C) or higher for more than 1 day.  The rash flares up after contact with someone who has fever blisters. SEEK IMMEDIATE MEDICAL CARE IF:   Your baby is older than 3 months with a rectal temperature of 102 F (38.9 C) or higher.  Your baby is older than  3 months or younger with a rectal temperature of 100.4 F (38 C) or higher. Document Released: 07/29/2000 Document Revised: 10/24/2011 Document Reviewed: 03/04/2013 [facility] Patient Information 2014 [place].

## 2013-08-01 ENCOUNTER — Telehealth: Payer: Self-pay | Admitting: Radiology

## 2013-08-01 NOTE — Telephone Encounter (Signed)
Surgical code provided for removal of wart

## 2013-08-02 ENCOUNTER — Ambulatory Visit: Payer: BC Managed Care – PPO | Admitting: Internal Medicine

## 2013-08-17 ENCOUNTER — Ambulatory Visit (INDEPENDENT_AMBULATORY_CARE_PROVIDER_SITE_OTHER): Payer: BC Managed Care – PPO | Admitting: Internal Medicine

## 2013-08-17 VITALS — BP 100/60 | HR 76 | Temp 100.4°F | Resp 18 | Ht 64.5 in | Wt 190.0 lb

## 2013-08-17 DIAGNOSIS — R509 Fever, unspecified: Secondary | ICD-10-CM

## 2013-08-17 LAB — POCT CBC
Granulocyte percent: 79.6 %G (ref 37–80)
HCT, POC: 35.8 % — AB (ref 37.7–47.9)
Hemoglobin: 10.8 g/dL — AB (ref 12.2–16.2)
LYMPH, POC: 0.5 — AB (ref 0.6–3.4)
MCH: 29 pg (ref 27–31.2)
MCHC: 30.2 g/dL — AB (ref 31.8–35.4)
MCV: 96.3 fL (ref 80–97)
MID (CBC): 0.1 (ref 0–0.9)
MPV: 8.8 fL (ref 0–99.8)
PLATELET COUNT, POC: 140 10*3/uL — AB (ref 142–424)
POC Granulocyte: 2.5 (ref 2–6.9)
POC LYMPH PERCENT: 15.9 %L (ref 10–50)
POC MID %: 4.5 %M (ref 0–12)
RBC: 3.72 M/uL — AB (ref 4.04–5.48)
RDW, POC: 13.2 %
WBC: 3.2 10*3/uL — AB (ref 4.6–10.2)

## 2013-08-17 LAB — POCT INFLUENZA A/B
INFLUENZA B, POC: NEGATIVE
Influenza A, POC: NEGATIVE

## 2013-08-17 MED ORDER — ONDANSETRON HCL 4 MG PO TABS: 4.0000 mg | ORAL_TABLET | Freq: Three times a day (TID) | ORAL | Status: DC | PRN

## 2013-08-17 NOTE — Progress Notes (Addendum)
Subjective:    Patient ID: [name], female    DOB: 1971-02-24, 43 y.o.   MRN: [id number]  HPI This chart was scribed for Tami Lin, MD by Thea Alken, Scribe. This patient was seen in room 3 and the patient's care was started at 2:07 PM.  HPI Comments: [name] is a 43 y.o. female with stable chronic myelogenous leukemia who presents to the [facility] complaining of  Diarrhea x 3 watery, yesterday, and one episode emesis that occurred yesterday. She has had associated rhinorrhea and mild fever. She denies sore throat and trouble breathing at night.  Pt was seen before christmas for flu-symptoms. She reports that after she got well, she received the flu shot 12/23 and she started feeling neck, and lower back pain as well as a sore throat.    minimal cough/fever intermittently over the last 3 days.  History of CML -Pt states her blood/bone marrow biopsy was last checked in October and had a normal appearance, and has her next appointment in February.    Past Medical History  Diagnosis Date  . CML (chronic myelocytic leukemia) 2013  . 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 Cesarean section  2006 & 2012  . Tubal ligation     Family History  Problem Relation Age of Onset  . Hypertension Maternal Grandmother   . Cancer Maternal Grandfather   . Heart disease Paternal Grandmother   . Stroke Paternal Grandfather    History   Social History  . Marital Status: Married    Spouse Name: N/A    Number of Children: N/A  . Years of Education: N/A   Occupational History  . Not on file.   Social History Main Topics  . Smoking status: Never Smoker   . Smokeless tobacco: Not on file  . Alcohol Use: No  . Drug Use: No  . Sexual Activity: Not on file   Other Topics Concern  . Not on file   Social History Narrative              Review of Systems No abdominal pain no  dysuria No night sweats  No weight loss     Objective:   Physical Exam  Filed Vitals:   08/17/13 1349  BP: 100/60  Pulse: 76  Temp: 100.4 F (38 C)  Resp: 18  Conjunctiva clear TMs clear  Nares with clear rhinorrhea Throat clear No nodes or thyromegaly Chest clear Heart regular No rash Abdomen supple  Results for orders placed in visit on 08/17/13  POCT INFLUENZA A/B      Result Value Range   Influenza A, POC Negative     Influenza B, POC Negative    POCT CBC      Result Value Range   WBC 3.2 (*) 4.6 - 10.2 K/uL   Lymph, poc 0.5 (*) 0.6 - 3.4   POC LYMPH PERCENT 15.9  10 - 50 %L   MID (cbc) 0.1  0 - 0.9   POC MID % 4.5  0 - 12 %M   POC Granulocyte 2.5  2 - 6.9   Granulocyte percent 79.6  37 - 80 %G   RBC 3.72 (*) 4.04 - 5.48 M/uL   Hemoglobin 10.8 (*) 12.2 - 16.2 g/dL   HCT, POC 35.8 (*) 37.7 - 47.9 %   MCV 96.3  80 -  97 fL   MCH, POC 29.0  27 - 31.2 pg   MCHC 30.2 (*) 31.8 - 35.4 g/dL   RDW, POC 13.2     Platelet Count, POC 140 (*) 142 - 424 K/uL   MPV 8.8  0 - 99.8 fL            Assessment & Plan:  I have completed the patient encounter in its entirety as documented by the scribe, with editing by me where necessary. Chia Mowers P. Laney Pastor, M.D.  Viral syndrome with leucopenia Meds ordered this encounter  Medications  . ondansetron (ZOFRAN) 4 MG tablet    Sig: Take 1 tablet (4 mg total) by mouth every 8 (eight) hours as needed for nausea or vomiting.    Dispense:  10 tablet    Refill:  0   Sudafed/Tylenol Followup 48-72 hours if not better

## 2013-08-19 ENCOUNTER — Telehealth: Payer: Self-pay

## 2013-08-19 NOTE — Telephone Encounter (Signed)
Pt was diagnosed with the flu last sat and was given flu meds and now feels even worse with nausea/etc,please advise   Best phone for pt is [address]

## 2013-08-19 NOTE — Telephone Encounter (Signed)
I have called her, and she is advised to return if not better per follow up plan: Followup 48-72 hours if not better Advised her to push fluids. She is instructed to take the meds and push fluids all day long.

## 2013-09-20 ENCOUNTER — Encounter: Payer: Self-pay | Admitting: Obstetrics & Gynecology

## 2013-09-20 ENCOUNTER — Ambulatory Visit (INDEPENDENT_AMBULATORY_CARE_PROVIDER_SITE_OTHER): Payer: BC Managed Care – PPO | Admitting: Obstetrics & Gynecology

## 2013-09-20 VITALS — BP 115/75 | HR 76 | Temp 98.0°F | Ht 64.0 in | Wt 190.2 lb

## 2013-09-20 DIAGNOSIS — N939 Abnormal uterine and vaginal bleeding, unspecified: Secondary | ICD-10-CM

## 2013-09-20 DIAGNOSIS — Z01419 Encounter for gynecological examination (general) (routine) without abnormal findings: Secondary | ICD-10-CM

## 2013-09-20 DIAGNOSIS — Z124 Encounter for screening for malignant neoplasm of cervix: Secondary | ICD-10-CM

## 2013-09-20 NOTE — Progress Notes (Signed)
Subjective:     [name] is a 43 y.o. female here for a routine exam.  Current complaints: concerned with menopause due to late cycle.  Personal health questionnaire reviewed: yes.   Gynecologic History Patient's last menstrual period was 09/11/2013. Contraception: tubal ligation Last Pap: 2012. Results were: normal Last mammogram: 2009. Results were: normal  Obstetric History OB History  Gravida Para Term Preterm AB SAB TAB Ectopic Multiple Living  2 2 2  0 0 0 0 0 0 2    # Outcome Date GA Lbr Len/2nd Weight Sex Delivery Anes PTL Lv  2 TRM 04/25/11 [redacted]w[redacted]d  6 lb 3.7 oz (2.825 kg) M LTCS   Y     Comments: Low tone initially with recovery by 5 mintues; single umbilcal artery. No dysmorphic features.   1 TRM                The following portions of the patient's history were reviewed and updated as appropriate: allergies, current medications, past family history, past medical history, past social history, past surgical history and problem list.  Review of Systems Pertinent items are noted in HPI.  Objective:    BP 115/75  Pulse 76  Temp(Src) 98 F (36.7 C) (Oral)  Ht 5\' 4"  (1.626 m)  Wt 190 lb 3.2 oz (86.274 kg)  BMI 32.63 kg/m2  LMP 09/11/2013  General Appearance:    Alert, cooperative, no distress, appears stated age  Breast Exam:    No tenderness, masses, or nipple abnormality  Abdomen:     Soft, non-tender, bowel sounds active all four quadrants,    no masses, no organomegaly  Genitalia:    Normal female without lesion, discharge or tenderness    Assessment:   Irregular menses--?stress, medication-related   Plan:  Keep menstrual calendar Schedule mammogram Pap smear today Return in a few months

## 2013-09-20 NOTE — Patient Instructions (Signed)
Luvena for vaginal dryness Monitor cycles for 3 months for irregulartiy

## 2013-09-21 LAB — FOLLICLE STIMULATING HORMONE: FSH: 32.4 m[IU]/mL

## 2013-09-21 LAB — ESTRADIOL: ESTRADIOL: 82.6 pg/mL

## 2013-09-23 ENCOUNTER — Other Ambulatory Visit: Payer: Self-pay | Admitting: Obstetrics & Gynecology

## 2013-09-23 ENCOUNTER — Ambulatory Visit: Payer: BC Managed Care – PPO | Admitting: Obstetrics & Gynecology

## 2013-09-23 DIAGNOSIS — Z1231 Encounter for screening mammogram for malignant neoplasm of breast: Secondary | ICD-10-CM

## 2013-09-24 LAB — PAP IG AND HPV HIGH-RISK: HPV DNA HIGH RISK: NOT DETECTED

## 2013-10-04 ENCOUNTER — Ambulatory Visit (HOSPITAL_COMMUNITY)
Admission: RE | Admit: 2013-10-04 | Discharge: 2013-10-04 | Disposition: A | Discharge status: Home / Self Care | Payer: BC Managed Care – PPO | Source: Ambulatory Visit | Attending: Obstetrics & Gynecology | Admitting: Obstetrics & Gynecology

## 2013-10-04 DIAGNOSIS — Z1231 Encounter for screening mammogram for malignant neoplasm of breast: Secondary | ICD-10-CM | POA: Insufficient documentation

## 2013-10-10 ENCOUNTER — Other Ambulatory Visit: Payer: BC Managed Care – PPO | Admitting: Lab

## 2013-10-10 ENCOUNTER — Ambulatory Visit: Payer: BC Managed Care – PPO | Admitting: Hematology & Oncology

## 2013-10-11 ENCOUNTER — Other Ambulatory Visit: Payer: BC Managed Care – PPO | Admitting: Lab

## 2013-10-11 ENCOUNTER — Ambulatory Visit: Payer: BC Managed Care – PPO | Admitting: Hematology & Oncology

## 2013-10-11 ENCOUNTER — Telehealth: Payer: Self-pay | Admitting: Hematology & Oncology

## 2013-10-11 NOTE — Telephone Encounter (Signed)
Pt aware of 4-13 appointment. She cannot come on tues or thurs and it has to be after 3 pm

## 2013-11-25 ENCOUNTER — Other Ambulatory Visit (HOSPITAL_BASED_OUTPATIENT_CLINIC_OR_DEPARTMENT_OTHER): Payer: BC Managed Care – PPO | Admitting: Lab

## 2013-11-25 ENCOUNTER — Encounter: Payer: Self-pay | Admitting: Hematology & Oncology

## 2013-11-25 ENCOUNTER — Ambulatory Visit (HOSPITAL_BASED_OUTPATIENT_CLINIC_OR_DEPARTMENT_OTHER): Payer: BC Managed Care – PPO | Admitting: Hematology & Oncology

## 2013-11-25 VITALS — BP 110/56 | HR 75 | Temp 97.9°F | Resp 14 | Ht 63.0 in | Wt 192.0 lb

## 2013-11-25 DIAGNOSIS — C9211 Chronic myeloid leukemia, BCR/ABL-positive, in remission: Secondary | ICD-10-CM

## 2013-11-25 DIAGNOSIS — C9210 Chronic myeloid leukemia, BCR/ABL-positive, not having achieved remission: Secondary | ICD-10-CM

## 2013-11-25 LAB — CBC WITH DIFFERENTIAL (CANCER CENTER ONLY)
BASO#: 0 10*3/uL (ref 0.0–0.2)
BASO%: 0.2 % (ref 0.0–2.0)
EOS%: 0.9 % (ref 0.0–7.0)
Eosinophils Absolute: 0 10*3/uL (ref 0.0–0.5)
HCT: 33.6 % — ABNORMAL LOW (ref 34.8–46.6)
HGB: 11.3 g/dL — ABNORMAL LOW (ref 11.6–15.9)
LYMPH#: 1.5 10*3/uL (ref 0.9–3.3)
LYMPH%: 35.2 % (ref 14.0–48.0)
MCH: 31.1 pg (ref 26.0–34.0)
MCHC: 33.6 g/dL (ref 32.0–36.0)
MCV: 93 fL (ref 81–101)
MONO#: 0.3 10*3/uL (ref 0.1–0.9)
MONO%: 7.2 % (ref 0.0–13.0)
NEUT#: 2.4 10*3/uL (ref 1.5–6.5)
NEUT%: 56.5 % (ref 39.6–80.0)
PLATELETS: 153 10*3/uL (ref 145–400)
RBC: 3.63 10*6/uL — ABNORMAL LOW (ref 3.70–5.32)
RDW: 13.8 % (ref 11.1–15.7)
WBC: 4.3 10*3/uL (ref 3.9–10.0)

## 2013-11-25 LAB — COMPREHENSIVE METABOLIC PANEL
ALK PHOS: 45 U/L (ref 39–117)
ALT: 16 U/L (ref 0–35)
AST: 18 U/L (ref 0–37)
Albumin: 4.1 g/dL (ref 3.5–5.2)
BUN: 20 mg/dL (ref 6–23)
CO2: 26 mEq/L (ref 19–32)
Calcium: 8.7 mg/dL (ref 8.4–10.5)
Chloride: 101 mEq/L (ref 96–112)
Creatinine, Ser: 0.85 mg/dL (ref 0.50–1.10)
GLUCOSE: 121 mg/dL — AB (ref 70–99)
Potassium: 4 mEq/L (ref 3.5–5.3)
SODIUM: 133 meq/L — AB (ref 135–145)
Total Bilirubin: 0.3 mg/dL (ref 0.2–1.2)
Total Protein: 6.3 g/dL (ref 6.0–8.3)

## 2013-11-25 LAB — MAGNESIUM: MAGNESIUM: 1.6 mg/dL (ref 1.5–2.5)

## 2013-11-25 NOTE — Progress Notes (Signed)
Hematology and Oncology Follow Up Visit  [name] [id number] 1971/05/08 43 y.o. 11/25/2013   Principle Diagnosis:   Chronic phase CML-molecular remission  Current Therapy:    Sprycel 70 mg by mouth daily     Interim History:  [name] is back for followup apparently see her every 6 months. She done incredibly well. She tolerated the Sprycel without problems.  She is having some irregular monthly cycles. She does see her gynecologist for this.  She's had no problems diarrhea. She's had no cough. No shortness of breath. She did have the flu over the winter for about 4 or 5 days.  When we last saw her in October, she had a negative BCR/ABL analysis. As such, she continues to be in a major molecular remission.  She's had no rashes. There's been no arthralgias. Patient wants to work out a little bit more.  Medications: Current outpatient prescriptions:dasatinib (SPRYCEL) 70 MG tablet, Take 70 mg by mouth daily., Disp: , Rfl: ;  Multiple Vitamin (MULTIVITAMIN WITH MINERALS) TABS tablet, Take 1 tablet by mouth daily., Disp: , Rfl:   Allergies: No Known Allergies  Past Medical History, Surgical history, Social history, and Family History were reviewed and updated.  Review of Systems: As above  Physical Exam:  height is 5\' 3"  (1.6 m) and weight is 192 lb (87.091 kg). Her oral temperature is 97.9 F (36.6 C). Her blood pressure is 110/56 and her pulse is 75. Her respiration is 14.   Well-developed well-nourished Hispanic female. Head and neck exam shows no adenopathy in the neck. There is no ocular or oral lesions. Lungs are clear. Cardiac exam regular in rhythm with no murmurs rubs or bruits. Abdomen is soft. Has good bowel sounds. There is no fluid wave. There is a palpable liver edge appears no palpable splenomegaly. Back exam no tenderness over the spine ribs or hips. Extremities shows no clubbing cyanosis or edema. Skin exam no rashes ecchymoses or petechia. Neurological exam  is nonfocal.  Lab Results  Component Value Date   WBC 4.3 11/25/2013   HGB 11.3* 11/25/2013   HCT 33.6* 11/25/2013   MCV 93 11/25/2013   PLT 153 11/25/2013     Chemistry      Component Value Date/Time   NA 134 06/14/2013 1526   NA 134* 04/01/2013 1018   K 3.7 06/14/2013 1526   K 3.7 04/01/2013 1018   CL 105 06/14/2013 1526   CL 103 04/01/2013 1018   CO2 26 06/14/2013 1526   CO2 26 04/01/2013 1018   BUN 19 06/14/2013 1526   BUN 16 04/01/2013 1018   CREATININE 0.9 06/14/2013 1526   CREATININE 0.87 04/01/2013 1018      Component Value Date/Time   CALCIUM 8.9 06/14/2013 1526   CALCIUM 8.8 04/01/2013 1018   ALKPHOS 45 06/14/2013 1526   ALKPHOS 49 04/01/2013 1018   AST 19 06/14/2013 1526   AST 14 04/01/2013 1018   ALT 12 06/14/2013 1526   ALT 11 04/01/2013 1018   BILITOT 0.50 06/14/2013 1526   BILITOT 0.4 04/01/2013 1018         Impression and Plan: [name] is 43 year old Hispanic female. She has chronic phase CML. She is on Sprycel. She's doing fantastic. I suspect that she still is a major molecular remission.  We will plan to go back in 6 more months.  I don't see any need to do any blood work or labs in between visits.  If she needs any additional  procedures done by her other doctors, I don't see a problem with this.   Volanda Napoleon, MD 4/13/20155:11 PM

## 2013-11-26 ENCOUNTER — Telehealth: Payer: Self-pay | Admitting: Hematology & Oncology

## 2013-11-26 NOTE — Telephone Encounter (Signed)
Left message with 10-12 appointment

## 2013-12-18 ENCOUNTER — Telehealth: Payer: Self-pay | Admitting: *Deleted

## 2013-12-18 ENCOUNTER — Ambulatory Visit (INDEPENDENT_AMBULATORY_CARE_PROVIDER_SITE_OTHER): Payer: BC Managed Care – PPO | Admitting: Family Medicine

## 2013-12-18 VITALS — BP 112/62 | HR 90 | Temp 98.9°F | Resp 18 | Ht 64.0 in | Wt 190.0 lb

## 2013-12-18 DIAGNOSIS — R509 Fever, unspecified: Secondary | ICD-10-CM

## 2013-12-18 DIAGNOSIS — K5289 Other specified noninfective gastroenteritis and colitis: Secondary | ICD-10-CM

## 2013-12-18 DIAGNOSIS — K529 Noninfective gastroenteritis and colitis, unspecified: Secondary | ICD-10-CM

## 2013-12-18 DIAGNOSIS — M545 Low back pain, unspecified: Secondary | ICD-10-CM

## 2013-12-18 DIAGNOSIS — R110 Nausea: Secondary | ICD-10-CM

## 2013-12-18 DIAGNOSIS — R1013 Epigastric pain: Secondary | ICD-10-CM

## 2013-12-18 LAB — POCT UA - MICROSCOPIC ONLY
CRYSTALS, UR, HPF, POC: NEGATIVE
Casts, Ur, LPF, POC: NEGATIVE
Mucus, UA: NEGATIVE
RBC, URINE, MICROSCOPIC: NEGATIVE
Yeast, UA: NEGATIVE

## 2013-12-18 LAB — POCT URINALYSIS DIPSTICK
Bilirubin, UA: NEGATIVE
Blood, UA: NEGATIVE
Glucose, UA: NEGATIVE
KETONES UA: 15
Leukocytes, UA: NEGATIVE
Nitrite, UA: NEGATIVE
PH UA: 6.5
PROTEIN UA: NEGATIVE
SPEC GRAV UA: 1.01
Urobilinogen, UA: 0.2

## 2013-12-18 MED ORDER — ONDANSETRON 8 MG PO TBDP: 8.0000 mg | ORAL_TABLET | Freq: Three times a day (TID) | ORAL | Status: DC | PRN

## 2013-12-18 MED ORDER — ONDANSETRON 4 MG PO TBDP
8.0000 mg | ORAL_TABLET | Freq: Once | ORAL | Status: AC
  Administered 2013-12-18: 8 mg via ORAL

## 2013-12-18 NOTE — Telephone Encounter (Signed)
Attempted to call the patient back, left message on voice mail making aware that a GI bug is going around. Advised patient to stay hydrated and treat the sx (tylenol, ibuprofen for fever and aches). Patient advised to rest and maintain good hand hygiene as well.

## 2013-12-18 NOTE — Progress Notes (Signed)
Subjective:    Patient ID: [name], female    DOB: [birth year], 43 y.o.   MRN: [id number]  HPI Patient started feeling bad today with upper abdominal pain, cramps and all day nausea. Felt weak and achy.  Was seen at the [facility] earlier today and was told she had a pulsatile abdomen and that she should be seen here.  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  Patient called her primary care office and was told to rest, hydrate and take otc analgesics for symptoms. She presented to the [facility] because she was not sure if she should go to work tomorrow. She worked today while feeling badly. She ate some salad at lunch today which made her feel like she might vomit.   Patient has CML which is currently in remission. She takes dasatinib daily for this.  Review of Systems Temp to 100.9 earlier today, no vomiting, no diarrhea, some loose bowel movement today, mild headache, no dizziness.    Objective:   Physical Exam  Vitals reviewed. Constitutional: She is oriented to person, place, and time. She appears well-developed and well-nourished. No distress.  HENT:  Head: Normocephalic and atraumatic.  Right Ear: External ear normal.  Left Ear: External ear normal.  Nose: Nose normal.  Mouth/Throat: Oropharynx is clear and moist.  Eyes: Conjunctivae are normal. Right eye exhibits no discharge. Left eye exhibits no discharge.  Neck: Normal range of motion. Neck supple.  Cardiovascular: Normal rate, regular rhythm and normal heart sounds.   Pulmonary/Chest: Effort normal and breath sounds normal.  Abdominal: Soft. She exhibits no mass. Bowel sounds are increased. There is no hepatosplenomegaly, splenomegaly or hepatomegaly. There is tenderness in the right upper quadrant, epigastric area and left upper quadrant. There is no rigidity, no rebound, no guarding, no CVA tenderness and no tenderness at McBurney's point.  Musculoskeletal: Normal range of  motion.  Lymphadenopathy:    She has no cervical adenopathy.  Neurological: She is alert and oriented to person, place, and time.  Skin: Skin is warm and dry. She is not diaphoretic.  Psychiatric: She has a normal mood and affect. Her behavior is normal. Judgment normal.   Results for orders placed in visit on 12/18/13  POCT UA - MICROSCOPIC ONLY      Result Value Ref Range   WBC, Ur, HPF, POC 0-1     RBC, urine, microscopic neg     Bacteria, U Microscopic trace     Mucus, UA neg     Epithelial cells, urine per micros 0-1     Crystals, Ur, HPF, POC neg     Casts, Ur, LPF, POC neg     Yeast, UA neg    POCT URINALYSIS DIPSTICK      Result Value Ref Range   Color, UA yellow     Clarity, UA clear     Glucose, UA neg     Bilirubin, UA neg     Ketones, UA 15     Spec Grav, UA 1.010     Blood, UA neg     pH, UA 6.5     Protein, UA neg     Urobilinogen, UA 0.2     Nitrite, UA neg     Leukocytes, UA Negative     Patient given odansteron 8mg  odt in the office.    Assessment & Plan:  1. Gastroenteritis -this illness seems viral in nature. Provided written and oral instructions  for bland diet, hydration, rest -RTC if worsening symptoms or no improvement in 3 days  2. Nausea alone - POCT UA - Microscopic Only - POCT urinalysis dipstick - ondansetron (ZOFRAN-ODT) disintegrating tablet 8 mg; Take 2 tablets (8 mg total) by mouth once.  3. Abdominal pain, epigastric - POCT UA - Microscopic Only - POCT urinalysis dipstick  4. Fever - POCT UA - Microscopic Only - POCT urinalysis dipstick  5. Lower back pain - POCT UA - Microscopic Only - POCT urinalysis dipstick   Elby Beck, FNP-BC  [facility], [facility]  12/18/2013 9:43 PM

## 2013-12-18 NOTE — Telephone Encounter (Signed)
Caller name:  [place] Relation to pt:  self Call back number:  [phone] Pharmacy:  [facility] [address]  Reason for call:  Pt is a Education officer, museum.  This morning she started feeling nauseated and by this afternoon she has chills and possible fever.  She now has body aches, lower back aches.  Pt would like to know if she needs to be seen or if we can tell her if "something" is going around.  Please advise.  bw

## 2013-12-18 NOTE — Patient Instructions (Signed)
Bland diet for 3 days Frequent amounts of small liquids Rest  Viral Gastroenteritis Viral gastroenteritis is also known as stomach flu. This condition affects the stomach and intestinal tract. It can cause sudden diarrhea and vomiting. The illness typically lasts 3 to 8 days. Most people develop an immune response that eventually gets rid of the virus. While this natural response develops, the virus can make you quite ill. CAUSES  Many different viruses can cause gastroenteritis, such as rotavirus or noroviruses. You can catch one of these viruses by consuming contaminated food or water. You may also catch a virus by sharing utensils or other personal items with an infected person or by touching a contaminated surface. SYMPTOMS  The most common symptoms are diarrhea and vomiting. These problems can cause a severe loss of body fluids (dehydration) and a body salt (electrolyte) imbalance. Other symptoms may include:  Fever.  Headache.  Fatigue.  Abdominal pain. DIAGNOSIS  Your caregiver can usually diagnose viral gastroenteritis based on your symptoms and a physical exam. A stool sample may also be taken to test for the presence of viruses or other infections. TREATMENT  This illness typically goes away on its own. Treatments are aimed at rehydration. The most serious cases of viral gastroenteritis involve vomiting so severely that you are not able to keep fluids down. In these cases, fluids must be given through an intravenous line (IV). HOME CARE INSTRUCTIONS   Drink enough fluids to keep your urine clear or pale yellow. Drink small amounts of fluids frequently and increase the amounts as tolerated.  Ask your caregiver for specific rehydration instructions.  Avoid:  Foods high in sugar.  Alcohol.  Carbonated drinks.  Tobacco.  Juice.  Caffeine drinks.  Extremely hot or cold fluids.  Fatty, greasy foods.  Too much intake of anything at one time.  Dairy products until 24  to 48 hours after diarrhea stops.  You may consume probiotics. Probiotics are active cultures of beneficial bacteria. They may lessen the amount and number of diarrheal stools in adults. Probiotics can be found in yogurt with active cultures and in supplements.  Wash your hands well to avoid spreading the virus.  Only take over-the-counter or prescription medicines for pain, discomfort, or fever as directed by your caregiver. Do not give aspirin to children. Antidiarrheal medicines are not recommended.  Ask your caregiver if you should continue to take your regular prescribed and over-the-counter medicines.  Keep all follow-up appointments as directed by your caregiver. SEEK IMMEDIATE MEDICAL CARE IF:   You are unable to keep fluids down.  You do not urinate at least once every 6 to 8 hours.  You develop shortness of breath.  You notice blood in your stool or vomit. This may look like coffee grounds.  You have abdominal pain that increases or is concentrated in one small area (localized).  You have persistent vomiting or diarrhea.  You have a fever.  The patient is a child younger than 3 months, and he or she has a fever.  The patient is a child older than 3 months, and he or she has a fever and persistent symptoms.  The patient is a child older than 3 months, and he or she has a fever and symptoms suddenly get worse.  The patient is a baby, and he or she has no tears when crying. MAKE SURE YOU:   Understand these instructions.  Will watch your condition.  Will get help right away if you are not doing well  or get worse. Document Released: 08/01/2005 Document Revised: 10/24/2011 Document Reviewed: 05/18/2011 [facility] Patient Information 2014 [place].

## 2013-12-19 NOTE — Progress Notes (Signed)
I have discussed this case with Ms. Gessner, NP and agree.  

## 2013-12-31 ENCOUNTER — Other Ambulatory Visit: Payer: Self-pay | Admitting: Hematology & Oncology

## 2014-04-04 ENCOUNTER — Ambulatory Visit (INDEPENDENT_AMBULATORY_CARE_PROVIDER_SITE_OTHER): Payer: BC Managed Care – PPO | Admitting: Internal Medicine

## 2014-04-04 ENCOUNTER — Encounter: Payer: Self-pay | Admitting: Internal Medicine

## 2014-04-04 VITALS — BP 93/61 | HR 69 | Temp 98.1°F | Ht 65.0 in | Wt 190.0 lb

## 2014-04-04 DIAGNOSIS — Z0000 Encounter for general adult medical examination without abnormal findings: Secondary | ICD-10-CM

## 2014-04-04 DIAGNOSIS — I7789 Other specified disorders of arteries and arterioles: Secondary | ICD-10-CM

## 2014-04-04 NOTE — Assessment & Plan Note (Addendum)
Td 2012 per pt Diet-exercise discussed , observe mild DOE, should get better if continue exercising  Labs reviewed, mild anemia, CBG was slt elevated (not fasting); will check iron-ferritin-a1c, flp Female care per gyn Also Ao is palpable, on further questions, she was told that before and is quite concerned and anxious, will check a [place], chances for her to have a AAA are extremely low

## 2014-04-04 NOTE — Progress Notes (Signed)
   Subjective:    Patient ID: [name], female    DOB: [birth year], 43 y.o.   MRN: [id number]  DOS:  04/04/2014 Type of visit - description: CPX History: Feels well   ROS Diet, Exercise-- doing better  no CP, no SOB but occ DOE (just started to exercise ) Denies  nausea, vomiting diarrhea, blood in the stools (-) cough, sputum production (-) wheezing, chest congestion No dysuria, gross hematuria, difficulty urinating  No anxiety, depression Periods slt irreg, very heavy x 2-3 days    Past Medical History  Diagnosis Date  . CML (chronic myelocytic leukemia) 2013  . 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 Glaucoma suspect     Past Surgical History  Procedure Laterality Date  . Cesarean section  2006 & 2012  . Tubal ligation      History   Social History  . Marital Status: Married    Spouse Name: Bradly Chris    Number of Children: 2  . Years of Education: N/A   Occupational History  . [employer]   Social History Main Topics  . Smoking status: Never Smoker   . Smokeless tobacco: Never Used     Comment: never used tobacco  . Alcohol Use: No  . Drug Use: No  . Sexual Activity: Yes    Partners: Male   Other Topics Concern  . Not on file   Social History Narrative   From [place]    2 children  --- 2006, 2012           Family History  Problem Relation Age of Onset  . Hypertension Maternal Grandmother   . Cancer Maternal Grandfather   . Heart disease Paternal Grandmother   . Stroke Paternal Grandfather   . Diabetes Neg Hx   . Colon cancer Neg Hx   . Breast cancer Neg Hx        Medication List       This list is accurate as of: 04/04/14 11:59 PM.  Always use your most recent med list.               dasatinib 70 MG tablet  Commonly known as:  SPRYCEL  Take 70 mg by mouth daily.     multivitamin with minerals Tabs tablet  Take 1 tablet by mouth daily.           Objective:   Physical Exam BP 93/61   Pulse 69  Temp(Src) 98.1 F (36.7 C) (Oral)  Ht 5\' 5"  (1.651 m)  Wt 190 lb (86.183 kg)  BMI 31.62 kg/m2  SpO2 99%  LMP 04/04/2014  General -- alert, well-developed, NAD.  Neck --no thyromegaly  HEENT-- Not pale. Lungs -- normal respiratory effort, no intercostal retractions, no accessory muscle use, and normal breath sounds.  Heart-- normal rate, regular rhythm, no murmur.  Abdomen-- Not distended, good bowel sounds,soft, non-tender. Palpable not tender Ao w/o bruit (upper abd) Extremities-- no pretibial edema bilaterally  Neurologic--  alert & oriented X3. Speech normal, gait appropriate for age, strength symmetric and appropriate for age.  Psych-- Cognition and judgment appear intact. Cooperative with normal attention span and concentration. No anxious or depressed appearing.     Assessment & Plan:

## 2014-04-04 NOTE — Progress Notes (Signed)
Pre-visit discussion using our clinic review tool. No additional management support is needed unless otherwise documented below in the visit note.  

## 2014-04-04 NOTE — Patient Instructions (Signed)
Get your blood work before you leave   Next visit is for a physical exam in 1 year,  fasting Please make an appointment

## 2014-04-05 LAB — LIPID PANEL
CHOLESTEROL: 187 mg/dL (ref 0–200)
HDL: 73.8 mg/dL (ref >39.00)
LDL Cholesterol: 105 mg/dL — ABNORMAL HIGH (ref 0–99)
NonHDL: 113.2
Total CHOL/HDL Ratio: 3
Triglycerides: 39 mg/dL (ref 0.0–149.0)
VLDL: 7.8 mg/dL (ref 0.0–40.0)

## 2014-04-05 LAB — HEMOGLOBIN A1C: Hgb A1c MFr Bld: 5.4 % (ref 4.6–6.5)

## 2014-04-05 LAB — FERRITIN: Ferritin: 25.6 ng/mL (ref 10.0–291.0)

## 2014-04-05 LAB — IRON: IRON: 50 ug/dL (ref 42–145)

## 2014-04-05 LAB — HEMOGLOBIN: HEMOGLOBIN: 11.7 g/dL — AB (ref 12.0–15.0)

## 2014-05-23 ENCOUNTER — Ambulatory Visit
Admission: RE | Admit: 2014-05-23 | Discharge: 2014-05-23 | Disposition: A | Discharge status: Home / Self Care | Payer: BC Managed Care – PPO | Source: Ambulatory Visit | Attending: Internal Medicine | Admitting: Internal Medicine

## 2014-05-23 DIAGNOSIS — I7789 Other specified disorders of arteries and arterioles: Secondary | ICD-10-CM

## 2014-05-26 ENCOUNTER — Ambulatory Visit (HOSPITAL_BASED_OUTPATIENT_CLINIC_OR_DEPARTMENT_OTHER): Payer: BC Managed Care – PPO

## 2014-05-26 ENCOUNTER — Encounter: Payer: Self-pay | Admitting: Family

## 2014-05-26 ENCOUNTER — Ambulatory Visit (HOSPITAL_BASED_OUTPATIENT_CLINIC_OR_DEPARTMENT_OTHER): Payer: BC Managed Care – PPO | Admitting: Family

## 2014-05-26 ENCOUNTER — Other Ambulatory Visit (HOSPITAL_BASED_OUTPATIENT_CLINIC_OR_DEPARTMENT_OTHER): Payer: BC Managed Care – PPO | Admitting: Lab

## 2014-05-26 VITALS — BP 107/66 | HR 63 | Temp 98.2°F | Resp 16 | Ht 65.0 in | Wt 191.0 lb

## 2014-05-26 DIAGNOSIS — C9210 Chronic myeloid leukemia, BCR/ABL-positive, not having achieved remission: Secondary | ICD-10-CM

## 2014-05-26 DIAGNOSIS — Z23 Encounter for immunization: Secondary | ICD-10-CM

## 2014-05-26 DIAGNOSIS — C9290 Myeloid leukemia, unspecified, not having achieved remission: Secondary | ICD-10-CM

## 2014-05-26 DIAGNOSIS — F988 Other specified behavioral and emotional disorders with onset usually occurring in childhood and adolescence: Secondary | ICD-10-CM

## 2014-05-26 LAB — COMPREHENSIVE METABOLIC PANEL
ALBUMIN: 4 g/dL (ref 3.5–5.2)
ALT: 13 U/L (ref 0–35)
AST: 16 U/L (ref 0–37)
Alkaline Phosphatase: 51 U/L (ref 39–117)
BUN: 22 mg/dL (ref 6–23)
CO2: 25 mEq/L (ref 19–32)
Calcium: 8.7 mg/dL (ref 8.4–10.5)
Chloride: 108 mEq/L (ref 96–112)
Creatinine, Ser: 0.77 mg/dL (ref 0.50–1.10)
Glucose, Bld: 100 mg/dL — ABNORMAL HIGH (ref 70–99)
POTASSIUM: 3.8 meq/L (ref 3.5–5.3)
SODIUM: 141 meq/L (ref 135–145)
Total Bilirubin: 0.3 mg/dL (ref 0.2–1.2)
Total Protein: 6.3 g/dL (ref 6.0–8.3)

## 2014-05-26 LAB — CBC WITH DIFFERENTIAL (CANCER CENTER ONLY)
BASO#: 0 10*3/uL (ref 0.0–0.2)
BASO%: 0 % (ref 0.0–2.0)
EOS ABS: 0 10*3/uL (ref 0.0–0.5)
EOS%: 1.1 % (ref 0.0–7.0)
HEMATOCRIT: 32.9 % — AB (ref 34.8–46.6)
HEMOGLOBIN: 10.7 g/dL — AB (ref 11.6–15.9)
LYMPH#: 1.8 10*3/uL (ref 0.9–3.3)
LYMPH%: 47.7 % (ref 14.0–48.0)
MCH: 31 pg (ref 26.0–34.0)
MCHC: 32.5 g/dL (ref 32.0–36.0)
MCV: 95 fL (ref 81–101)
MONO#: 0.3 10*3/uL (ref 0.1–0.9)
MONO%: 7.2 % (ref 0.0–13.0)
NEUT#: 1.7 10*3/uL (ref 1.5–6.5)
NEUT%: 44 % (ref 39.6–80.0)
Platelets: 150 10*3/uL (ref 145–400)
RBC: 3.45 10*6/uL — ABNORMAL LOW (ref 3.70–5.32)
RDW: 12.7 % (ref 11.1–15.7)
WBC: 3.8 10*3/uL — ABNORMAL LOW (ref 3.9–10.0)

## 2014-05-26 LAB — LACTATE DEHYDROGENASE: LDH: 95 U/L (ref 94–250)

## 2014-05-26 MED ORDER — INFLUENZA VAC SPLIT QUAD 0.5 ML IM SUSY
0.5000 mL | PREFILLED_SYRINGE | Freq: Once | INTRAMUSCULAR | Status: AC
  Administered 2014-05-26: 0.5 mL via INTRAMUSCULAR
  Filled 2014-05-26: qty 0.5

## 2014-05-26 NOTE — Progress Notes (Signed)
[facility]  Telephone:([phone]) [phone] Fax:([phone]) [phone]  ID: [name] OB: [birth year] MR#: [id number]#:[id number] Patient Care Team: Colon Branch, MD as PCP - General  DIAGNOSIS: Chronic phase CML-molecular remission  INTERVAL HISTORY: [name] is here today for a follow-up. She is feeling very tired and having issues with her stomach and mild diarrhea. She has had no issues with the Sprycel and seems to be responding well to it. She has a rash on her left hand that she is going to go see her dermatologist about on Wednesday. Her last mammogram was in February and it was normal. 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 She is still having heavy cycles and sees her gynecologist regularly. She has had no bleeding. She denies fever, chills, n/v, cough, headache, dizziness, SOB, chest pain, palpitations, constipation, blood in urine or stool. Her appetite is good and she is staying hydrated. She has had no swelling, tenderness, numbness or tingling in her extremities. In October her BCR/ABL analysis was negative. She still appears to be in major molecular remission. She is trying to be more active and get more exercise.   CURRENT TREATMENT: Sprycel 70 mg by mouth daily  REVIEW OF SYSTEMS: All other 10 point review of systems is negative.   PAST MEDICAL HISTORY: Past Medical History  Diagnosis Date  . CML (chronic myelocytic leukemia) 2013  . 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 Glaucoma suspect    PAST SURGICAL HISTORY: Past Surgical History  Procedure Laterality Date  . Cesarean section  2006 & 2012  . Tubal ligation     FAMILY HISTORY Family History  Problem Relation Age of Onset  . Hypertension Maternal Grandmother   . Cancer Maternal Grandfather   . Heart disease Paternal Grandmother   . Stroke Paternal Grandfather   . Diabetes Neg Hx   . Colon cancer  Neg Hx   . Breast cancer Neg Hx    GYNECOLOGIC HISTORY:  No LMP recorded.   SOCIAL HISTORY: History   Social History  . Marital Status: Married    Spouse Name: Bradly Chris    Number of Children: 2  . Years of Education: N/A   Occupational History  . [employer]   Social History Main Topics  . Smoking status: Never Smoker   . Smokeless tobacco: Never Used     Comment: never used tobacco  . Alcohol Use: No  . Drug Use: No  . Sexual Activity: Yes    Partners: Male   Other Topics Concern  . Not on file   Social History Narrative   From [place]    2 children  --- 2006, 2012         ADVANCED DIRECTIVES:  <no information>  HEALTH MAINTENANCE: History  Substance Use Topics  . Smoking status: Never Smoker   . Smokeless tobacco: Never Used     Comment: never used tobacco  . Alcohol Use: No   Colonoscopy: PAP: Bone density: Lipid panel:  No Known Allergies  Current Outpatient Prescriptions  Medication Sig Dispense Refill  . dasatinib (SPRYCEL) 70 MG tablet Take 70 mg by mouth daily.      . Multiple Vitamin (MULTIVITAMIN WITH MINERALS) TABS tablet Take 1 tablet by mouth daily.       Current Facility-Administered Medications  Medication Dose Route  Frequency Provider Last Rate Last Dose  . Influenza vac split quadrivalent PF (FLUARIX) injection 0.5 mL  0.5 mL Intramuscular Once Volanda Napoleon, MD       OBJECTIVE: There were no vitals filed for this visit. There were no vitals filed for this visit. ECOG FS:1 - Symptomatic but completely ambulatory Ocular: Sclerae unicteric, pupils equal, round and reactive to light Ear-nose-throat: Oropharynx clear, dentition fair Lymphatic: No cervical or supraclavicular adenopathy Lungs no rales or rhonchi, good excursion bilaterally Heart regular rate and rhythm, no murmur appreciated Abd soft, nontender, positive bowel sounds MSK no focal spinal tenderness, no joint edema Neuro: non-focal, well-oriented,  appropriate affect Breasts: Deferred  LAB RESULTS: CMP     Component Value Date/Time   NA 133* 11/25/2013 1548   NA 134 06/14/2013 1526   K 4.0 11/25/2013 1548   K 3.7 06/14/2013 1526   CL 101 11/25/2013 1548   CL 105 06/14/2013 1526   CO2 26 11/25/2013 1548   CO2 26 06/14/2013 1526   GLUCOSE 121* 11/25/2013 1548   GLUCOSE 102 06/14/2013 1526   BUN 20 11/25/2013 1548   BUN 19 06/14/2013 1526   CREATININE 0.85 11/25/2013 1548   CREATININE 0.9 06/14/2013 1526   CALCIUM 8.7 11/25/2013 1548   CALCIUM 8.9 06/14/2013 1526   PROT 6.3 11/25/2013 1548   PROT 7.1 06/14/2013 1526   ALBUMIN 4.1 11/25/2013 1548   AST 18 11/25/2013 1548   AST 19 06/14/2013 1526   ALT 16 11/25/2013 1548   ALT 12 06/14/2013 1526   ALKPHOS 45 11/25/2013 1548   ALKPHOS 45 06/14/2013 1526   BILITOT 0.3 11/25/2013 1548   BILITOT 0.50 06/14/2013 1526   GFRNONAA 95.04 01/28/2010 0904   No results found for this basename: SPEP, UPEP,  kappa and lambda light chains   Lab Results  Component Value Date   WBC 3.8* 05/26/2014   NEUTROABS 1.7 05/26/2014   HGB 10.7* 05/26/2014   HCT 32.9* 05/26/2014   MCV 95 05/26/2014   PLT 150 05/26/2014   No results found for this basename: LABCA2   No components found with this basename: FTDDU202   No results found for this basename: INR,  in the last 168 hours Urinalysis    Component Value Date/Time   BILIRUBINUR neg 12/18/2013 1936   PROTEINUR neg 12/18/2013 1936   UROBILINOGEN 0.2 12/18/2013 1936   NITRITE neg 12/18/2013 1936   LEUKOCYTESUR Negative 12/18/2013 1936   STUDIES: None  ASSESSMENT/PLAN: [name] is 43 year old Hispanic female with chronic phase CML. She is on Sprycel and seems to be doing well with it. She appears to still be in major molecular remission. She is feeling tired a lot of the time.  Her CBC today was ok. We will wait and see what her iron studies show. She may need iron.  I told her to start taking over the counter prevacid to help with her stomach.   I  went ahead and put in an order for her next mammogram in February 2016.  We will see her back in 6 months for labs and follow-up.  She knows to call here with any questions or concerns and to go to the ED in the event of an emergency. We can certainly see her sooner if need be.   Eliezer Bottom, NP 05/26/2014 4:00 PM

## 2014-05-27 ENCOUNTER — Telehealth: Payer: Self-pay | Admitting: Hematology & Oncology

## 2014-05-27 ENCOUNTER — Other Ambulatory Visit (HOSPITAL_BASED_OUTPATIENT_CLINIC_OR_DEPARTMENT_OTHER): Payer: BC Managed Care – PPO | Admitting: Lab

## 2014-05-27 DIAGNOSIS — C9210 Chronic myeloid leukemia, BCR/ABL-positive, not having achieved remission: Secondary | ICD-10-CM

## 2014-05-27 DIAGNOSIS — C9211 Chronic myeloid leukemia, BCR/ABL-positive, in remission: Secondary | ICD-10-CM

## 2014-05-27 LAB — IRON AND TIBC CHCC
%SAT: 14 % — AB (ref 21–57)
Iron: 46 ug/dL (ref 41–142)
TIBC: 319 ug/dL (ref 236–444)
UIBC: 273 ug/dL (ref 120–384)

## 2014-05-27 LAB — FERRITIN CHCC: Ferritin: 25 ng/ml (ref 9–269)

## 2014-05-27 NOTE — Telephone Encounter (Signed)
Left message with 2-22 appointment and to call for details. I mailed schedule

## 2014-05-28 ENCOUNTER — Other Ambulatory Visit: Payer: Self-pay | Admitting: *Deleted

## 2014-05-28 ENCOUNTER — Telehealth: Payer: Self-pay | Admitting: *Deleted

## 2014-05-28 DIAGNOSIS — D509 Iron deficiency anemia, unspecified: Secondary | ICD-10-CM

## 2014-05-28 MED ORDER — SODIUM CHLORIDE 0.9 % IV SOLN: 1020.0000 mg | Freq: Once | INTRAVENOUS | Status: DC

## 2014-05-28 MED ORDER — SODIUM CHLORIDE 0.9 % IV SOLN: Freq: Once | INTRAVENOUS | Status: DC

## 2014-05-28 NOTE — Telephone Encounter (Signed)
Patient iron is low.  Needs Feraheme 1020 mg IV.  Patient understands this.  appt made

## 2014-06-02 ENCOUNTER — Ambulatory Visit (HOSPITAL_BASED_OUTPATIENT_CLINIC_OR_DEPARTMENT_OTHER): Payer: BC Managed Care – PPO

## 2014-06-02 VITALS — BP 111/67 | HR 58 | Temp 99.0°F | Resp 14

## 2014-06-02 DIAGNOSIS — C9290 Myeloid leukemia, unspecified, not having achieved remission: Secondary | ICD-10-CM

## 2014-06-02 DIAGNOSIS — C9210 Chronic myeloid leukemia, BCR/ABL-positive, not having achieved remission: Secondary | ICD-10-CM

## 2014-06-02 MED ORDER — SODIUM CHLORIDE 0.9 % IV SOLN
INTRAVENOUS | Status: DC
  Administered 2014-06-02: 15:00:00 via INTRAVENOUS

## 2014-06-02 MED ORDER — SODIUM CHLORIDE 0.9 % IV SOLN
1020.0000 mg | Freq: Once | INTRAVENOUS | Status: AC
  Administered 2014-06-02: 1020 mg via INTRAVENOUS
  Filled 2014-06-02: qty 34

## 2014-06-02 NOTE — Patient Instructions (Signed)

## 2014-06-04 ENCOUNTER — Telehealth: Payer: Self-pay | Admitting: *Deleted

## 2014-06-04 ENCOUNTER — Encounter: Payer: Self-pay | Admitting: Hematology & Oncology

## 2014-06-04 NOTE — Telephone Encounter (Signed)
Message copied by Cordelia Poche on Wed Jun 04, 2014 10:42 AM ------      Message from: Burney Gauze R      Created: Wed Jun 04, 2014 10:31 AM       Call - CML is still in remission!!  [place] job!!! [name] ------

## 2014-06-04 NOTE — Telephone Encounter (Signed)
Message copied by Cordelia Poche on Wed Jun 04, 2014 11:37 AM ------      Message from: Burney Gauze R      Created: Wed Jun 04, 2014 10:31 AM       Call - CML is still in remission!!  [place] job!!! [name] ------

## 2014-06-04 NOTE — Telephone Encounter (Signed)
Notified that patients lab results indicate that she's still in remission. She appreciated the call.

## 2014-06-09 ENCOUNTER — Ambulatory Visit (INDEPENDENT_AMBULATORY_CARE_PROVIDER_SITE_OTHER): Payer: BC Managed Care – PPO | Admitting: Medical

## 2014-06-09 ENCOUNTER — Encounter: Payer: Self-pay | Admitting: Medical

## 2014-06-09 ENCOUNTER — Telehealth: Payer: Self-pay | Admitting: Internal Medicine

## 2014-06-09 VITALS — BP 113/73 | HR 95 | Temp 100.2°F | Ht 65.5 in | Wt 191.4 lb

## 2014-06-09 DIAGNOSIS — R509 Fever, unspecified: Secondary | ICD-10-CM

## 2014-06-09 DIAGNOSIS — K529 Noninfective gastroenteritis and colitis, unspecified: Secondary | ICD-10-CM

## 2014-06-09 MED ORDER — ONDANSETRON 8 MG PO TBDP: 8.0000 mg | ORAL_TABLET | Freq: Three times a day (TID) | ORAL | Status: DC | PRN

## 2014-06-09 NOTE — Patient Instructions (Addendum)
Rest, hydrate, tylenol for fever.  I am prescribing zofran for nausea. Recommend bland diet and propel for hydration. Immodium otc for diarrhea.  If diarrhea last more than this wed then recommend notifying us and would order stool panel studies.  Follow up in 7 days or as needed.

## 2014-06-09 NOTE — Telephone Encounter (Signed)
Caller name: Patrica Relation to pt: Call back number:[phone] [facility]   Reason for call:  Pt states that the Rx Zofran was supposed to be called into pharmacy per AVS, nothing is there yet.  Can we call this in.  Call pt when completed.

## 2014-06-09 NOTE — Assessment & Plan Note (Signed)
Patient presents with likely gastroenteritis. Advised her to rest, hydrate and take Tylenol for  Fever. Hydrate with propel was recommended. Imodium over-the-counter was recommended for diarrhea. If her symptoms persist over the next 2 or 3 days then advised patient that we would need to do a stool culture studies. 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 However if she does have significant improvement she will let me know before then and I will write a note to return to work.  Note flu test was done today to see if symptoms may have represented flu. This was done due to the fact that she did have some diffuse myalgias at the onset of the illness. The flu test results were negative for type A and type B.

## 2014-06-09 NOTE — Progress Notes (Signed)
Subjective:    Patient ID: [name], female    DOB: 11-12-1970, 43 y.o.   MRN: [id number]  HPI  Pt in states yesterday she got acute onset of some bodyaches lower back arms, legs, chills, Ha and fever. Pt had some sneeze but not much. No recent antibiotics.  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  Pt had 3 loose stools today in less than 5 mintutes.  Pt does have some nausea.   LMP-September 31st.  Pt had flu vaccine on ocotober.   Past Medical History  Diagnosis Date  . CML (chronic myelocytic leukemia) 2013  . 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 Glaucoma suspect     History   Social History  . Marital Status: Married    Spouse Name: Bradly Chris    Number of Children: 2  . Years of Education: N/A   Occupational History  . [employer]   Social History Main Topics  . Smoking status: Never Smoker   . Smokeless tobacco: Never Used     Comment: never used tobacco  . Alcohol Use: No  . Drug Use: No  . Sexual Activity: Yes    Partners: Male   Other Topics Concern  . Not on file   Social History Narrative   From [place]    2 children  --- 2006, 2012          Past Surgical History  Procedure Laterality Date  . Cesarean section  2006 & 2012  . Tubal ligation      Family History  Problem Relation Age of Onset  . Hypertension Maternal Grandmother   . Cancer Maternal Grandfather   . Heart disease Paternal Grandmother   . Stroke Paternal Grandfather   . Diabetes Neg Hx   . Colon cancer Neg Hx   . Breast cancer Neg Hx     No Known Allergies  Current Outpatient Prescriptions on File Prior to Visit  Medication Sig Dispense Refill  . dasatinib (SPRYCEL) 70 MG tablet Take 70 mg by mouth daily.      . Multiple Vitamin (MULTIVITAMIN WITH MINERALS) TABS tablet Take 1 tablet by mouth daily.       No current facility-administered medications on file prior to visit.    BP 113/73  Pulse 95   Temp(Src) 100.2 F (37.9 C) (Oral)  Ht 5' 5.5" (1.664 m)  Wt 191 lb 6.4 oz (86.818 kg)  BMI 31.35 kg/m2  SpO2 100%  LMP 05/29/2014      Review of Systems  Constitutional: Positive for fever. Negative for chills and fatigue.       Subjective and low grade.  HENT: Negative.   Respiratory: Negative for cough, choking, chest tightness and wheezing.   Cardiovascular: Negative for chest pain and palpitations.  Gastrointestinal: Positive for abdominal pain. Negative for nausea, vomiting, diarrhea, constipation and blood in stool.       Upset stomach on and off.  Genitourinary: Negative.   Musculoskeletal: Positive for back pain. Negative for arthralgias, gait problem, joint swelling, myalgias, neck pain and neck stiffness.       Mild diffuse bodyaches.  Skin: Negative.   Hematological: Negative for adenopathy. Does not bruise/bleed easily.  Psychiatric/Behavioral: Negative.        Objective:   Physical Exam  General Appearance- Not in acute distress.  HEENT Eyes- Scleraeral/Conjuntiva-bilat- Not Yellow. Mouth & Throat- Normal.  Chest and  Lung Exam Auscultation: Breath sounds:-Normal, clear even and unlabored. Adventitious sounds:- No Adventitious sounds.  Cardiovascular Auscultation:Rythm - Regular,rate and rythm. Heart Sounds -Normal heart sounds.  Abdomen Inspection:-Inspection Normal.  Palpation/Perucssion: Palpation and Percussion of the abdomen reveal- Non Tender, No Rebound tenderness, No rigidity(Guarding) and No Palpable abdominal masses.  Liver:-Normal.  Spleen:- Normal.   Back- no cva tenderness.  Skin-moist and well-hydrated.        Assessment & Plan:  Flu test neg. A and B.

## 2014-06-09 NOTE — Progress Notes (Signed)
Pre visit review using our clinic review tool, if applicable. No additional management support is needed unless otherwise documented below in the visit note. 

## 2014-06-09 NOTE — Telephone Encounter (Signed)
Zofran was sent earlier today. It was not sent immediately after patient left but within a reasonable time frame. We'll get staff and notify patient prescription is at pharmacy.

## 2014-06-10 ENCOUNTER — Telehealth: Payer: Self-pay | Admitting: Internal Medicine

## 2014-06-10 ENCOUNTER — Other Ambulatory Visit: Payer: Self-pay

## 2014-06-10 DIAGNOSIS — R509 Fever, unspecified: Secondary | ICD-10-CM

## 2014-06-10 DIAGNOSIS — R197 Diarrhea, unspecified: Secondary | ICD-10-CM

## 2014-06-10 NOTE — Telephone Encounter (Signed)
Called patient,states she did pick up medication.

## 2014-06-10 NOTE — Telephone Encounter (Signed)
It has been one day since I saw her. Would recommend she take tylenol every 6 hours and 2 hours after tylenol use ibuprofen. This may help with fever and chills. She could come by tomorrow and get stool kit to do stool studies(turn in quicker the better) and also could get cbc, and cmp. I will put those orders in. She does not need to be seen. But do call us on wed am and give [place] update at that point. Sometimes pending stool studies will give short course of antibiotics.

## 2014-06-10 NOTE — Telephone Encounter (Signed)
Caller name: Rosabell, Geyer Relation to pt: self  Call back number: [phone]  Reason for call: pt is still exp. cold chills and  Diarrhea in need of clinical advice

## 2014-06-11 ENCOUNTER — Ambulatory Visit (INDEPENDENT_AMBULATORY_CARE_PROVIDER_SITE_OTHER): Payer: BC Managed Care – PPO | Admitting: Medical

## 2014-06-11 ENCOUNTER — Encounter: Payer: Self-pay | Admitting: Medical

## 2014-06-11 VITALS — BP 126/83 | HR 83 | Temp 99.1°F | Ht 65.5 in | Wt 189.6 lb

## 2014-06-11 DIAGNOSIS — R509 Fever, unspecified: Secondary | ICD-10-CM

## 2014-06-11 DIAGNOSIS — K529 Noninfective gastroenteritis and colitis, unspecified: Secondary | ICD-10-CM

## 2014-06-11 NOTE — Telephone Encounter (Signed)
Patient calling back, states she does not feel any better. Please advise

## 2014-06-11 NOTE — Assessment & Plan Note (Signed)
I think overall she is getting better. But she comes in again today. Will get cbc, cmp, and go ahead and get stool panel studies. If worsens with diarrhea again before weekend would consider course of cipro pending stool studies. Currently continue same management as advised on last visit.

## 2014-06-11 NOTE — Telephone Encounter (Signed)
FYI

## 2014-06-11 NOTE — Progress Notes (Signed)
Subjective:    Patient ID: [name], female    DOB: 1970-09-14, 43 y.o.   MRN: [id number]  HPI  Pt in with some vomiting last night. Pt did take zofran. No further vomiting. Pt still  has some stomach ache. Pt had some diarrhea today. Yesterday she had no diarrhea. So yesterday she did not take any immodium.  Pt diarrhea is improved overall but she had one very large loose stool this morniing. None since.  Pt is eating bland diet.  LMP- 31st.  Past Medical History  Diagnosis Date  . CML (chronic myelocytic leukemia) 2013  . 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 Glaucoma suspect     History   Social History  . Marital Status: Married    Spouse Name: Bradly Chris    Number of Children: 2  . Years of Education: N/A   Occupational History  . [employer]   Social History Main Topics  . Smoking status: Never Smoker   . Smokeless tobacco: Never Used     Comment: never used tobacco  . Alcohol Use: No  . Drug Use: No  . Sexual Activity: Yes    Partners: Male   Other Topics Concern  . Not on file   Social History Narrative   From [place]    2 children  --- 2006, 2012          Past Surgical History  Procedure Laterality Date  . Cesarean section  2006 & 2012  . Tubal ligation      Family History  Problem Relation Age of Onset  . Hypertension Maternal Grandmother   . Cancer Maternal Grandfather   . Heart disease Paternal Grandmother   . Stroke Paternal Grandfather   . Diabetes Neg Hx   . Colon cancer Neg Hx   . Breast cancer Neg Hx     No Known Allergies  Current Outpatient Prescriptions on File Prior to Visit  Medication Sig Dispense Refill  . dasatinib (SPRYCEL) 70 MG tablet Take 70 mg by mouth daily.      . Multiple Vitamin (MULTIVITAMIN WITH MINERALS) TABS tablet Take 1 tablet by mouth daily.      . ondansetron (ZOFRAN ODT) 8 MG disintegrating tablet Take 1 tablet (8 mg total) by mouth every 8 (eight) hours  as needed for nausea or vomiting.  20 tablet  0   No current facility-administered medications on file prior to visit.    BP 126/83  Pulse 83  Temp(Src) 99.1 F (37.3 C) (Oral)  Ht 5' 5.5" (1.664 m)  Wt 189 lb 9.6 oz (86.002 kg)  BMI 31.06 kg/m2  SpO2 100%  LMP 05/29/2014        Review of Systems  Constitutional: Negative for fever, chills and fatigue.  HENT: Negative.   Respiratory: Negative for cough, chest tightness, shortness of breath and wheezing.   Cardiovascular: Negative for chest pain and palpitations.  Gastrointestinal: Positive for nausea, vomiting and diarrhea. Negative for constipation, blood in stool, abdominal distention, anal bleeding and rectal pain.       See hop. Overall better. But still symptomatic and pt wants to be completely better.  Musculoskeletal: Negative.   Skin: Negative.   Neurological: Negative.   Hematological: Negative for adenopathy. Does not bruise/bleed easily.  Psychiatric/Behavioral: Negative.        Objective:   Physical Exam  General Appearance- Not in acute distress.  HEENT Eyes- Scleraeral/Conjuntiva-bilat- Not  Yellow. Mouth & Throat- Normal.  Chest and Lung Exam Auscultation: Breath sounds:-Normal. Adventitious sounds:- No Adventitious sounds.  Cardiovascular Auscultation:Rythm - Regular. Heart Sounds -Normal heart sounds.  Abdomen Inspection:-Inspection Normal.  Palpation/Perucssion: Palpation and Percussion of the abdomen reveal- minimal faint diffuse Tenderness, No Rebound tenderness, No rigidity(Guarding) and No Palpable abdominal masses.  Liver:-Normal. Faint minimal rt upper quadrant tender. Spleen:- Normal.   Back- no cva tenderness.  Skin- well hydrated. No tenting. Moist.        Assessment & Plan:

## 2014-06-11 NOTE — Telephone Encounter (Signed)
I called pt and no answer. I need to know exactly what is not better. Is she having more diarrhea?? Did she do labs and has she turned in stool tests. If you can get her on phone, I can talk with her. I would say important to do all the tests including turning in stool studies. Then maybe give antibiotics it depends what is her current predominant symptom.

## 2014-06-11 NOTE — Patient Instructions (Signed)
I think you continue with residual/persisting signs and symptoms from  viral gastroenteritis. I want you to continue same recommended treatment as before. But I do want you to get labs today and turn in stool studies as soon as possible. If your abdominal pain worsens or diarrhea worsens as on Monday notify us and would start antibiotics.  Follow up in 7 days or as needed.

## 2014-06-11 NOTE — Telephone Encounter (Signed)
Pt schedule acute appt for today 10.28.15 with Mackie Pai

## 2014-06-12 ENCOUNTER — Telehealth: Payer: Self-pay | Admitting: Medical

## 2014-06-12 LAB — COMPREHENSIVE METABOLIC PANEL
ALBUMIN: 3.5 g/dL (ref 3.5–5.2)
ALT: 28 U/L (ref 0–35)
AST: 28 U/L (ref 0–37)
Alkaline Phosphatase: 59 U/L (ref 39–117)
BUN: 9 mg/dL (ref 6–23)
CALCIUM: 8.4 mg/dL (ref 8.4–10.5)
CHLORIDE: 99 meq/L (ref 96–112)
CO2: 21 meq/L (ref 19–32)
Creatinine, Ser: 0.8 mg/dL (ref 0.4–1.2)
GFR: 80.12 mL/min (ref >60.00)
Glucose, Bld: 82 mg/dL (ref 70–99)
Potassium: 2.9 mEq/L — ABNORMAL LOW (ref 3.5–5.1)
Sodium: 134 mEq/L — ABNORMAL LOW (ref 135–145)
Total Bilirubin: 0.8 mg/dL (ref 0.2–1.2)
Total Protein: 7.5 g/dL (ref 6.0–8.3)

## 2014-06-12 LAB — CBC WITH DIFFERENTIAL/PLATELET
BASOS PCT: 0.3 % (ref 0.0–3.0)
Basophils Absolute: 0 10*3/uL (ref 0.0–0.1)
EOS ABS: 0 10*3/uL (ref 0.0–0.7)
Eosinophils Relative: 0.4 % (ref 0.0–5.0)
HCT: 37 % (ref 36.0–46.0)
Hemoglobin: 12.4 g/dL (ref 12.0–15.0)
Lymphocytes Relative: 18 % (ref 12.0–46.0)
Lymphs Abs: 0.5 10*3/uL — ABNORMAL LOW (ref 0.7–4.0)
MCHC: 33.4 g/dL (ref 30.0–36.0)
MCV: 92.3 fl (ref 78.0–100.0)
MONO ABS: 0.3 10*3/uL (ref 0.1–1.0)
Monocytes Relative: 12.2 % — ABNORMAL HIGH (ref 3.0–12.0)
Neutro Abs: 1.9 10*3/uL (ref 1.4–7.7)
Neutrophils Relative %: 69.1 % (ref 43.0–77.0)
Platelets: 138 10*3/uL — ABNORMAL LOW (ref 150.0–400.0)
RBC: 4 Mil/uL (ref 3.87–5.11)
RDW: 13.4 % (ref 11.5–15.5)
WBC: 2.7 10*3/uL — ABNORMAL LOW (ref 4.0–10.5)

## 2014-06-12 LAB — OVA AND PARASITE EXAMINATION: OP: NONE SEEN

## 2014-06-12 LAB — C. DIFFICILE GDH AND TOXIN A/B
C. difficile GDH: NOT DETECTED
C. difficile Toxin A/B: NOT DETECTED

## 2014-06-12 MED ORDER — POTASSIUM CHLORIDE CRYS ER 20 MEQ PO TBCR: 20.0000 meq | EXTENDED_RELEASE_TABLET | Freq: Every day | ORAL | Status: DC

## 2014-06-12 NOTE — Telephone Encounter (Signed)
Rx k-dur for hypokalemia. LPN will notify pt.

## 2014-06-13 ENCOUNTER — Telehealth: Payer: Self-pay | Admitting: Internal Medicine

## 2014-06-13 NOTE — Telephone Encounter (Signed)
Pt was seen by edward on Thursday would like to know if her lab results and stool culture is back.

## 2014-06-15 LAB — STOOL CULTURE

## 2014-06-16 ENCOUNTER — Telehealth: Payer: Self-pay

## 2014-06-16 ENCOUNTER — Encounter: Payer: Self-pay | Admitting: Medical

## 2014-06-16 NOTE — Telephone Encounter (Signed)
Called patient to let her know we are still awaiting results of stool culture

## 2014-06-16 NOTE — Telephone Encounter (Signed)
[facility]  Triage Call Report Triage Record Num: [id number] Operator: Pat Kocher Patient Name: [name] Call Date & Time: 06/14/2014 9:26:17AM Patient Phone: [phone] PCP: Agnes Lawrence Patient Gender: Female PCP Fax : [phone] Patient DOB: 11/24/1970 Practice Name: [facility]  Reason for Call: Caller: [name]/Patient; PCP: Lahoma Crocker; CB#: [phone]; Call regarding Diarrhea; LMP: 05/14/14 Seen in office on 06/09/14 and 06/11/14 due to diarrhea. Dx with virus and advised to take Imodium and Zofran for any nausea. 06/13/14 felt much better throughout the day. At night had episode had another episode of diarrhea, took 1 Imodium. 06/14/14 0730 onset of abdominal cramps, below navel every 30 min- 1 hour, similar to contraction. No further diarrhea, no nausea. Afebrile. Drinking well, eating bland foods. All emergent sxs ruled out as per Abdominal Pain Protocol with exception to 'New onset constant mild or aching lower abdominal pain.' Care advice given and advised pt to be seen within 24 hours. No appts available at [facility] office, recommended UC, pt agrees.  Protocol(s) Used: Abdominal Pain Recommended Outcome per Protocol: See Provider within 24 hours Reason for Outcome: New onset constant mild or aching lower abdominal pain Care Advice: ~

## 2014-06-16 NOTE — Telephone Encounter (Signed)
[name] [phone]  [name] called to see if her lab results were back yet.

## 2014-06-16 NOTE — Telephone Encounter (Signed)
Please advise 

## 2014-06-16 NOTE — Telephone Encounter (Signed)
Called patient advised lab results not back yet and we will call her when they are. Patient agreed.

## 2014-06-17 ENCOUNTER — Telehealth: Payer: Self-pay | Admitting: Internal Medicine

## 2014-06-17 NOTE — Telephone Encounter (Signed)
Receive a letter from [facility], the patient was referred there by her child psychologist. 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 They recommend possibly medical treatment for ADHD, depression and anxiety as well as psychotherapy.  Please call patient: Schedule an appointment at her convenience if she desires treatment

## 2014-06-17 NOTE — Telephone Encounter (Signed)
Spoke with Pt, she has F/U appt scheduled with Percell Miller on 06/20/2014 at 11:15??

## 2014-06-20 ENCOUNTER — Other Ambulatory Visit: Payer: Self-pay | Admitting: Hematology & Oncology

## 2014-06-20 ENCOUNTER — Encounter: Payer: Self-pay | Admitting: Internal Medicine

## 2014-06-20 ENCOUNTER — Ambulatory Visit: Payer: BC Managed Care – PPO | Admitting: Medical

## 2014-06-20 ENCOUNTER — Ambulatory Visit (INDEPENDENT_AMBULATORY_CARE_PROVIDER_SITE_OTHER): Payer: BC Managed Care – PPO | Admitting: Internal Medicine

## 2014-06-20 VITALS — BP 128/64 | HR 72 | Temp 98.0°F | Wt 187.1 lb

## 2014-06-20 DIAGNOSIS — F329 Major depressive disorder, single episode, unspecified: Secondary | ICD-10-CM

## 2014-06-20 DIAGNOSIS — F909 Attention-deficit hyperactivity disorder, unspecified type: Secondary | ICD-10-CM

## 2014-06-20 DIAGNOSIS — E876 Hypokalemia: Secondary | ICD-10-CM

## 2014-06-20 DIAGNOSIS — F419 Anxiety disorder, unspecified: Principal | ICD-10-CM

## 2014-06-20 DIAGNOSIS — F418 Other specified anxiety disorders: Secondary | ICD-10-CM

## 2014-06-20 DIAGNOSIS — F988 Other specified behavioral and emotional disorders with onset usually occurring in childhood and adolescence: Secondary | ICD-10-CM

## 2014-06-20 LAB — BASIC METABOLIC PANEL
BUN: 18 mg/dL (ref 6–23)
CHLORIDE: 104 meq/L (ref 96–112)
CO2: 26 mEq/L (ref 19–32)
CREATININE: 0.8 mg/dL (ref 0.4–1.2)
Calcium: 9.3 mg/dL (ref 8.4–10.5)
GFR: 81.25 mL/min (ref >60.00)
Glucose, Bld: 81 mg/dL (ref 70–99)
POTASSIUM: 4.2 meq/L (ref 3.5–5.1)
Sodium: 138 mEq/L (ref 135–145)

## 2014-06-20 MED ORDER — ESCITALOPRAM OXALATE 10 MG PO TABS: 10.0000 mg | ORAL_TABLET | Freq: Every day | ORAL | Status: DC

## 2014-06-20 NOTE — Patient Instructions (Addendum)
Get your blood work before you leave     Please come back to the office in 6 weeks  for a routine check up    Hydrocortisone OTC twice a day Metamucil OTC 2 capsules a day

## 2014-06-20 NOTE — Assessment & Plan Note (Signed)
Recently had a formal evaluation and she has ADHD, inattentive type. Plan is to treat anxiety and depression first and see if she still has residual ADH

## 2014-06-20 NOTE — Progress Notes (Signed)
Pre visit review using our clinic review tool, if applicable. No additional management support is needed unless otherwise documented below in the visit note. 

## 2014-06-20 NOTE — Assessment & Plan Note (Signed)
Anxiety depression. formally diagnosed with anxiety and depression although this is a ongoing issue for the patient, I suspect. Never try SSRIs Plan: Continue counseling Start Lexapro, side effects discussed including suicidality Return to the office 6 weeks

## 2014-06-20 NOTE — Progress Notes (Signed)
Subjective:    Patient ID: [name], female    DOB: Jun 19, 1971, 43 y.o.   MRN: [id number]  DOS:  06/20/2014 Type of visit - description : for elevation of depression and ADHD Interval history: Receive a letter from [facility], the patient was referred there by her child psychologist. 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 They recommend possibly medical treatment for ADHD, depression and anxiety as well as psychotherapy.  Here for further advice   Past Medical History  Diagnosis Date  . CML (chronic myelocytic leukemia) 2013  . 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 Glaucoma suspect     Past Surgical History  Procedure Laterality Date  . Cesarean section  2006 & 2012  . Tubal ligation      History   Social History  . Marital Status: Married    Spouse Name: Bradly Chris    Number of Children: 2  . Years of Education: N/A   Occupational History  . [employer]   Social History Main Topics  . Smoking status: Never Smoker   . Smokeless tobacco: Never Used     Comment: never used tobacco  . Alcohol Use: No  . Drug Use: No  . Sexual Activity:    Partners: Male   Other Topics Concern  . Not on file   Social History Narrative   From [place]    2 children  --- 2006, 2012              Medication List       This list is accurate as of: 06/20/14  6:42 PM.  Always use your most recent med list.               SPRYCEL 70 MG tablet  Generic drug:  dasatinib  TAKE 1 TABLET BY MOUTH DAILY.     dasatinib 70 MG tablet  Commonly known as:  SPRYCEL  Take 70 mg by mouth daily.     escitalopram 10 MG tablet  Commonly known as:  LEXAPRO  Take 1 tablet (10 mg total) by mouth daily.     multivitamin  with minerals Tabs tablet  Take 1 tablet by mouth daily.     ondansetron 8 MG disintegrating tablet  Commonly known as:  ZOFRAN ODT  Take 1 tablet (8 mg total) by mouth every 8 (eight) hours as needed for nausea or vomiting.     potassium chloride SA 20 MEQ tablet  Commonly known as:  K-DUR,KLOR-CON  Take 1 tablet (20 mEq total) by mouth daily.           Objective:   Physical Exam BP 128/64 mmHg  Pulse 72  Temp(Src) 98 F (36.7 C) (Oral)  Wt 187 lb 2 oz (84.879 kg)  SpO2 99%  LMP 05/14/2014  General -- alert, well-developed, NAD.   Neurologic--  alert & oriented X3. Speech normal, gait appropriate for age, strength symmetric and appropriate for age.  Psych-- Cognition and judgment appear intact. Cooperative with normal attention span and concentration. No anxious or depressed appearing.       Assessment & Plan:    Hypokalemia due to recent GI symptoms, symptoms resolve, status post potassium by mouth, check a BMP  Occasional hemorrhoids, see instructions

## 2014-07-31 ENCOUNTER — Ambulatory Visit (INDEPENDENT_AMBULATORY_CARE_PROVIDER_SITE_OTHER): Payer: BC Managed Care – PPO | Admitting: Medical

## 2014-07-31 ENCOUNTER — Ambulatory Visit (HOSPITAL_BASED_OUTPATIENT_CLINIC_OR_DEPARTMENT_OTHER)
Admission: RE | Admit: 2014-07-31 | Discharge: 2014-07-31 | Disposition: A | Discharge status: Home / Self Care | Payer: BC Managed Care – PPO | Source: Ambulatory Visit | Attending: Medical | Admitting: Medical

## 2014-07-31 ENCOUNTER — Encounter: Payer: Self-pay | Admitting: Medical

## 2014-07-31 ENCOUNTER — Other Ambulatory Visit: Payer: Self-pay

## 2014-07-31 VITALS — BP 118/77 | HR 92 | Temp 100.2°F | Ht 65.5 in | Wt 194.6 lb

## 2014-07-31 DIAGNOSIS — R05 Cough: Secondary | ICD-10-CM | POA: Insufficient documentation

## 2014-07-31 DIAGNOSIS — J029 Acute pharyngitis, unspecified: Secondary | ICD-10-CM | POA: Insufficient documentation

## 2014-07-31 DIAGNOSIS — J208 Acute bronchitis due to other specified organisms: Secondary | ICD-10-CM

## 2014-07-31 DIAGNOSIS — R509 Fever, unspecified: Secondary | ICD-10-CM | POA: Insufficient documentation

## 2014-07-31 DIAGNOSIS — R0602 Shortness of breath: Secondary | ICD-10-CM | POA: Insufficient documentation

## 2014-07-31 DIAGNOSIS — M549 Dorsalgia, unspecified: Secondary | ICD-10-CM | POA: Insufficient documentation

## 2014-07-31 DIAGNOSIS — R51 Headache: Secondary | ICD-10-CM | POA: Insufficient documentation

## 2014-07-31 DIAGNOSIS — J209 Acute bronchitis, unspecified: Secondary | ICD-10-CM | POA: Insufficient documentation

## 2014-07-31 DIAGNOSIS — R059 Cough, unspecified: Secondary | ICD-10-CM

## 2014-07-31 MED ORDER — BENZONATATE 100 MG PO CAPS: 100.0000 mg | ORAL_CAPSULE | Freq: Three times a day (TID) | ORAL | Status: DC | PRN

## 2014-07-31 MED ORDER — AMOXICILLIN-POT CLAVULANATE 875-125 MG PO TABS: 1.0000 | ORAL_TABLET | Freq: Two times a day (BID) | ORAL | Status: DC

## 2014-07-31 MED ORDER — FLUTICASONE PROPIONATE 50 MCG/ACT NA SUSP: 2.0000 | Freq: Every day | NASAL | Status: DC

## 2014-07-31 NOTE — Assessment & Plan Note (Signed)
You appear to have bronchitis. Rest hydrate and tylenol for fever. I am prescribing benzonatate  cough medicine, and augmentin antibiotic. For your nasal congestion, I will prescribe flonase nasal steroid.   I want you to get  chest xray today since you have some  Posterior lt lower rib region pain associated with illness and you do have fever.

## 2014-07-31 NOTE — Patient Instructions (Signed)
You appear to have bronchitis. Rest hydrate and tylenol for fever. I am prescribing benzonatate  cough medicine, and augmentin antibiotic. For your nasal congestion, I will prescribe flonase nasal steroid.   I want you to get  chest xray today since you have some  Posterior lt lower rib region pain associated with illness and you do have fever.   Follow up in 7-10 days or as needed

## 2014-07-31 NOTE — Progress Notes (Signed)
Subjective:    Patient ID: [name], female    DOB: [birth year], 43 y.o.   MRN: [id number]  HPI   Pt in today reporting  cough, sore throat, nasal congestion and runny nose for  14 Days.  Signs and symptoms have waxed and waned.Most recently cough is worse. Left lower back over lower rib area mild pain. Some sinus pressure. Mild minimal productive cough this morning.  Associated symptoms( below yes or no)  Fever-yes., today but subjective. Chills-yes Chest congestion-yes. Sneezing- no Itching eyes-no Sore throat- yes still. Post-nasal drainage-yes Wheezing- no  Purulent drainage-no Fatigue-mild.  LMP- September 30th. Pt had tubal ligation in the past. Pt will schedule with gyn for studies. Sounds like they plan to do fsh.   Past Medical History  Diagnosis Date  . CML (chronic myelocytic leukemia) 2013  . 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 Glaucoma suspect     History   Social History  . Marital Status: Married    Spouse Name: Bradly Chris    Number of Children: 2  . Years of Education: N/A   Occupational History  . [employer]   Social History Main Topics  . Smoking status: Never Smoker   . Smokeless tobacco: Never Used     Comment: never used tobacco  . Alcohol Use: No  . Drug Use: No  . Sexual Activity:    Partners: Male   Other Topics Concern  . Not on file   Social History Narrative   From [place]    2 children  --- 2006, 2012          Past Surgical History  Procedure Laterality Date  . Cesarean section  2006 & 2012  . Tubal ligation      Family History  Problem Relation Age of Onset  . Hypertension Maternal Grandmother   . Cancer Maternal Grandfather   . Heart disease Paternal Grandmother   . Stroke Paternal Grandfather   . Diabetes Neg Hx   . Colon cancer Neg Hx   . Breast cancer Neg Hx     No Known Allergies  Current Outpatient Prescriptions on File Prior to Visit  Medication Sig  Dispense Refill  . dasatinib (SPRYCEL) 70 MG tablet Take 70 mg by mouth daily.    Marland Kitchen escitalopram (LEXAPRO) 10 MG tablet Take 1 tablet (10 mg total) by mouth daily. 30 tablet 1  . Multiple Vitamin (MULTIVITAMIN WITH MINERALS) TABS tablet Take 1 tablet by mouth daily.    . SPRYCEL 70 MG tablet TAKE 1 TABLET BY MOUTH DAILY. 90 tablet 1   No current facility-administered medications on file prior to visit.    BP 118/77 mmHg  Pulse 92  Temp(Src) 100.2 F (37.9 C) (Oral)  Ht 5' 5.5" (1.664 m)  Wt 194 lb 9.6 oz (88.27 kg)  BMI 31.88 kg/m2  SpO2 100%      Review of Systems  Constitutional: Positive for fever. Negative for chills and fatigue.  HENT: Positive for congestion, postnasal drip, rhinorrhea, sinus pressure and sore throat.   Respiratory: Positive for cough. Negative for wheezing.   Cardiovascular: Negative for chest pain and palpitations.  Musculoskeletal: Positive for back pain. Negative for neck pain.       Left lower rib back pain recently with cough.  Neurological: Negative for dizziness and headaches.  Hematological: Negative for adenopathy. Does not bruise/bleed easily.       Objective:  Physical Exam   General  Mental Status - Alert. General Appearance - Well groomed. Not in acute distress.  Skin Rashes- No Rashes.  HEENT Head- Normal. Ear Auditory Canal - Left- Normal. Right - Normal.Tympanic Membrane- Left- Normal. Right- Normal. Eye Sclera/Conjunctiva- Left- Normal. Right- Normal. Nose & Sinuses Nasal Mucosa- Left-   boggy + Congested. Right-  boggy + Congested. no sinus pressure on palpation  Mouth & Throat Lips: Upper Lip- Normal: no dryness, cracking, pallor, cyanosis, or vesicular eruption. Lower Lip-Normal: no dryness, cracking, pallor, cyanosis or vesicular eruption. Buccal Mucosa- Bilateral- No Aphthous ulcers. Oropharynx- No Discharge or Erythema. Tonsils: Characteristics- Bilateral- No Erythema or Congestion. Size/Enlargement- Bilateral- No  enlargement. Discharge- bilateral-None.  Neck Neck- Supple. No Masses.   Chest and Lung Exam Auscultation: Breath Sounds:- even and unlabored, but bilateral upper lobe rhonchi.  Cardiovascular Auscultation:Rythm- Regular, rate and rhythm. Murmurs & Other Heart Sounds:Ausculatation of the heart reveal- No Murmurs.  Lymphatic Head & Neck General Head & Neck Lymphatics: Bilateral: Description- No Localized lymphadenopathy.         Assessment & Plan:

## 2014-07-31 NOTE — Progress Notes (Signed)
Pre visit review using our clinic review tool, if applicable. No additional management support is needed unless otherwise documented below in the visit note. 

## 2014-08-06 ENCOUNTER — Ambulatory Visit (INDEPENDENT_AMBULATORY_CARE_PROVIDER_SITE_OTHER): Payer: BC Managed Care – PPO | Admitting: Internal Medicine

## 2014-08-06 ENCOUNTER — Encounter: Payer: Self-pay | Admitting: Internal Medicine

## 2014-08-06 VITALS — BP 104/69 | HR 80 | Temp 98.2°F | Ht 66.0 in | Wt 194.5 lb

## 2014-08-06 DIAGNOSIS — F909 Attention-deficit hyperactivity disorder, unspecified type: Secondary | ICD-10-CM

## 2014-08-06 DIAGNOSIS — F419 Anxiety disorder, unspecified: Secondary | ICD-10-CM

## 2014-08-06 DIAGNOSIS — F329 Major depressive disorder, single episode, unspecified: Secondary | ICD-10-CM

## 2014-08-06 DIAGNOSIS — F418 Other specified anxiety disorders: Secondary | ICD-10-CM

## 2014-08-06 DIAGNOSIS — F988 Other specified behavioral and emotional disorders with onset usually occurring in childhood and adolescence: Secondary | ICD-10-CM

## 2014-08-06 MED ORDER — ESCITALOPRAM OXALATE 10 MG PO TABS: 10.0000 mg | ORAL_TABLET | Freq: Every day | ORAL | Status: DC

## 2014-08-06 MED ORDER — AMPHETAMINE-DEXTROAMPHETAMINE 10 MG PO TABS: 10.0000 mg | ORAL_TABLET | Freq: Every day | ORAL | Status: DC

## 2014-08-06 NOTE — Progress Notes (Signed)
Pre visit review using our clinic review tool, if applicable. No additional management support is needed unless otherwise documented below in the visit note. 

## 2014-08-06 NOTE — Assessment & Plan Note (Signed)
Started Lexapro, some improvement, less irritable feels more emotionally "balance". Denies side effects. Plan: Continue Lexapro for now.

## 2014-08-06 NOTE — Progress Notes (Signed)
Subjective:    Patient ID: [name], female    DOB: January 22, 1971, 43 y.o.   MRN: [id number]  DOS:  08/06/2014 Type of visit - description : f/u Interval history: Follow-up, started Lexapro, it is helping to some extent. ADD, continue with symptoms this time despite improving on anxiety and depression.   ROS Denies suicidal ideas Sleeps well most nights.   Past Medical History  Diagnosis Date  . CML (chronic myelocytic leukemia) 2013  . 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 Glaucoma suspect   . Anxiety and depression 04/02/2013    Past Surgical History  Procedure Laterality Date  . Cesarean section  2006 & 2012  . Tubal ligation      History   Social History  . Marital Status: Married    Spouse Name: Bradly Chris    Number of Children: 2  . Years of Education: N/A   Occupational History  . [employer]   Social History Main Topics  . Smoking status: Never Smoker   . Smokeless tobacco: Never Used     Comment: never used tobacco  . Alcohol Use: No  . Drug Use: No  . Sexual Activity:    Partners: Male   Other Topics Concern  . Not on file   Social History Narrative   From [place]    2 children  --- 2006, 2012              Medication List       This list is accurate as of: 08/06/14 11:59 PM.  Always use your most recent med list.               amoxicillin-clavulanate 875-125 MG per tablet  Commonly known as:  AUGMENTIN  Take 1 tablet by mouth 2 (two) times daily.     amphetamine-dextroamphetamine 10 MG tablet  Commonly known as:  ADDERALL  Take 1-2 tablets (10-20 mg total) by mouth daily with breakfast.     benzonatate 100 MG capsule  Commonly known as:  TESSALON  Take 1 capsule (100 mg total) by mouth 3 (three) times daily as needed for cough.     SPRYCEL 70 MG tablet  Generic drug:  dasatinib  TAKE 1 TABLET BY MOUTH DAILY.     dasatinib 70 MG tablet  Commonly known as:  SPRYCEL  Take 70 mg by  mouth daily.     escitalopram 10 MG tablet  Commonly known as:  LEXAPRO  Take 1 tablet (10 mg total) by mouth daily.     fluticasone 50 MCG/ACT nasal spray  Commonly known as:  FLONASE  Place 2 sprays into both nostrils daily.     multivitamin with minerals Tabs tablet  Take 1 tablet by mouth daily.           Objective:   Physical Exam BP 104/69 mmHg  Pulse 80  Temp(Src) 98.2 F (36.8 C) (Oral)  Ht 5\' 6"  (1.676 m)  Wt 194 lb 8 oz (88.225 kg)  BMI 31.41 kg/m2  SpO2 97%  LMP 04/15/2014 General -- alert, well-developed, NAD.   Neurologic--  alert & oriented X3. Speech normal, gait appropriate for age, strength symmetric and appropriate for age.  Psych-- Cognition and judgment appear intact. Cooperative with normal attention span and concentration. No anxious or depressed appearing.        Assessment & Plan:  Today , I spent more than 25   min  with the patient: >50% of the time counseling regards the initiation of Adderall, side effects, how to use the medication and how to self adjust the timing of adderall intake

## 2014-08-06 NOTE — Patient Instructions (Signed)
Continue Lexapro  Take Adderall 1 or 2 tablets in the morning before work as needed   you can take 1 tablet in the morning and 1 tablet at around noon if you think that is better   Call in 3-4 weeks and let me know how this is working, we can always adjust your medication.  Next visit in 3 months

## 2014-08-06 NOTE — Assessment & Plan Note (Addendum)
Despite treatment with SSRIs, she continue with ADD symptoms including feeling unfocused, procrastination. Recommend to continue working with her counselor, she is already taking a number of steps to help concentration. We'll start Adderall 10 mg 1 or 2 in the morning. discussed side effects and what to expect. Okay to change to 1 by mouth twice a day if she feels that works better than take all the medication in the morning ( or even try 2 tabs in AM and 1 tab at noon in the future). We'll start a low-dose, she will call in few weeks and if needed will adjust her medication

## 2014-08-11 ENCOUNTER — Encounter: Payer: Self-pay | Admitting: *Deleted

## 2014-08-16 ENCOUNTER — Other Ambulatory Visit: Payer: Self-pay | Admitting: Internal Medicine

## 2014-08-19 ENCOUNTER — Encounter: Payer: Self-pay | Admitting: Obstetrics

## 2014-08-19 ENCOUNTER — Ambulatory Visit (INDEPENDENT_AMBULATORY_CARE_PROVIDER_SITE_OTHER): Payer: BC Managed Care – PPO | Admitting: Obstetrics

## 2014-08-19 ENCOUNTER — Telehealth: Payer: Self-pay | Admitting: *Deleted

## 2014-08-19 ENCOUNTER — Other Ambulatory Visit: Payer: Self-pay | Admitting: *Deleted

## 2014-08-19 VITALS — BP 109/64 | HR 71 | Wt 195.0 lb

## 2014-08-19 DIAGNOSIS — N912 Amenorrhea, unspecified: Secondary | ICD-10-CM

## 2014-08-19 MED ORDER — PROGESTERONE MICRONIZED 200 MG PO CAPS: 400.0000 mg | ORAL_CAPSULE | Freq: Every day | ORAL | Status: DC

## 2014-08-19 NOTE — Telephone Encounter (Signed)
Prior authorization approved for Adderall effective 07/29/2014 through 08/19/2015.  [id number]

## 2014-08-19 NOTE — Progress Notes (Signed)
Patient ID: [name], female   DOB: 1970-11-30, 44 y.o.   MRN: [id number]  Chief Complaint  Patient presents with  . Menstrual Problem    no cycle since end of September    HPI [name] is a 44 y.o. female.  No period since September.  H/O Chronic Myelocytic Leukemia (CML), diagnosed in 2013, on Sprycel, in remission. HPI  Past Medical History  Diagnosis Date  . CML (chronic myelocytic leukemia) 2013  . 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 Glaucoma suspect   . Anxiety and depression 04/02/2013    Past Surgical History  Procedure Laterality Date  . Cesarean section  2006 & 2012  . Tubal ligation      Family History  Problem Relation Age of Onset  . Hypertension Maternal Grandmother   . Cancer Maternal Grandfather   . Heart disease Paternal Grandmother   . Stroke Paternal Grandfather   . Diabetes Neg Hx   . Colon cancer Neg Hx   . Breast cancer Neg Hx     Social History History  Substance Use Topics  . Smoking status: Never Smoker   . Smokeless tobacco: Never Used     Comment: never used tobacco  . Alcohol Use: No    No Known Allergies  Current Outpatient Prescriptions  Medication Sig Dispense Refill  . dasatinib (SPRYCEL) 70 MG tablet Take 70 mg by mouth daily.    Marland Kitchen escitalopram (LEXAPRO) 10 MG tablet Take 1 tablet (10 mg total) by mouth daily. 30 tablet 6  . amoxicillin-clavulanate (AUGMENTIN) 875-125 MG per tablet Take 1 tablet by mouth 2 (two) times daily. (Patient not taking: Reported on 08/19/2014) 20 tablet 0  . amphetamine-dextroamphetamine (ADDERALL) 10 MG tablet Take 1-2 tablets (10-20 mg total) by mouth daily with breakfast. (Patient not taking: Reported on 08/19/2014) 60 tablet 0  . benzonatate (TESSALON) 100 MG capsule Take 1 capsule (100 mg total) by mouth 3 (three) times daily as needed for cough. (Patient not taking: Reported on 08/19/2014) 21 capsule 0  . fluticasone (FLONASE) 50 MCG/ACT nasal spray Place 2 sprays into  both nostrils daily. (Patient not taking: Reported on 08/19/2014) 16 g 1  . Multiple Vitamin (MULTIVITAMIN WITH MINERALS) TABS tablet Take 1 tablet by mouth daily.    . progesterone (PROMETRIUM) 200 MG capsule Take 2 capsules (400 mg total) by mouth at bedtime. 20 capsule 0  . SPRYCEL 70 MG tablet TAKE 1 TABLET BY MOUTH DAILY. 90 tablet 1   No current facility-administered medications for this visit.    Review of Systems Review of Systems Constitutional: negative for fatigue and weight loss Respiratory: negative for cough and wheezing Cardiovascular: negative for chest pain, fatigue and palpitations Gastrointestinal: negative for abdominal pain and change in bowel habits Genitourinary: no period since September Integument/breast: negative for nipple discharge Musculoskeletal:negative for myalgias Neurological: negative for gait problems and tremors Behavioral/Psych: negative for abusive relationship, depression Endocrine: negative for temperature intolerance     Blood pressure 109/64, pulse 71, weight 195 lb (88.451 kg), last menstrual period 05/13/2014.  Physical Exam Physical Exam:  Deferred  100% of 10 min visit spent on counseling and coordination of care.   Data Reviewed Labs  Assessment    Amenorrhea H/O CML, on Sprycel, in remission.     Plan    Prometrium challenge Rx  F/U 6 weeks  FSH / E2 if no menses.  No orders of the defined types were  placed in this encounter.   Meds ordered this encounter  Medications  . progesterone (PROMETRIUM) 200 MG capsule    Sig: Take 2 capsules (400 mg total) by mouth at bedtime.    Dispense:  20 capsule    Refill:  0        Sayla Golonka A 08/19/2014, 5:10 PM

## 2014-10-02 ENCOUNTER — Ambulatory Visit (INDEPENDENT_AMBULATORY_CARE_PROVIDER_SITE_OTHER): Payer: BC Managed Care – PPO | Admitting: Obstetrics

## 2014-10-02 DIAGNOSIS — N912 Amenorrhea, unspecified: Secondary | ICD-10-CM

## 2014-10-02 NOTE — Progress Notes (Signed)
Doctor is out of the office for delivery. Patient reports she did the challenge with the Prometrium and did not have a cycle. Patient to have labs drawn today and reschedule for follow up.   Patient not seen by physician.  Baltazar Najjar MD

## 2014-10-03 ENCOUNTER — Encounter: Payer: Self-pay | Admitting: Obstetrics

## 2014-10-03 LAB — FOLLICLE STIMULATING HORMONE: FSH: 33.4 m[IU]/mL

## 2014-10-03 LAB — ESTRADIOL: Estradiol: 79 pg/mL

## 2014-10-06 ENCOUNTER — Ambulatory Visit (HOSPITAL_COMMUNITY)
Admission: RE | Admit: 2014-10-06 | Discharge: 2014-10-06 | Disposition: A | Discharge status: Home / Self Care | Payer: BC Managed Care – PPO | Source: Ambulatory Visit | Attending: Family | Admitting: Family

## 2014-10-06 DIAGNOSIS — Z1231 Encounter for screening mammogram for malignant neoplasm of breast: Secondary | ICD-10-CM | POA: Insufficient documentation

## 2014-10-06 DIAGNOSIS — C9210 Chronic myeloid leukemia, BCR/ABL-positive, not having achieved remission: Secondary | ICD-10-CM

## 2014-10-08 ENCOUNTER — Ambulatory Visit: Payer: BC Managed Care – PPO | Admitting: Obstetrics

## 2014-10-10 ENCOUNTER — Telehealth: Payer: Self-pay | Admitting: Internal Medicine

## 2014-10-10 MED ORDER — AMPHETAMINE-DEXTROAMPHETAMINE 10 MG PO TABS: 10.0000 mg | ORAL_TABLET | Freq: Every day | ORAL | Status: DC

## 2014-10-10 NOTE — Telephone Encounter (Signed)
Caller name: paticia Relation to pt: self Call back number:  [phone]  Pharmacy:  Reason for call:   Patient needs new adderall

## 2014-10-10 NOTE — Telephone Encounter (Signed)
done

## 2014-10-10 NOTE — Telephone Encounter (Signed)
Pt is requesting refill on Adderall:  Last OV: 08/06/2014 Last Fill: 08/06/2014 # [id number] UDS: None  Please advise.

## 2014-10-13 ENCOUNTER — Ambulatory Visit (INDEPENDENT_AMBULATORY_CARE_PROVIDER_SITE_OTHER): Payer: BC Managed Care – PPO | Admitting: Obstetrics

## 2014-10-13 VITALS — BP 113/71 | HR 67 | Temp 98.4°F | Wt 197.0 lb

## 2014-10-13 DIAGNOSIS — N912 Amenorrhea, unspecified: Secondary | ICD-10-CM | POA: Diagnosis not present

## 2014-10-13 DIAGNOSIS — C9290 Myeloid leukemia, unspecified, not having achieved remission: Secondary | ICD-10-CM

## 2014-10-13 DIAGNOSIS — C9210 Chronic myeloid leukemia, BCR/ABL-positive, not having achieved remission: Secondary | ICD-10-CM

## 2014-10-13 NOTE — Telephone Encounter (Signed)
LMOM informing Pt that Rx is ready for pick up at front desk.

## 2014-10-14 ENCOUNTER — Encounter: Payer: Self-pay | Admitting: Obstetrics

## 2014-10-14 NOTE — Progress Notes (Signed)
Patient ID: [name], female   DOB: 1970/08/26, 44 y.o.   MRN: [id number]  Chief Complaint  Patient presents with  . Follow-up    lab results    HPI [name] is a 44 y.o. female.  No period since starting chemotherapy for CML.  HPI  Past Medical History  Diagnosis Date  . CML (chronic myelocytic leukemia) 2013  . 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 Glaucoma suspect   . Anxiety and depression 04/02/2013    Past Surgical History  Procedure Laterality Date  . Cesarean section  2006 & 2012  . Tubal ligation      Family History  Problem Relation Age of Onset  . Hypertension Maternal Grandmother   . Cancer Maternal Grandfather   . Heart disease Paternal Grandmother   . Stroke Paternal Grandfather   . Diabetes Neg Hx   . Colon cancer Neg Hx   . Breast cancer Neg Hx     Social History History  Substance Use Topics  . Smoking status: Never Smoker   . Smokeless tobacco: Never Used     Comment: never used tobacco  . Alcohol Use: No    No Known Allergies  Current Outpatient Prescriptions  Medication Sig Dispense Refill  . amphetamine-dextroamphetamine (ADDERALL) 10 MG tablet Take 1-2 tablets (10-20 mg total) by mouth daily with breakfast. 60 tablet 0  . dasatinib (SPRYCEL) 70 MG tablet Take 70 mg by mouth daily.    Marland Kitchen escitalopram (LEXAPRO) 10 MG tablet Take 1 tablet (10 mg total) by mouth daily. 30 tablet 6  . Multiple Vitamin (MULTIVITAMIN WITH MINERALS) TABS tablet Take 1 tablet by mouth daily.    Marland Kitchen amoxicillin-clavulanate (AUGMENTIN) 875-125 MG per tablet Take 1 tablet by mouth 2 (two) times daily. (Patient not taking: Reported on 08/19/2014) 20 tablet 0  . benzonatate (TESSALON) 100 MG capsule Take 1 capsule (100 mg total) by mouth 3 (three) times daily as needed for cough. (Patient not taking: Reported on 08/19/2014) 21 capsule 0  . fluticasone (FLONASE) 50 MCG/ACT nasal spray Place 2 sprays into both nostrils daily. (Patient not taking:  Reported on 08/19/2014) 16 g 1  . progesterone (PROMETRIUM) 200 MG capsule Take 2 capsules (400 mg total) by mouth at bedtime. (Patient not taking: Reported on 10/13/2014) 20 capsule 0  . SPRYCEL 70 MG tablet TAKE 1 TABLET BY MOUTH DAILY. 90 tablet 1   No current facility-administered medications for this visit.    Review of Systems Review of Systems Constitutional: negative for fatigue and weight loss Respiratory: negative for cough and wheezing Cardiovascular: negative for chest pain, fatigue and palpitations Gastrointestinal: negative for abdominal pain and change in bowel habits Genitourinary: no periods Integument/breast: negative for nipple discharge Musculoskeletal:negative for myalgias Neurological: negative for gait problems and tremors Behavioral/Psych: negative for abusive relationship, depression Endocrine: negative for temperature intolerance     Blood pressure 113/71, pulse 67, temperature 98.4 F (36.9 C), weight 197 lb (89.359 kg).  Physical Exam Physical Exam General:   alert  Skin:   no rash or abnormalities  Lungs:   clear to auscultation bilaterally  Heart:   regular rate and rhythm, S1, S2 normal, no murmur, click, rub or gallop  Breasts:   normal without suspicious masses, skin or nipple changes or axillary nodes  Abdomen:  normal findings: no organomegaly, soft, non-tender and no hernia  Pelvis:  External genitalia: normal general appearance Urinary system: urethral meatus normal  and bladder without fullness, nontender Vaginal: normal without tenderness, induration or masses Cervix: normal appearance Adnexa: normal bimanual exam Uterus: anteverted and non-tender, normal size    100% of 10 min visit spent on counseling and coordination of care.   Data Reviewed Labs: E2, TSH  Assessment     Amenorrhea, on chemotherapy for leukemia.     Plan    Will follow clinically.  Probable normal response to chemotherapy.  F/U 3 months  No orders of the  defined types were placed in this encounter.   No orders of the defined types were placed in this encounter.

## 2014-10-31 ENCOUNTER — Encounter: Payer: Self-pay | Admitting: Hematology & Oncology

## 2014-11-06 ENCOUNTER — Other Ambulatory Visit: Payer: Self-pay

## 2014-11-10 ENCOUNTER — Ambulatory Visit (INDEPENDENT_AMBULATORY_CARE_PROVIDER_SITE_OTHER): Payer: BC Managed Care – PPO | Admitting: Internal Medicine

## 2014-11-10 ENCOUNTER — Other Ambulatory Visit (HOSPITAL_BASED_OUTPATIENT_CLINIC_OR_DEPARTMENT_OTHER): Payer: BC Managed Care – PPO

## 2014-11-10 ENCOUNTER — Encounter: Payer: Self-pay | Admitting: Internal Medicine

## 2014-11-10 VITALS — BP 122/64 | HR 69 | Temp 98.0°F | Ht 66.0 in | Wt 201.1 lb

## 2014-11-10 DIAGNOSIS — F418 Other specified anxiety disorders: Secondary | ICD-10-CM

## 2014-11-10 DIAGNOSIS — C9210 Chronic myeloid leukemia, BCR/ABL-positive, not having achieved remission: Secondary | ICD-10-CM

## 2014-11-10 DIAGNOSIS — F909 Attention-deficit hyperactivity disorder, unspecified type: Secondary | ICD-10-CM

## 2014-11-10 DIAGNOSIS — F329 Major depressive disorder, single episode, unspecified: Secondary | ICD-10-CM

## 2014-11-10 DIAGNOSIS — C9211 Chronic myeloid leukemia, BCR/ABL-positive, in remission: Secondary | ICD-10-CM

## 2014-11-10 DIAGNOSIS — F419 Anxiety disorder, unspecified: Secondary | ICD-10-CM

## 2014-11-10 DIAGNOSIS — F988 Other specified behavioral and emotional disorders with onset usually occurring in childhood and adolescence: Secondary | ICD-10-CM

## 2014-11-10 LAB — COMPREHENSIVE METABOLIC PANEL
ALK PHOS: 47 U/L (ref 39–117)
ALT: 16 U/L (ref 0–35)
AST: 20 U/L (ref 0–37)
Albumin: 4.2 g/dL (ref 3.5–5.2)
BILIRUBIN TOTAL: 0.3 mg/dL (ref 0.2–1.2)
BUN: 17 mg/dL (ref 6–23)
CO2: 26 mEq/L (ref 19–32)
Calcium: 8.8 mg/dL (ref 8.4–10.5)
Chloride: 105 mEq/L (ref 96–112)
Creatinine, Ser: 0.71 mg/dL (ref 0.50–1.10)
Glucose, Bld: 80 mg/dL (ref 70–99)
Potassium: 4.3 mEq/L (ref 3.5–5.3)
SODIUM: 138 meq/L (ref 135–145)
Total Protein: 6.6 g/dL (ref 6.0–8.3)

## 2014-11-10 LAB — CBC WITH DIFFERENTIAL (CANCER CENTER ONLY)
BASO#: 0 10*3/uL (ref 0.0–0.2)
BASO%: 0.6 % (ref 0.0–2.0)
EOS ABS: 0 10*3/uL (ref 0.0–0.5)
EOS%: 0.6 % (ref 0.0–7.0)
HEMATOCRIT: 35.1 % (ref 34.8–46.6)
HEMOGLOBIN: 11.7 g/dL (ref 11.6–15.9)
LYMPH#: 1.2 10*3/uL (ref 0.9–3.3)
LYMPH%: 35.2 % (ref 14.0–48.0)
MCH: 32.7 pg (ref 26.0–34.0)
MCHC: 33.3 g/dL (ref 32.0–36.0)
MCV: 98 fL (ref 81–101)
MONO#: 0.2 10*3/uL (ref 0.1–0.9)
MONO%: 6.4 % (ref 0.0–13.0)
NEUT#: 2 10*3/uL (ref 1.5–6.5)
NEUT%: 57.2 % (ref 39.6–80.0)
Platelets: 162 10*3/uL (ref 145–400)
RBC: 3.58 10*6/uL — ABNORMAL LOW (ref 3.70–5.32)
RDW: 13 % (ref 11.1–15.7)
WBC: 3.4 10*3/uL — ABNORMAL LOW (ref 3.9–10.0)

## 2014-11-10 LAB — LACTATE DEHYDROGENASE: LDH: 101 U/L (ref 94–250)

## 2014-11-10 MED ORDER — AMPHETAMINE-DEXTROAMPHETAMINE 10 MG PO TABS: 30.0000 mg | ORAL_TABLET | Freq: Every day | ORAL | Status: DC

## 2014-11-10 MED ORDER — FLUOXETINE HCL 20 MG PO TABS: 40.0000 mg | ORAL_TABLET | Freq: Every day | ORAL | Status: DC

## 2014-11-10 NOTE — Progress Notes (Signed)
Subjective:    Patient ID: [name], female    DOB: 11/07/1970, 44 y.o.   MRN: [id number]  DOS:  11/10/2014 Type of visit - description : f/u  Interval history: ADD, started adderall  10 mg twice a day, initially help significantly better but after a month she doesn't feel much of a difference. She is also undergoing couples counseling, her counselor noted that she is very anxious and wonders about increasing or changing her medication.    Review of Systems Depression is  mostly well controlled. No suicidal ideas Admits to low energy, gaining weight, has not been able to exercise much. Does not know if she snores a lot.  Past Medical History  Diagnosis Date  . CML (chronic myelocytic leukemia) 2013  . 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 Glaucoma suspect   . Anxiety and depression 04/02/2013    Past Surgical History  Procedure Laterality Date  . Cesarean section  2006 & 2012  . Tubal ligation      History   Social History  . Marital Status: Married    Spouse Name: Bradly Chris  . Number of Children: 2  . Years of Education: N/A   Occupational History  . [employer]   Social History Main Topics  . Smoking status: Never Smoker   . Smokeless tobacco: Never Used     Comment: never used tobacco  . Alcohol Use: No  . Drug Use: No  . Sexual Activity:    Partners: Male   Other Topics Concern  . Not on file   Social History Narrative   From [place]    2 children  --- 2006, 2012              Medication List       This list is accurate as of: 11/10/14  1:30 PM.  Always use your most recent med list.               amphetamine-dextroamphetamine 10 MG tablet  Commonly known as:  ADDERALL  Take 1-2 tablets (10-20 mg total) by mouth daily with breakfast.     dasatinib 70 MG tablet  Commonly known as:  SPRYCEL  Take 70 mg by mouth daily.     escitalopram 10 MG tablet  Commonly known as:  LEXAPRO  Take 1 tablet  (10 mg total) by mouth daily.     fluticasone 50 MCG/ACT nasal spray  Commonly known as:  FLONASE  Place 2 sprays into both nostrils daily.     multivitamin with minerals Tabs tablet  Take 1 tablet by mouth daily.     progesterone 200 MG capsule  Commonly known as:  PROMETRIUM  Take 2 capsules (400 mg total) by mouth at bedtime.           Objective:   Physical Exam BP 122/64 mmHg  Pulse 69  Temp(Src) 98 F (36.7 C) (Oral)  Ht 5\' 6"  (1.676 m)  Wt 201 lb 2 oz (91.23 kg)  BMI 32.48 kg/m2  SpO2 97%  General:   Well developed, well nourished . NAD.  HEENT:  Normocephalic . Face symmetric, atraumatic Skin: Not pale. Not jaundice Neurologic:  alert & oriented X3.  Speech normal, gait appropriate for age and unassisted Psych--  Cognition and judgment appear intact.  Cooperative with normal attention span and concentration.  Behavior appropriate. No anxious or depressed appearing.       Assessment &  Plan:

## 2014-11-10 NOTE — Assessment & Plan Note (Signed)
Depression is well-controlled  But there is room for improvement on anxity. She is concerned about weight gain. Plan: Switch from Lexapro to Prozac (less likely to cause wt gain. Target dose is 30 mg daily, increased to 40 if necessary, see instructions. Reassess in 2 months. Continue with couples counseling. Also complaining about fatigue, will reassess and return to the office.

## 2014-11-10 NOTE — Progress Notes (Signed)
Pre visit review using our clinic review tool, if applicable. No additional management support is needed unless otherwise documented below in the visit note. 

## 2014-11-10 NOTE — Patient Instructions (Signed)
Stop Lexapro Start fluoxetine 20 mg: 1 tablet daily for 10 days, then 1.5 tablets every day. After a month, okay to increase to 2 tablets daily if needed    New  Adderall dose : 2 tablets in the morning and 1 in the afternoon  Next visit 2 months

## 2014-11-10 NOTE — Assessment & Plan Note (Signed)
Did okay with 10 mg twice a day but thinks there is room for improvement, increase adderall to 30 mg daily, okay to take 20 in the morning and 10 at PM. Call if the need to titrate the medication up

## 2014-11-11 LAB — FERRITIN CHCC: Ferritin: 189 ng/ml (ref 9–269)

## 2014-11-11 LAB — IRON AND TIBC CHCC
%SAT: 23 % (ref 21–57)
Iron: 67 ug/dL (ref 41–142)
TIBC: 286 ug/dL (ref 236–444)
UIBC: 220 ug/dL (ref 120–384)

## 2014-11-19 ENCOUNTER — Other Ambulatory Visit: Payer: Self-pay

## 2014-11-21 ENCOUNTER — Other Ambulatory Visit: Payer: Self-pay | Admitting: *Deleted

## 2014-11-21 DIAGNOSIS — C9210 Chronic myeloid leukemia, BCR/ABL-positive, not having achieved remission: Secondary | ICD-10-CM

## 2014-11-24 ENCOUNTER — Ambulatory Visit (HOSPITAL_BASED_OUTPATIENT_CLINIC_OR_DEPARTMENT_OTHER): Payer: BC Managed Care – PPO | Admitting: Family

## 2014-11-24 ENCOUNTER — Encounter: Payer: Self-pay | Admitting: Family

## 2014-11-24 ENCOUNTER — Other Ambulatory Visit: Payer: BC Managed Care – PPO

## 2014-11-24 VITALS — BP 112/57 | HR 73 | Temp 98.0°F | Resp 16 | Ht 66.0 in | Wt 198.0 lb

## 2014-11-24 DIAGNOSIS — R5383 Other fatigue: Secondary | ICD-10-CM

## 2014-11-24 DIAGNOSIS — C9211 Chronic myeloid leukemia, BCR/ABL-positive, in remission: Secondary | ICD-10-CM | POA: Diagnosis not present

## 2014-11-24 DIAGNOSIS — C9210 Chronic myeloid leukemia, BCR/ABL-positive, not having achieved remission: Secondary | ICD-10-CM

## 2014-11-24 NOTE — Progress Notes (Signed)
Hematology and Oncology Follow Up Visit  DARRIEN LAAKSO [id number] 07/22/1971 44 y.o. 11/24/2014   Principle Diagnosis:  Chronic phase CML-molecular remission  Current Therapy:   Sprycel 70 mg by mouth daily    Interim History: Ms. Crammer is here today for a follow-up. She is still feeling very tired. She is under a lot of stress with her job. 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  She has had no issues with the Sprycel and seems to be responding well to it.  She is still having heavy cycles and sees her gynecologist regularly. Her mammogram in February was negative.  No problems with infections. She denies fever, chills, n/v, cough, headache, dizziness, SOB, chest pain, palpitations, constipation, blood in urine or stool.  Her appetite is good and she is staying hydrated. Her weight is stable.  She has had no swelling, tenderness, numbness or tingling in her extremities. In March her BCR/ABL analysis was negative. She still appears to be in major molecular remission.   Medications:    Medication List       This list is accurate as of: 11/24/14  3:56 PM.  Always use your most recent med list.               amphetamine-dextroamphetamine 10 MG tablet  Commonly known as:  ADDERALL  Take 3 tablets (30 mg total) by mouth daily with breakfast.     dasatinib 70 MG tablet  Commonly known as:  SPRYCEL  Take 70 mg by mouth daily.     FLUoxetine 20 MG tablet  Commonly known as:  PROZAC  Take 2 tablets (40 mg total) by mouth daily.     multivitamin with minerals Tabs tablet  Take 1 tablet by mouth daily.        Allergies: No Known Allergies  Past Medical History, Surgical history, Social history, and Family History were reviewed and updated.  Review of Systems: All other 10 point review of systems is negative.   Physical Exam:  height is 5\' 6"  (1.676 m) and weight is 198 lb (89.812 kg). Her oral temperature is 98 F (36.7  C). Her blood pressure is 112/57 and her pulse is 73. Her respiration is 16.   Wt Readings from Last 3 Encounters:  11/24/14 198 lb (89.812 kg)  11/10/14 201 lb 2 oz (91.23 kg)  10/13/14 197 lb (89.359 kg)    Ocular: Sclerae unicteric, pupils equal, round and reactive to light Ear-nose-throat: Oropharynx clear, dentition fair Lymphatic: No cervical or supraclavicular adenopathy Lungs no rales or rhonchi, good excursion bilaterally Heart regular rate and rhythm, no murmur appreciated Abd soft, nontender, positive bowel sounds MSK no focal spinal tenderness, no joint edema Neuro: non-focal, well-oriented, appropriate affect Breasts: Deferred   Lab Results  Component Value Date   WBC 3.4* 11/10/2014   HGB 11.7 11/10/2014   HCT 35.1 11/10/2014   MCV 98 11/10/2014   PLT 162 11/10/2014   Lab Results  Component Value Date   FERRITIN 189 11/10/2014   IRON 67 11/10/2014   TIBC 286 11/10/2014   UIBC 220 11/10/2014   IRONPCTSAT 23 11/10/2014   Lab Results  Component Value Date   RBC 3.58* 11/10/2014   No results found for: KPAFRELGTCHN, LAMBDASER, KAPLAMBRATIO No results found for: IGGSERUM, IGA, IGMSERUM No results found for: TOTALPROTELP, ALBUMINELP, A1GS, A2GS, BETS, BETA2SER, GAMS, MSPIKE, SPEI   Chemistry      Component Value Date/Time  NA 138 11/10/2014 1415   NA 134 06/14/2013 1526   K 4.3 11/10/2014 1415   K 3.7 06/14/2013 1526   CL 105 11/10/2014 1415   CL 105 06/14/2013 1526   CO2 26 11/10/2014 1415   CO2 26 06/14/2013 1526   BUN 17 11/10/2014 1415   BUN 19 06/14/2013 1526   CREATININE 0.71 11/10/2014 1415   CREATININE 0.9 06/14/2013 1526      Component Value Date/Time   CALCIUM 8.8 11/10/2014 1415   CALCIUM 8.9 06/14/2013 1526   ALKPHOS 47 11/10/2014 1415   ALKPHOS 45 06/14/2013 1526   AST 20 11/10/2014 1415   AST 19 06/14/2013 1526   ALT 16 11/10/2014 1415   ALT 12 06/14/2013 1526   BILITOT 0.3 11/10/2014 1415   BILITOT 0.50 06/14/2013 1526      Impression and Plan: Ms. Ciccarelli is 44 year old Hispanic female with chronic phase CML. She is on Sprycel and seems to be responding nicely to it. She appears to be in major molecular remission. She is still feeling very fatigued. She is under a lot of stress at work.  Her Hgb is 11.7 MCV 98. Her iron saturation is 23% and ferritin was 189. We may need to give her iron. I will also add a vitamin D and B 12 to her labs if possible to see if she is deficient in either if these.  We will see her back in 6 months for labs and follow-up.  She knows to call here with any questions or concerns and to go to the ED in the event of an emergency. We can certainly see her sooner if need be.   Eliezer Bottom, NP 4/11/20163:56 PM

## 2014-11-26 ENCOUNTER — Telehealth: Payer: Self-pay | Admitting: Hematology & Oncology

## 2014-11-26 ENCOUNTER — Other Ambulatory Visit: Payer: Self-pay | Admitting: Family

## 2014-11-26 DIAGNOSIS — C9210 Chronic myeloid leukemia, BCR/ABL-positive, not having achieved remission: Secondary | ICD-10-CM

## 2014-11-26 MED ORDER — SODIUM CHLORIDE 0.9 % IV SOLN: 510.0000 mg | Freq: Once | INTRAVENOUS | Status: DC

## 2014-11-26 NOTE — Telephone Encounter (Signed)
Left pt message to call for infusion appointment

## 2014-11-26 NOTE — Addendum Note (Signed)
Addended by: Gerrit Halls T on: 11/26/2014 09:01 AM   Modules accepted: Orders

## 2014-12-03 ENCOUNTER — Ambulatory Visit (HOSPITAL_BASED_OUTPATIENT_CLINIC_OR_DEPARTMENT_OTHER): Payer: BC Managed Care – PPO

## 2014-12-03 DIAGNOSIS — C9210 Chronic myeloid leukemia, BCR/ABL-positive, not having achieved remission: Secondary | ICD-10-CM

## 2014-12-03 DIAGNOSIS — C9290 Myeloid leukemia, unspecified, not having achieved remission: Secondary | ICD-10-CM | POA: Diagnosis not present

## 2014-12-03 MED ORDER — SODIUM CHLORIDE 0.9 % IV SOLN
510.0000 mg | Freq: Once | INTRAVENOUS | Status: AC
  Administered 2014-12-03: 510 mg via INTRAVENOUS
  Filled 2014-12-03: qty 17

## 2014-12-03 NOTE — Patient Instructions (Signed)

## 2014-12-26 ENCOUNTER — Other Ambulatory Visit: Payer: Self-pay | Admitting: Hematology & Oncology

## 2015-01-02 ENCOUNTER — Telehealth: Payer: Self-pay | Admitting: Internal Medicine

## 2015-01-02 NOTE — Telephone Encounter (Signed)
RX FOR ADDERALL 10 MG 10-10-2014 NOT PICK UP SHREDDED

## 2015-01-09 ENCOUNTER — Other Ambulatory Visit: Payer: Self-pay

## 2015-01-13 ENCOUNTER — Ambulatory Visit (INDEPENDENT_AMBULATORY_CARE_PROVIDER_SITE_OTHER): Payer: BC Managed Care – PPO | Admitting: Internal Medicine

## 2015-01-13 VITALS — BP 118/64 | HR 74 | Temp 98.0°F | Ht 66.0 in | Wt 194.4 lb

## 2015-01-13 DIAGNOSIS — F988 Other specified behavioral and emotional disorders with onset usually occurring in childhood and adolescence: Secondary | ICD-10-CM

## 2015-01-13 DIAGNOSIS — F909 Attention-deficit hyperactivity disorder, unspecified type: Secondary | ICD-10-CM | POA: Diagnosis not present

## 2015-01-13 DIAGNOSIS — F418 Other specified anxiety disorders: Secondary | ICD-10-CM

## 2015-01-13 DIAGNOSIS — F329 Major depressive disorder, single episode, unspecified: Secondary | ICD-10-CM

## 2015-01-13 DIAGNOSIS — F419 Anxiety disorder, unspecified: Principal | ICD-10-CM

## 2015-01-13 MED ORDER — FLUOXETINE HCL 20 MG PO TABS
40.0000 mg | ORAL_TABLET | Freq: Every day | ORAL | Status: DC
Start: 2015-01-13 — End: 2015-03-12

## 2015-01-13 MED ORDER — AMPHETAMINE-DEXTROAMPHETAMINE 10 MG PO TABS
30.0000 mg | ORAL_TABLET | Freq: Every day | ORAL | Status: DC
Start: 2015-01-13 — End: 2015-09-30

## 2015-01-13 NOTE — Assessment & Plan Note (Addendum)
Symptoms well-controlled but sometimes gets difficulty with sleeping after the second dose of Adderall. Other options discussed including Vyvanse and adderall  extended release Plan: Change from 2 tablets in the morning and 1 or 2 later on the day to 3 tablets in the morning  Today , I spent more than  15  min with the patient: >50% of the time counseling regards treatment options for ADD

## 2015-01-13 NOTE — Progress Notes (Signed)
Pre visit review using our clinic review tool, if applicable. No additional management support is needed unless otherwise documented below in the visit note. 

## 2015-01-13 NOTE — Assessment & Plan Note (Addendum)
Currently well controlled, from time to time gets emotional but for the most part she is doing great. Recommend to continue taking the medications, refill sent, I also recommend not to overlook  alternative therapies such as yoga, meditation or mindfulness

## 2015-01-13 NOTE — Progress Notes (Signed)
   Subjective:    Patient ID: [name], female    DOB: 1970/11/17, 44 y.o.   MRN: [id number]  DOS:  01/13/2015 Type of visit - description : Routine visit Interval history: Good compliance with medication, anxiety and depression well-controlled most of the time, from time to time she gets emotional. ADD, on adderall, good response, when she takes a second dose even early during the day she gets problems sleeping at night   Review of Systems   Past Medical History  Diagnosis Date  . CML (chronic myelocytic leukemia) 2013  . 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 Glaucoma suspect   . Anxiety and depression 04/02/2013    Past Surgical History  Procedure Laterality Date  . Cesarean section  2006 & 2012  . Tubal ligation      History   Social History  . Marital Status: Married    Spouse Name: Bradly Chris  . Number of Children: 2  . Years of Education: N/A   Occupational History  . [employer]   Social History Main Topics  . Smoking status: Never Smoker   . Smokeless tobacco: Never Used     Comment: never used tobacco  . Alcohol Use: No  . Drug Use: No  . Sexual Activity:    Partners: Male   Other Topics Concern  . Not on file   Social History Narrative   From [place]    2 children  --- 2006, 2012              Medication List       This list is accurate as of: 01/13/15  6:50 PM.  Always use your most recent med list.               amphetamine-dextroamphetamine 10 MG tablet  Commonly known as:  ADDERALL  Take 3 tablets (30 mg total) by mouth daily with breakfast.     FLUoxetine 20 MG tablet  Commonly known as:  PROZAC  Take 2 tablets (40 mg total) by mouth daily.     multivitamin with minerals Tabs tablet  Take 1 tablet by mouth daily.     SPRYCEL 70 MG tablet  Generic drug:  dasatinib  TAKE 1 TABLET BY MOUTH DAILY.           Objective:   Physical Exam BP 118/64 mmHg  Pulse 74  Temp(Src) 98 F  (36.7 C) (Oral)  Ht 5\' 6"  (1.676 m)  Wt 194 lb 6 oz (88.168 kg)  BMI 31.39 kg/m2  SpO2 98%  General:   Well developed, well nourished . NAD.  HEENT:  Normocephalic . Face symmetric, atraumatic  Neurologic:  alert & oriented X3.  Speech normal, gait appropriate for age and unassisted Psych--  Cognition and judgment appear intact.  Cooperative with normal attention span and concentration.  Behavior appropriate. No anxious or depressed appearing.       Assessment & Plan:

## 2015-01-19 ENCOUNTER — Ambulatory Visit: Payer: BC Managed Care – PPO | Admitting: Obstetrics

## 2015-01-19 ENCOUNTER — Telehealth: Payer: Self-pay | Admitting: *Deleted

## 2015-01-19 NOTE — Telephone Encounter (Signed)
Patient canceled her consult appointment today because her cycles have restarted and she is doing better.

## 2015-01-23 ENCOUNTER — Telehealth: Payer: Self-pay

## 2015-01-23 NOTE — Telephone Encounter (Signed)
UDS: 01/13/2015  Positive for Adderall Positive for Fluoxetine   Low risk per Dr. Larose Kells 01/22/2015

## 2015-03-12 ENCOUNTER — Other Ambulatory Visit: Payer: Self-pay | Admitting: Internal Medicine

## 2015-04-06 ENCOUNTER — Telehealth: Payer: Self-pay | Admitting: Behavioral Health

## 2015-04-06 NOTE — Telephone Encounter (Signed)
Unable to reach patient at time of Pre-Visit Call.  Left message for patient to return call when available.    

## 2015-04-07 ENCOUNTER — Ambulatory Visit (INDEPENDENT_AMBULATORY_CARE_PROVIDER_SITE_OTHER): Payer: BC Managed Care – PPO | Admitting: Internal Medicine

## 2015-04-07 ENCOUNTER — Encounter: Payer: Self-pay | Admitting: Internal Medicine

## 2015-04-07 VITALS — BP 122/78 | HR 65 | Temp 98.1°F | Ht 66.0 in | Wt 199.2 lb

## 2015-04-07 DIAGNOSIS — F32A Depression, unspecified: Secondary | ICD-10-CM

## 2015-04-07 DIAGNOSIS — F329 Major depressive disorder, single episode, unspecified: Secondary | ICD-10-CM

## 2015-04-07 DIAGNOSIS — Z114 Encounter for screening for human immunodeficiency virus [HIV]: Secondary | ICD-10-CM

## 2015-04-07 DIAGNOSIS — F988 Other specified behavioral and emotional disorders with onset usually occurring in childhood and adolescence: Secondary | ICD-10-CM

## 2015-04-07 DIAGNOSIS — Z0000 Encounter for general adult medical examination without abnormal findings: Secondary | ICD-10-CM | POA: Diagnosis not present

## 2015-04-07 DIAGNOSIS — F419 Anxiety disorder, unspecified: Secondary | ICD-10-CM

## 2015-04-07 NOTE — Progress Notes (Signed)
Subjective:    Patient ID: [name], female    DOB: 05-04-1971, 44 y.o.   MRN: [id number]  DOS:  04/07/2015 Type of visit - description : Complete physical exam  Interval history:  CPX, also discussed her  chronic medical problems, also has multiple symptoms, see review of systems  Review of Systems  Constitutional: No fever. No chills. Has gained some weight, she remains sedentary. No unusual sweats  HEENT: No dental problems, no ear discharge, no facial swelling, no voice changes. No eye discharge, no eye  redness , no  intolerance to light   Respiratory: No wheezing , no  difficulty breathing. No cough , no mucus production  Cardiovascular: No CP, no leg swelling , no  Palpitations  GI: no nausea, no vomiting, no diarrhea , no  abdominal pain.  No blood in the stools. No dysphagia, no odynophagia    Endocrine: No polyphagia, no polyuria , ? polydipsia. Denies any blurred vision  GU: No dysuria, gross hematuria, difficulty urinating. Occasionally has urinary urgency, no frequency.  Musculoskeletal:  Reports numbness at the first second and third fingers, more noticeable on the right. Symptoms are day or night, more noticeable when she keeps her arm in the same position. No neck pain, wrist or elbow pain.  Skin: No change in the color of the skin, palor , no  Rash  Allergic, immunologic: No environmental allergies , no  food allergies  Neurological: No dizziness no  syncope. No headaches. No diplopia, no slurred, no slurred speech, no motor deficits, no facial  Numbness Sometimes she has discomfort at the lower extremities at night, cramps? Sometimes feels like her legs move spontaneously. Symptoms are going on for a while, less noticeable during an  adderall holiday (like in the summer).Symptoms are really vague, wonders if she has RLS   Hematological: No enlarged lymph nodes, no easy bruising , no unusual bleedings  Psychiatry: No suicidal ideas, no hallucinations,  no beavior problems, no confusion.  No unusual/severe anxiety, no depression    Past Medical History  Diagnosis Date  . CML (chronic myelocytic leukemia) 2013  . 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 Glaucoma suspect   . Anxiety and depression 04/02/2013    Past Surgical History  Procedure Laterality Date  . Cesarean section  2006 & 2012  . Tubal ligation      Social History   Social History  . Marital Status: Married    Spouse Name: Bradly Chris  . Number of Children: 2  . Years of Education: N/A   Occupational History  . [employer]   Social History Main Topics  . Smoking status: Never Smoker   . Smokeless tobacco: Never Used     Comment: never used tobacco  . Alcohol Use: No  . Drug Use: No  . Sexual Activity:    Partners: Male   Other Topics Concern  . Not on file   Social History Narrative   From [place]    2 children  --- 2006, 2012           Family History  Problem Relation Age of Onset  . Hypertension Maternal Grandmother   . Cancer Maternal Grandfather   . Heart disease Paternal Grandmother   . Stroke Paternal Grandfather   . Diabetes Neg Hx   . Colon cancer Neg Hx   . Breast cancer Neg Hx       Medication List  This list is accurate as of: 04/07/15  5:52 PM.  Always use your most recent med list.               amphetamine-dextroamphetamine 10 MG tablet  Commonly known as:  ADDERALL  Take 3 tablets (30 mg total) by mouth daily with breakfast.     FLUoxetine 20 MG tablet  Commonly known as:  PROZAC  TAKE 2 TABLETS BY MOUTH EVERY DAY     multivitamin with minerals Tabs tablet  Take 1 tablet by mouth daily.     SPRYCEL 70 MG tablet  Generic drug:  dasatinib  TAKE 1 TABLET BY MOUTH DAILY.           Objective:   Physical Exam BP 122/78 mmHg  Pulse 65  Temp(Src) 98.1 F (36.7 C) (Oral)  Ht 5\' 6"  (1.676 m)  Wt 199 lb 4 oz (90.379 kg)  BMI 32.18 kg/m2  SpO2 99%  General:   Well  developed, well nourished . NAD.  Neck:  No  thyromegaly , normal carotid pulse HEENT:  Normocephalic . Face symmetric, atraumatic Lungs:  CTA B Normal respiratory effort, no intercostal retractions, no accessory muscle use. Heart: RRR,  no murmur.  No pretibial edema bilaterally  Abdomen:  Not distended, soft, non-tender. No rebound or rigidity. No mass,organomegaly Skin: Exposed areas without rash. Not pale. Not jaundice Neurologic:  alert & oriented X3.  Speech normal, gait appropriate for age and unassisted Strength symmetric and appropriate for age. No tremors. DTRs symmetric. Psych: Cognition and judgment appear intact.  Cooperative with normal attention span and concentration.  Behavior appropriate. Slightly apprehensive and anxious , no  depressed appearing.    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

## 2015-04-07 NOTE — Assessment & Plan Note (Addendum)
She is slightly apprehensive and anxious even with medication. Recommend to continue with SSRIs ; already in couples counseling, individual counseling?Marland Kitchen On further conversation, she become tearful, she expressed her concerns, again recommend her to continue discussing her concerns with the counselors, I believe that at this point counseling will help more than increase her medications.

## 2015-04-07 NOTE — Assessment & Plan Note (Addendum)
Td 2012 per pt Female care per gyn, Dr Jodi Mourning  [place] Ao [phone] neg for AAA  Diet-exercise discussed  Labs: Cholesterol, TSH and HIV

## 2015-04-07 NOTE — Progress Notes (Signed)
Pre visit review using our clinic review tool, if applicable. No additional management support is needed unless otherwise documented below in the visit note. 

## 2015-04-07 NOTE — Assessment & Plan Note (Addendum)
Symptoms controlled on adderall l. Interestingly she reports nocturnal leg discomfort and sometimes involuntary movements. She may have RLS type of symptoms exacerbated by adderall  or side effects from Adderall. For now recommend observation.  Ok to try a lower adderall dose (20 mg?)

## 2015-04-07 NOTE — Patient Instructions (Signed)
Get your blood work before you leave   Get a carpal tunnel splinter OTC, use it at night on the right hand  Continue with the same medications

## 2015-04-08 LAB — HIV ANTIBODY (ROUTINE TESTING W REFLEX): HIV: NONREACTIVE

## 2015-04-08 LAB — LIPID PANEL
Cholesterol: 223 mg/dL — ABNORMAL HIGH (ref 0–200)
HDL: 79.3 mg/dL (ref >39.00)
LDL Cholesterol: 132 mg/dL — ABNORMAL HIGH (ref 0–99)
NONHDL: 144.18
Total CHOL/HDL Ratio: 3
Triglycerides: 60 mg/dL (ref 0.0–149.0)
VLDL: 12 mg/dL (ref 0.0–40.0)

## 2015-04-08 LAB — TSH: TSH: 3.02 u[IU]/mL (ref 0.35–4.50)

## 2015-05-25 ENCOUNTER — Other Ambulatory Visit (HOSPITAL_BASED_OUTPATIENT_CLINIC_OR_DEPARTMENT_OTHER): Payer: BC Managed Care – PPO

## 2015-05-25 ENCOUNTER — Encounter: Payer: Self-pay | Admitting: Hematology & Oncology

## 2015-05-25 ENCOUNTER — Ambulatory Visit (HOSPITAL_BASED_OUTPATIENT_CLINIC_OR_DEPARTMENT_OTHER): Payer: BC Managed Care – PPO

## 2015-05-25 ENCOUNTER — Ambulatory Visit (HOSPITAL_BASED_OUTPATIENT_CLINIC_OR_DEPARTMENT_OTHER): Payer: BC Managed Care – PPO | Admitting: Hematology & Oncology

## 2015-05-25 VITALS — BP 121/74 | HR 75 | Temp 98.2°F | Resp 14 | Ht 66.0 in | Wt 200.0 lb

## 2015-05-25 DIAGNOSIS — C9211 Chronic myeloid leukemia, BCR/ABL-positive, in remission: Secondary | ICD-10-CM | POA: Diagnosis not present

## 2015-05-25 DIAGNOSIS — Z23 Encounter for immunization: Secondary | ICD-10-CM | POA: Diagnosis not present

## 2015-05-25 DIAGNOSIS — C9210 Chronic myeloid leukemia, BCR/ABL-positive, not having achieved remission: Secondary | ICD-10-CM

## 2015-05-25 DIAGNOSIS — R5383 Other fatigue: Secondary | ICD-10-CM

## 2015-05-25 LAB — COMPREHENSIVE METABOLIC PANEL
ALK PHOS: 55 U/L (ref 33–115)
ALT: 21 U/L (ref 6–29)
AST: 21 U/L (ref 10–35)
Albumin: 4.3 g/dL (ref 3.6–5.1)
BUN: 13 mg/dL (ref 7–25)
CALCIUM: 9.4 mg/dL (ref 8.6–10.2)
CO2: 29 mmol/L (ref 20–31)
CREATININE: 0.8 mg/dL (ref 0.50–1.10)
Chloride: 102 mmol/L (ref 98–110)
GLUCOSE: 113 mg/dL — AB (ref 65–99)
Potassium: 4.5 mmol/L (ref 3.5–5.3)
Sodium: 138 mmol/L (ref 135–146)
Total Bilirubin: 0.4 mg/dL (ref 0.2–1.2)
Total Protein: 6.5 g/dL (ref 6.1–8.1)

## 2015-05-25 LAB — CBC WITH DIFFERENTIAL (CANCER CENTER ONLY)
BASO#: 0 10*3/uL (ref 0.0–0.2)
BASO%: 0.3 % (ref 0.0–2.0)
EOS%: 1.1 % (ref 0.0–7.0)
Eosinophils Absolute: 0 10*3/uL (ref 0.0–0.5)
HCT: 36.9 % (ref 34.8–46.6)
HGB: 12.2 g/dL (ref 11.6–15.9)
LYMPH#: 1.2 10*3/uL (ref 0.9–3.3)
LYMPH%: 32.4 % (ref 14.0–48.0)
MCH: 31.8 pg (ref 26.0–34.0)
MCHC: 33.1 g/dL (ref 32.0–36.0)
MCV: 96 fL (ref 81–101)
MONO#: 0.2 10*3/uL (ref 0.1–0.9)
MONO%: 5 % (ref 0.0–13.0)
NEUT#: 2.2 10*3/uL (ref 1.5–6.5)
NEUT%: 61.2 % (ref 39.6–80.0)
Platelets: 167 10*3/uL (ref 145–400)
RBC: 3.84 10*6/uL (ref 3.70–5.32)
RDW: 12.4 % (ref 11.1–15.7)
WBC: 3.6 10*3/uL — AB (ref 3.9–10.0)

## 2015-05-25 LAB — LACTATE DEHYDROGENASE: LDH: 89 U/L — AB (ref 94–250)

## 2015-05-25 MED ORDER — INFLUENZA VAC SPLIT QUAD 0.5 ML IM SUSY
0.5000 mL | PREFILLED_SYRINGE | Freq: Once | INTRAMUSCULAR | Status: AC
  Administered 2015-05-25: 0.5 mL via INTRAMUSCULAR
  Filled 2015-05-25: qty 0.5

## 2015-05-25 NOTE — Patient Instructions (Signed)

## 2015-05-25 NOTE — Progress Notes (Signed)
Hematology and Oncology Follow Up Visit  [name] [id number] [birth year] 44 y.o. 05/25/2015   Principle Diagnosis:   Chronic phase CML-molecular remission  Current Therapy:    Sprycel 70 mg by mouth daily - to stop today     Interim History:  [name] is back for followup she has had a good summer. We had seen her every 6 months.  She has been in remission now for over 2 years. She's done incredibly well.  I talked to her about stopping the Sprycel. I think she is a great candidate for [place].  His heart assayed whether or not she is having problems are not with the fatigue.  She is very happy that she is stop the Sprycel. I told her that we would follow her for every 3 months for the first year.  She is now working at [employer]. She is enjoying this.  She's had no monthly cycle. It sounds like she is going through the change of life. I told her that the Sprycel should not have done this.  Overall, her performance status is ECOG 0.   Medications:  Current outpatient prescriptions:  .  amphetamine-dextroamphetamine (ADDERALL) 10 MG tablet, Take 3 tablets (30 mg total) by mouth daily with breakfast., Disp: 90 tablet, Rfl: 0 .  FLUoxetine (PROZAC) 20 MG tablet, TAKE 2 TABLETS BY MOUTH EVERY DAY, Disp: 60 tablet, Rfl: 0 .  Multiple Vitamin (MULTIVITAMIN WITH MINERALS) TABS tablet, Take 1 tablet by mouth daily., Disp: , Rfl:  .  SPRYCEL 70 MG tablet, TAKE 1 TABLET BY MOUTH DAILY., Disp: 90 tablet, Rfl: 1  Current facility-administered medications:  .  Influenza vac split quadrivalent PF (FLUARIX) injection 0.5 mL, 0.5 mL, Intramuscular, Once, Volanda Napoleon, MD  Allergies: No Known Allergies  Past Medical History, Surgical history, Social history, and Family History were reviewed and updated.  Review of Systems: As above  Physical Exam:  height is 5\' 6"  (1.676 m) and weight is 200 lb (90.719 kg). Her oral temperature is 98.2 F (36.8 C). Her blood  pressure is 121/74 and her pulse is 75. Her respiration is 14.   Well-developed well-nourished Hispanic female. Head and neck exam shows no adenopathy in the neck. There is no ocular or oral lesions. Lungs are clear. Cardiac exam regular rate and rhythm with no murmurs rubs or bruits. Abdomen is soft. Has good bowel sounds. There is no fluid wave. There is no palpable liver edge. She has no palpable splenomegaly. Back exam shows no tenderness over the spine ribs or hips. Extremities shows no clubbing cyanosis or edema. Skin exam no rashes ecchymoses or petechia. Neurological exam is nonfocal.  Lab Results  Component Value Date   WBC 3.6* 05/25/2015   HGB 12.2 05/25/2015   HCT 36.9 05/25/2015   MCV 96 05/25/2015   PLT 167 05/25/2015     Chemistry      Component Value Date/Time   NA 138 11/10/2014 1415   NA 134 06/14/2013 1526   K 4.3 11/10/2014 1415   K 3.7 06/14/2013 1526   CL 105 11/10/2014 1415   CL 105 06/14/2013 1526   CO2 26 11/10/2014 1415   CO2 26 06/14/2013 1526   BUN 17 11/10/2014 1415   BUN 19 06/14/2013 1526   CREATININE 0.71 11/10/2014 1415   CREATININE 0.9 06/14/2013 1526      Component Value Date/Time   CALCIUM 8.8 11/10/2014 1415   CALCIUM 8.9 06/14/2013 1526   ALKPHOS  47 11/10/2014 1415   ALKPHOS 45 06/14/2013 1526   AST 20 11/10/2014 1415   AST 19 06/14/2013 1526   ALT 16 11/10/2014 1415   ALT 12 06/14/2013 1526   BILITOT 0.3 11/10/2014 1415   BILITOT 0.50 06/14/2013 1526         Impression and Plan: [name] is 44 year old Hispanic female. She has chronic phase CML. She is on Sprycel. She's done very well with Sprycel.  I told her that she would be safe to come off the Sprycel. I told her that if the CML did recur, that 95% of patients who go back onto Sprycel or other TKI therapy go back into remission. This is very reassuring to her.  We will go ahead and get her back every 3 months for the first year. We with anger back every 4 months the second  year.  I spent a good 30 minutes with her talking to her about the change in therapy and why I think it would be a good idea to have her stop the Sprycel.   Volanda Napoleon, MD 10/10/20164:06 PM

## 2015-05-26 LAB — FERRITIN CHCC: Ferritin: 337 ng/ml — ABNORMAL HIGH (ref 9–269)

## 2015-05-26 LAB — VITAMIN B12: Vitamin B-12: 379 pg/mL (ref 211–911)

## 2015-05-26 LAB — IRON AND TIBC CHCC
%SAT: 22 % (ref 21–57)
Iron: 60 ug/dL (ref 41–142)
TIBC: 271 ug/dL (ref 236–444)
UIBC: 211 ug/dL (ref 120–384)

## 2015-05-26 LAB — VITAMIN D 25 HYDROXY (VIT D DEFICIENCY, FRACTURES): VIT D 25 HYDROXY: 32 ng/mL (ref 30–100)

## 2015-08-24 ENCOUNTER — Other Ambulatory Visit: Payer: BC Managed Care – PPO

## 2015-08-24 ENCOUNTER — Ambulatory Visit: Payer: BC Managed Care – PPO | Admitting: Hematology & Oncology

## 2015-09-30 ENCOUNTER — Encounter: Payer: Self-pay | Admitting: Hematology & Oncology

## 2015-09-30 ENCOUNTER — Other Ambulatory Visit (HOSPITAL_BASED_OUTPATIENT_CLINIC_OR_DEPARTMENT_OTHER): Payer: BLUE CROSS/BLUE SHIELD

## 2015-09-30 ENCOUNTER — Ambulatory Visit (HOSPITAL_BASED_OUTPATIENT_CLINIC_OR_DEPARTMENT_OTHER): Payer: BLUE CROSS/BLUE SHIELD | Admitting: Hematology & Oncology

## 2015-09-30 VITALS — BP 110/71 | HR 64 | Temp 97.7°F | Resp 16 | Ht 66.0 in | Wt 209.0 lb

## 2015-09-30 DIAGNOSIS — C9210 Chronic myeloid leukemia, BCR/ABL-positive, not having achieved remission: Secondary | ICD-10-CM

## 2015-09-30 DIAGNOSIS — D509 Iron deficiency anemia, unspecified: Secondary | ICD-10-CM

## 2015-09-30 DIAGNOSIS — C9211 Chronic myeloid leukemia, BCR/ABL-positive, in remission: Secondary | ICD-10-CM

## 2015-09-30 DIAGNOSIS — Z23 Encounter for immunization: Secondary | ICD-10-CM

## 2015-09-30 LAB — CBC WITH DIFFERENTIAL (CANCER CENTER ONLY)
BASO#: 0 10*3/uL (ref 0.0–0.2)
BASO%: 0.7 % (ref 0.0–2.0)
EOS%: 1.3 % (ref 0.0–7.0)
Eosinophils Absolute: 0.1 10*3/uL (ref 0.0–0.5)
HEMATOCRIT: 37.6 % (ref 34.8–46.6)
HEMOGLOBIN: 13 g/dL (ref 11.6–15.9)
LYMPH#: 1.5 10*3/uL (ref 0.9–3.3)
LYMPH%: 33 % (ref 14.0–48.0)
MCH: 31.3 pg (ref 26.0–34.0)
MCHC: 34.6 g/dL (ref 32.0–36.0)
MCV: 91 fL (ref 81–101)
MONO#: 0.4 10*3/uL (ref 0.1–0.9)
MONO%: 8.1 % (ref 0.0–13.0)
NEUT%: 56.9 % (ref 39.6–80.0)
NEUTROS ABS: 2.6 10*3/uL (ref 1.5–6.5)
Platelets: 161 10*3/uL (ref 145–400)
RBC: 4.15 10*6/uL (ref 3.70–5.32)
RDW: 12.2 % (ref 11.1–15.7)
WBC: 4.6 10*3/uL (ref 3.9–10.0)

## 2015-09-30 LAB — COMPREHENSIVE METABOLIC PANEL (CC13)
ALT: 14 IU/L (ref 0–32)
AST (SGOT): 20 IU/L (ref 0–40)
Albumin, Serum: 4.3 g/dL (ref 3.5–5.5)
Albumin/Globulin Ratio: 1.6 (ref 1.1–2.5)
Alkaline Phosphatase, S: 71 IU/L (ref 39–117)
BUN/Creatinine Ratio: 24 — ABNORMAL HIGH (ref 9–23)
BUN: 19 mg/dL (ref 6–24)
Bilirubin Total: 0.3 mg/dL (ref 0.0–1.2)
CALCIUM: 9.4 mg/dL (ref 8.7–10.2)
Carbon Dioxide, Total: 26 mmol/L (ref 18–29)
Chloride, Ser: 101 mmol/L (ref 96–106)
Creatinine, Ser: 0.78 mg/dL (ref 0.57–1.00)
GFR calc Af Amer: 105 mL/min/{1.73_m2} (ref >59)
GFR, EST NON AFRICAN AMERICAN: 91 mL/min/{1.73_m2} (ref >59)
GLOBULIN, TOTAL: 2.7 g/dL (ref 1.5–4.5)
GLUCOSE: 90 mg/dL (ref 65–99)
Potassium, Ser: 3.5 mmol/L (ref 3.5–5.2)
SODIUM: 135 mmol/L (ref 134–144)
Total Protein: 7 g/dL (ref 6.0–8.5)

## 2015-09-30 NOTE — Progress Notes (Addendum)
Hematology and Oncology Follow Up Visit  [name] [id number] 1971-03-09 45 y.o. 09/30/2015   Principle Diagnosis:   Chronic phase CML-molecular remission  Current Therapy:    Observation  - off Sprycel     Interim History:  [name] is back for followup.  She is now off Sprycel. We stopped the Sprycel back in October. I thought that we could see how she did off the Sprycel. She feels okay. 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 She is a little bit more tired. Unfortunately, we cannot add any iron studies to lab work that she had done.  She's had no issues with bleeding. She's had no fever. She's had no cough. She's had no nausea or vomiting. She's had no leg swelling. She's had no rashes.   Overall, her performance status is ECOG 0.   Medications:  Current outpatient prescriptions:  .  FLUoxetine (PROZAC) 20 MG tablet, TAKE 2 TABLETS BY MOUTH EVERY DAY, Disp: 60 tablet, Rfl: 0 .  Multiple Vitamin (MULTIVITAMIN WITH MINERALS) TABS tablet, Take 1 tablet by mouth daily., Disp: , Rfl:   Allergies: No Known Allergies  Past Medical History, Surgical history, Social history, and Family History were reviewed and updated.  Review of Systems: As above  Physical Exam:  height is 5\' 6"  (1.676 m) and weight is 209 lb (94.802 kg). Her oral temperature is 97.7 F (36.5 C). Her blood pressure is 110/71 and her pulse is 64. Her respiration is 16.   Well-developed well-nourished Hispanic female. Head and neck exam shows no adenopathy in the neck. There is no ocular or oral lesions. Lungs are clear. Cardiac exam regular rate and rhythm with no murmurs rubs or bruits. Abdomen is soft. Has good bowel sounds. There is no fluid wave. There is no palpable liver edge. She has no palpable splenomegaly. Back exam shows no tenderness over the spine ribs or hips. Extremities shows no clubbing cyanosis or edema. Skin exam no rashes ecchymoses or petechia. Neurological exam is  nonfocal.  Lab Results  Component Value Date   WBC 4.6 09/30/2015   HGB 13.0 09/30/2015   HCT 37.6 09/30/2015   MCV 91 09/30/2015   PLT 161 09/30/2015     Chemistry      Component Value Date/Time   NA 138 05/25/2015 1506   NA 134 06/14/2013 1526   K 4.5 05/25/2015 1506   K 3.7 06/14/2013 1526   CL 102 05/25/2015 1506   CL 105 06/14/2013 1526   CO2 29 05/25/2015 1506   CO2 26 06/14/2013 1526   BUN 13 05/25/2015 1506   BUN 19 06/14/2013 1526   CREATININE 0.80 05/25/2015 1506   CREATININE 0.9 06/14/2013 1526      Component Value Date/Time   CALCIUM 9.4 05/25/2015 1506   CALCIUM 8.9 06/14/2013 1526   ALKPHOS 55 05/25/2015 1506   ALKPHOS 45 06/14/2013 1526   AST 21 05/25/2015 1506   AST 19 06/14/2013 1526   ALT 21 05/25/2015 1506   ALT 12 06/14/2013 1526   BILITOT 0.4 05/25/2015 1506   BILITOT 0.50 06/14/2013 1526         Impression and Plan: [name] is 45 year old Hispanic female. She has chronic phase CML. She got into remission quickly with Sprycel. She has not had a detectable BCR / ABL ratio. As such, I thought we could get her off the Sprycel.  We will see how her BCR/ABL ratio is. I looked at her  blood on the microscope and did not see anything that looked unusual.  Hopefully, she will be able to stay in molecular remission.  She and her family are planning to head back to [place] for 6 weeks in the summertime. I think they go to [place].  I would like to see her back in 3 months.  Volanda Napoleon, MD 2/15/20173:26 PM    ADDENDUM:  The bcr/abl ratio is now 14.45% .   This indicates that the CML is relapsing.  As such, i will re-start the Sprycel at 70mg  po q day.  I called her on the phone to let her know.  She understands,.  We will keep her appt for may as already scheduled.  [name]

## 2015-10-09 ENCOUNTER — Encounter: Payer: Self-pay | Admitting: Internal Medicine

## 2015-10-09 ENCOUNTER — Encounter: Payer: Self-pay | Admitting: Hematology & Oncology

## 2015-10-09 ENCOUNTER — Ambulatory Visit (INDEPENDENT_AMBULATORY_CARE_PROVIDER_SITE_OTHER): Payer: BLUE CROSS/BLUE SHIELD | Admitting: Internal Medicine

## 2015-10-09 VITALS — BP 120/78 | HR 75 | Temp 97.8°F | Ht 66.0 in | Wt 210.0 lb

## 2015-10-09 DIAGNOSIS — Z09 Encounter for follow-up examination after completed treatment for conditions other than malignant neoplasm: Secondary | ICD-10-CM

## 2015-10-09 DIAGNOSIS — F909 Attention-deficit hyperactivity disorder, unspecified type: Secondary | ICD-10-CM

## 2015-10-09 DIAGNOSIS — F411 Generalized anxiety disorder: Secondary | ICD-10-CM | POA: Diagnosis not present

## 2015-10-09 DIAGNOSIS — F988 Other specified behavioral and emotional disorders with onset usually occurring in childhood and adolescence: Secondary | ICD-10-CM

## 2015-10-09 DIAGNOSIS — Z23 Encounter for immunization: Secondary | ICD-10-CM

## 2015-10-09 NOTE — Progress Notes (Signed)
Pre visit review using our clinic review tool, if applicable. No additional management support is needed unless otherwise documented below in the visit note. 

## 2015-10-09 NOTE — Progress Notes (Signed)
   Subjective:    Patient ID: [name], female    DOB: February 28, 1971, 45 y.o.   MRN: [id number]  DOS:  10/09/2015 Type of visit - description : Routine office visit Interval history:  Anxiety, depression: Good compliance with medication, symptoms relatively well control, still sees a couples Social worker. ADD, ran out off medication several months ago, currently doing okay without meds.  Review of Systems   Past Medical History  Diagnosis Date  . CML (chronic myelocytic leukemia) ([place]) 2013  . 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 Glaucoma suspect   . Anxiety and depression 04/02/2013    Past Surgical History  Procedure Laterality Date  . Cesarean section  2006 & 2012  . Tubal ligation      Social History   Social History  . Marital Status: Married    Spouse Name: Bradly Chris  . Number of Children: 2  . Years of Education: N/A   Occupational History  . [employer]   Social History Main Topics  . Smoking status: Never Smoker   . Smokeless tobacco: Never Used     Comment: never used tobacco  . Alcohol Use: No  . Drug Use: No  . Sexual Activity:    Partners: Male   Other Topics Concern  . Not on file   Social History Narrative   From [place]    2 children  --- 2006, 2012              Medication List       This list is accurate as of: 10/09/15 11:59 PM.  Always use your most recent med list.               FLUoxetine 20 MG tablet  Commonly known as:  PROZAC  TAKE 2 TABLETS BY MOUTH EVERY DAY     multivitamin with minerals Tabs tablet  Take 1 tablet by mouth daily.           Objective:   Physical Exam BP 120/78 mmHg  Pulse 75  Temp(Src) 97.8 F (36.6 C) (Oral)  Ht 5\' 6"  (1.676 m)  Wt 210 lb (95.255 kg)  BMI 33.91 kg/m2  SpO2 99% General:   Well developed, well nourished . NAD.  HEENT:  Normocephalic . Face symmetric, atraumatic Skin: Not pale. Not jaundice Neurologic:  alert & oriented X3.    Speech normal, gait appropriate for age and unassisted Psych--  Cognition and judgment appear intact.  Cooperative with normal attention span and concentration.  Behavior appropriate. Slightly emotional one to talk about anxiety depression, still having some interpersonal issues with her husband     Assessment & Plan:   Assessment  Anxiety depression --- dx 2014 ADD dx 2015 , rx  meds 2015 , self d/c ~ 04-2015  CML ---  DX 2013 + PPD, s/p treatment, had a BCG Glaucoma suspect BTL   PLAN Psych depression: Continue with fluoxetine and couples counseling,she got  emotional when we talk about interpersonal relationship with her husband. She is counseled  Today;  we decided not to change medication. ADD: Self dc adderall  around 04-2015 after she ran out, currently managing ADD well. At last OV,  she reported RLS type of symptoms, they went away after she stopped adderall (s/e?). We agreed she will call if needs Adderall again. Primary care: Pneumonia shot RTC 03-2016, CPX

## 2015-10-09 NOTE — Patient Instructions (Signed)
  GO TO THE FRONT DESK Schedule a complete physical exam to be done in 6 months  Please be fasting

## 2015-10-11 DIAGNOSIS — Z09 Encounter for follow-up examination after completed treatment for conditions other than malignant neoplasm: Secondary | ICD-10-CM | POA: Insufficient documentation

## 2015-10-11 NOTE — Assessment & Plan Note (Signed)
Psych depression: Continue with fluoxetine and couples counseling,she got  emotional when we talk about interpersonal relationship with her husband. She is counseled  Today;  we decided not to change medication. ADD: Self dc adderall  around 04-2015 after she ran out, currently managing ADD well. At last OV,  she reported RLS type of symptoms, they went away after she stopped adderall (s/e?). We agreed she will call if needs Adderall again. Primary care: Pneumonia shot RTC 03-2016, CPX

## 2015-10-12 MED ORDER — DASATINIB 70 MG PO TABS: 70.0000 mg | ORAL_TABLET | Freq: Every day | ORAL | Status: DC

## 2015-10-12 NOTE — Addendum Note (Signed)
Addended by: Burney Gauze R on: 10/12/2015 05:05 PM   Modules accepted: Orders

## 2015-10-14 ENCOUNTER — Other Ambulatory Visit: Payer: Self-pay | Admitting: *Deleted

## 2015-10-14 DIAGNOSIS — C9210 Chronic myeloid leukemia, BCR/ABL-positive, not having achieved remission: Secondary | ICD-10-CM

## 2015-10-14 DIAGNOSIS — D509 Iron deficiency anemia, unspecified: Secondary | ICD-10-CM

## 2015-10-14 MED ORDER — DASATINIB 70 MG PO TABS: 70.0000 mg | ORAL_TABLET | Freq: Every day | ORAL | Status: DC

## 2015-10-27 ENCOUNTER — Encounter: Payer: Self-pay | Admitting: *Deleted

## 2015-11-02 ENCOUNTER — Telehealth: Payer: Self-pay | Admitting: Internal Medicine

## 2015-11-02 NOTE — Telephone Encounter (Signed)
Relation to [id number]  Call back number:[phone] Pharmacy: [facility] - [place], [address] AT [facility] [phone] (Phone) [phone] (Fax)         Reason for call:  Patient states her therapist advised an increase to 40 MG regarding FLUoxetine (PROZAC) due to the emotional concerns she is going thru. Please advise

## 2015-11-02 NOTE — Telephone Encounter (Signed)
Please advise 

## 2015-11-02 NOTE — Telephone Encounter (Signed)
Please contact the patient: When she was last seen, we decided not to increase the medication, however if she now ready to increase fluoxetine to 40 mg qd, that is okay. Send a new prescription: Fluoxetine 20 mg 2 tablets daily.#60, 2 RF RTC 2 months

## 2015-11-03 MED ORDER — FLUOXETINE HCL 20 MG PO TABS: 40.0000 mg | ORAL_TABLET | Freq: Every day | ORAL | Status: DC

## 2015-11-03 NOTE — Telephone Encounter (Signed)
LMOM informing Pt to increase Fluoxetine 20 mg to 2 tablet daily (Rx sent). Instructed her to F/U w/ Dr. Larose Kells in 2 months, she already has an appt scheduled in May 2017. Instructed her to call office if she has any questions or concerns.

## 2015-11-12 MED FILL — SPRYCEL 70 MG TABLET: 70 | 30 days supply | Qty: 30 | Fill #0

## 2015-12-15 MED FILL — SPRYCEL 70 MG TABLET: 70 | 30 days supply | Qty: 30 | Fill #1

## 2015-12-31 ENCOUNTER — Ambulatory Visit (HOSPITAL_BASED_OUTPATIENT_CLINIC_OR_DEPARTMENT_OTHER): Payer: BC Managed Care – PPO | Admitting: Hematology & Oncology

## 2015-12-31 ENCOUNTER — Encounter: Payer: Self-pay | Admitting: Hematology & Oncology

## 2015-12-31 ENCOUNTER — Other Ambulatory Visit (HOSPITAL_BASED_OUTPATIENT_CLINIC_OR_DEPARTMENT_OTHER): Payer: BC Managed Care – PPO

## 2015-12-31 VITALS — BP 110/66 | HR 59 | Temp 97.8°F | Resp 16 | Ht 66.0 in | Wt 212.0 lb

## 2015-12-31 DIAGNOSIS — C9212 Chronic myeloid leukemia, BCR/ABL-positive, in relapse: Secondary | ICD-10-CM

## 2015-12-31 DIAGNOSIS — C9210 Chronic myeloid leukemia, BCR/ABL-positive, not having achieved remission: Secondary | ICD-10-CM

## 2015-12-31 DIAGNOSIS — D509 Iron deficiency anemia, unspecified: Secondary | ICD-10-CM

## 2015-12-31 LAB — COMPREHENSIVE METABOLIC PANEL (CC13)
ALT: 25 IU/L (ref 0–32)
AST (SGOT): 27 IU/L (ref 0–40)
Albumin, Serum: 4.2 g/dL (ref 3.5–5.5)
Albumin/Globulin Ratio: 1.4 (ref 1.2–2.2)
Alkaline Phosphatase, S: 70 IU/L (ref 39–117)
BUN/Creatinine Ratio: 18 (ref 9–23)
BUN: 17 mg/dL (ref 6–24)
Bilirubin Total: 0.2 mg/dL (ref 0.0–1.2)
CALCIUM: 9.3 mg/dL (ref 8.7–10.2)
CO2: 27 mmol/L (ref 18–29)
Chloride, Ser: 103 mmol/L (ref 96–106)
Creatinine, Ser: 0.92 mg/dL (ref 0.57–1.00)
GFR, EST AFRICAN AMERICAN: 86 mL/min/{1.73_m2} (ref >59)
GFR, EST NON AFRICAN AMERICAN: 75 mL/min/{1.73_m2} (ref >59)
GLUCOSE: 97 mg/dL (ref 65–99)
Globulin, Total: 3 g/dL (ref 1.5–4.5)
Potassium, Ser: 4.1 mmol/L (ref 3.5–5.2)
Sodium: 142 mmol/L (ref 134–144)
TOTAL PROTEIN: 7.2 g/dL (ref 6.0–8.5)

## 2015-12-31 LAB — CBC WITH DIFFERENTIAL (CANCER CENTER ONLY)
BASO#: 0 10*3/uL (ref 0.0–0.2)
BASO%: 0.5 % (ref 0.0–2.0)
EOS%: 1.7 % (ref 0.0–7.0)
Eosinophils Absolute: 0.1 10*3/uL (ref 0.0–0.5)
HEMATOCRIT: 35.8 % (ref 34.8–46.6)
HGB: 12.1 g/dL (ref 11.6–15.9)
LYMPH#: 1.6 10*3/uL (ref 0.9–3.3)
LYMPH%: 38.5 % (ref 14.0–48.0)
MCH: 31.4 pg (ref 26.0–34.0)
MCHC: 33.8 g/dL (ref 32.0–36.0)
MCV: 93 fL (ref 81–101)
MONO#: 0.3 10*3/uL (ref 0.1–0.9)
MONO%: 7.1 % (ref 0.0–13.0)
NEUT#: 2.1 10*3/uL (ref 1.5–6.5)
NEUT%: 52.2 % (ref 39.6–80.0)
PLATELETS: 184 10*3/uL (ref 145–400)
RBC: 3.85 10*6/uL (ref 3.70–5.32)
RDW: 12.6 % (ref 11.1–15.7)
WBC: 4.1 10*3/uL (ref 3.9–10.0)

## 2015-12-31 NOTE — Progress Notes (Signed)
Hematology and Oncology Follow Up Visit  [name] [id number] 1971/05/06 45 y.o. 12/31/2015   Principle Diagnosis:   Chronic phase CML-molecular relapse  Current Therapy:    Sprycel 70 mg by mouth daily     Interim History:  [name] is back for followup. Unfortunately, we had to get her back on to Sprycel. We saw her in February, her BCR/ABL ratio is up to 14.5%. Because this, she had a molecular relapse of her CML. We got her back on Sprycel. She is doing well on the Sprycel.  She feels okay. She has no fatigue or weakness. She's gained some weight which she is not happy about.  As always, she and her family are headed down to [place] in June. She will be down for a month.  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 She has had no cough. She has had no change in bowel or bladder habits. She has had no rashes. She has had no joint issues. She has had no shortness of breath.  Overall, her performance status is ECOG 0.   Medications:  Current outpatient prescriptions:  .  dasatinib (SPRYCEL) 70 MG tablet, Take 1 tablet (70 mg total) by mouth daily., Disp: 90 tablet, Rfl: 1 .  FLUoxetine (PROZAC) 20 MG tablet, Take 2 tablets (40 mg total) by mouth daily., Disp: 60 tablet, Rfl: 2 .  Multiple Vitamin (MULTIVITAMIN WITH MINERALS) TABS tablet, Take 1 tablet by mouth daily., Disp: , Rfl:   Allergies: No Known Allergies  Past Medical History, Surgical history, Social history, and Family History were reviewed and updated.  Review of Systems: As above  Physical Exam:  height is 5\' 6"  (1.676 m) and weight is 212 lb (96.163 kg). Her oral temperature is 97.8 F (36.6 C). Her blood pressure is 110/66 and her pulse is 59. Her respiration is 16.   Well-developed well-nourished Hispanic female. Head and neck exam shows no adenopathy in the neck. There is no ocular or oral lesions. Lungs are clear. Cardiac exam regular rate and rhythm with no murmurs rubs or bruits. Abdomen is soft.  Has good bowel sounds. There is no fluid wave. There is no palpable liver edge. She has no palpable splenomegaly. Back exam shows no tenderness over the spine ribs or hips. Extremities shows no clubbing cyanosis or edema. Skin exam no rashes ecchymoses or petechia. Neurological exam is nonfocal.  Lab Results  Component Value Date   WBC 4.1 12/31/2015   HGB 12.1 12/31/2015   HCT 35.8 12/31/2015   MCV 93 12/31/2015   PLT 184 12/31/2015     Chemistry      Component Value Date/Time   NA 135 09/30/2015 1428   NA 138 05/25/2015 1506   NA 134 06/14/2013 1526   K 3.5 09/30/2015 1428   K 4.5 05/25/2015 1506   K 3.7 06/14/2013 1526   CL 101 09/30/2015 1428   CL 102 05/25/2015 1506   CL 105 06/14/2013 1526   CO2 26 09/30/2015 1428   CO2 29 05/25/2015 1506   CO2 26 06/14/2013 1526   BUN 19 09/30/2015 1428   BUN 13 05/25/2015 1506   BUN 19 06/14/2013 1526   CREATININE 0.78 09/30/2015 1428   CREATININE 0.80 05/25/2015 1506   CREATININE 0.9 06/14/2013 1526      Component Value Date/Time   CALCIUM 9.4 09/30/2015 1428   CALCIUM 9.4 05/25/2015 1506   CALCIUM 8.9 06/14/2013 1526   ALKPHOS 71 09/30/2015 1428  ALKPHOS 55 05/25/2015 1506   ALKPHOS 45 06/14/2013 1526   AST 20 09/30/2015 1428   AST 21 05/25/2015 1506   AST 19 06/14/2013 1526   ALT 14 09/30/2015 1428   ALT 21 05/25/2015 1506   ALT 12 06/14/2013 1526   BILITOT 0.3 09/30/2015 1428   BILITOT 0.4 05/25/2015 1506   BILITOT 0.50 06/14/2013 1526         Impression and Plan: [name] is 46 year old Hispanic female. She has chronic phase CML. She got into remission quickly with Sprycel. She has not had a detectable BCR / ABL ratio. As such, I thought we could get her off the Sprycel.  However, her BCR ABL ratio is now up. We will get her back into remission with Sprycel. I think that Sprycel will be able to get her back into remission fairly easily.  I told her that she probably will need to be a Sprycel long-term now. I  don't see any other way around this.  I will plan to see her back in 3 months.  Volanda Napoleon, MD 5/18/20175:53 PM

## 2016-01-01 LAB — IRON AND TIBC CHCC
IRON: 60 ug/dL (ref 27–159)
Iron Saturation: 23 % (ref 15–55)
Total Iron Binding Capacity: 259 ug/dL (ref 250–450)
UIBC: 199 ug/dL (ref 131–425)

## 2016-01-01 LAB — FERRITIN CHCC: Ferritin: 314 ng/mL — ABNORMAL HIGH (ref 15–150)

## 2016-01-07 MED FILL — SPRYCEL 70 MG TABLET: 70 | 30 days supply | Qty: 30 | Fill #2

## 2016-01-13 ENCOUNTER — Encounter: Payer: Self-pay | Admitting: Hematology & Oncology

## 2016-01-25 ENCOUNTER — Encounter: Payer: Self-pay | Admitting: Medical

## 2016-01-25 ENCOUNTER — Ambulatory Visit (INDEPENDENT_AMBULATORY_CARE_PROVIDER_SITE_OTHER): Payer: BC Managed Care – PPO | Admitting: Medical

## 2016-01-25 VITALS — BP 110/70 | HR 66 | Temp 98.1°F | Ht 66.0 in | Wt 215.6 lb

## 2016-01-25 DIAGNOSIS — M25572 Pain in left ankle and joints of left foot: Secondary | ICD-10-CM | POA: Diagnosis not present

## 2016-01-25 NOTE — Patient Instructions (Addendum)
Your ankle pain has been present for about 3 months. The ankle still appears swollen by exam. You states xrays were negative done by UC. I think it is time to see specialist. Will refer to sports medicine.  Follow up her as needed after sports medicine referal. RICE therapy. Also use ibuprofen for pain and inflammation.

## 2016-01-25 NOTE — Progress Notes (Signed)
Subjective:    Patient ID: [name], female    DOB: July 28, 1971, 45 y.o.   MRN: [id number]  HPI  Pt in with some foot pain. She states pain has been for a few month. She remembers exercising.Doing Zumba type activity. She remembers feeling pain. Since then pain has been intermittent and continuing. Pt went to a urgent care about one month ago. She had pain lateral malleolus. Xray done and was negative. Pt states uc told her she had plantar fascitis.   Review of Systems  Constitutional: Negative for fever, chills and fatigue.  Musculoskeletal:       Rt ankle pain.  And lateral aspect of achilles tendon.   Past Medical History  Diagnosis Date  . CML (chronic myelocytic leukemia) ([place]) 2013  . 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 Glaucoma suspect   . Anxiety and depression 04/02/2013     Social History   Social History  . Marital Status: Married    Spouse Name: Bradly Chris  . Number of Children: 2  . Years of Education: N/A   Occupational History  . [employer]   Social History Main Topics  . Smoking status: Never Smoker   . Smokeless tobacco: Never Used     Comment: never used tobacco  . Alcohol Use: No  . Drug Use: No  . Sexual Activity:    Partners: Male   Other Topics Concern  . Not on file   Social History Narrative   From [place]    2 children  --- 2006, 2012          Past Surgical History  Procedure Laterality Date  . Cesarean section  2006 & 2012  . Tubal ligation      Family History  Problem Relation Age of Onset  . Hypertension Maternal Grandmother   . Cancer Maternal Grandfather   . Heart disease Paternal Grandmother   . Stroke Paternal Grandfather   . Diabetes Neg Hx   . Colon cancer Neg Hx   . Breast cancer Neg Hx     No Known Allergies  Current Outpatient Prescriptions on File Prior to Visit  Medication Sig Dispense Refill  . dasatinib (SPRYCEL) 70 MG tablet Take 1 tablet (70 mg total) by  mouth daily. 90 tablet 1  . FLUoxetine (PROZAC) 20 MG tablet Take 2 tablets (40 mg total) by mouth daily. 60 tablet 2  . Multiple Vitamin (MULTIVITAMIN WITH MINERALS) TABS tablet Take 1 tablet by mouth daily.     No current facility-administered medications on file prior to visit.    BP 110/70 mmHg  Pulse 66  Temp(Src) 98.1 F (36.7 C) (Oral)  Ht 5\' 6"  (1.676 m)  Wt 215 lb 9.6 oz (97.796 kg)  BMI 34.82 kg/m2  SpO2 98%      Objective:   Physical Exam  General- No acute distress. Pleasant patient. Neck- Full range of motion, no jvd Lungs- Clear, even and unlabored. Heart- regular rate and rhythm. Neurologic- CNII- XII grossly intact.  Lt ankle- lateral malleolus pain on inversion and inversion. The talofibular ligment area feels thicker.   Lt foot- no direct heal tenderness to palpation.Or on other parts of the foot.      Assessment & Plan:  Your ankle pain has been present for about 3 months. The ankle still appears swollen by exam. You states xrays were negative done by UC. I think it is time to see  specialist. Will refer to sports medicine.  Follow up her as needed after sports medicine referal. RICE therapy. Also use ibuprofen for pain and inflammation. Tammara Massing, Percell Miller, PA-C

## 2016-01-25 NOTE — Progress Notes (Signed)
Pre visit review using our clinic review tool, if applicable. No additional management support is needed unless otherwise documented below in the visit note. 

## 2016-01-27 ENCOUNTER — Ambulatory Visit: Payer: BC Managed Care – PPO | Admitting: Family Medicine

## 2016-01-29 ENCOUNTER — Encounter: Payer: Self-pay | Admitting: Family Medicine

## 2016-01-29 ENCOUNTER — Ambulatory Visit (INDEPENDENT_AMBULATORY_CARE_PROVIDER_SITE_OTHER): Payer: BC Managed Care – PPO | Admitting: Family Medicine

## 2016-01-29 VITALS — BP 104/70 | HR 66 | Ht 63.0 in | Wt 210.0 lb

## 2016-01-29 DIAGNOSIS — M25572 Pain in left ankle and joints of left foot: Secondary | ICD-10-CM

## 2016-01-29 NOTE — Patient Instructions (Signed)
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 Ice the area for 15 minutes at a time, 3-4 times a day Aleve 2 tabs twice a day with food OR ibuprofen 3 tabs three times a day with food for pain and inflammation. Elevate above the level of your heart when possible The arch supports are very important for this. Avoid flat shoes, barefoot walking as much as possible. Use laceup ankle brace to help with stability while you recover from this injury. Wait 2-4 weeks until pain has improved before starting the theraband exercises 3 sets of 10 once a day. Consider physical therapy for strengthening and balance exercises in the future. Follow up with me when you return from [place].  Have a great trip!

## 2016-02-08 DIAGNOSIS — M25572 Pain in left ankle and joints of left foot: Secondary | ICD-10-CM | POA: Insufficient documentation

## 2016-02-08 NOTE — Assessment & Plan Note (Signed)
consistent with sinus tarsi syndrome, ankle instability.  Icing, nsaids.  Stressed importance of arch supports, ankle brace for stability and to allow this to resolve.  Avoid barefoot walking, flat shoes.  F/u when she returns from a trip to [place].

## 2016-02-08 NOTE — Progress Notes (Signed)
PCP and consultation requested by: Kathlene November, MD  Subjective:   HPI: Patient is a 45 y.o. female here for left ankle pain.  Patient reports she felt a pull in her left ankle about 3 months ago when doing zumba. Felt lateral mainly though previously had some plantar pain. Some swelling. Worse doing side to side motion. Pain level 5/10 at rest, up to 8/10 with motions and sharp. Tried supportive shoes, orthotics which helped. Lots of walking at work - worse after this. No skin changes, numbness.  Past Medical History  Diagnosis Date  . CML (chronic myelocytic leukemia) ([place]) 2013  . 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 Glaucoma suspect   . Anxiety and depression 04/02/2013    Current Outpatient Prescriptions on File Prior to Visit  Medication Sig Dispense Refill  . dasatinib (SPRYCEL) 70 MG tablet Take 1 tablet (70 mg total) by mouth daily. 90 tablet 1  . FLUoxetine (PROZAC) 20 MG tablet Take 2 tablets (40 mg total) by mouth daily. 60 tablet 2  . Multiple Vitamin (MULTIVITAMIN WITH MINERALS) TABS tablet Take 1 tablet by mouth daily.     No current facility-administered medications on file prior to visit.    Past Surgical History  Procedure Laterality Date  . Cesarean section  2006 & 2012  . Tubal ligation      No Known Allergies  Social History   Social History  . Marital Status: Married    Spouse Name: Bradly Chris  . Number of Children: 2  . Years of Education: N/A   Occupational History  . [employer]   Social History Main Topics  . Smoking status: Never Smoker   . Smokeless tobacco: Never Used     Comment: never used tobacco  . Alcohol Use: No  . Drug Use: No  . Sexual Activity:    Partners: Male   Other Topics Concern  . Not on file   Social History Narrative   From [place]    2 children  --- 2006, 2012          Family History  Problem Relation Age of Onset  . Hypertension Maternal Grandmother   . Cancer  Maternal Grandfather   . Heart disease Paternal Grandmother   . Stroke Paternal Grandfather   . Diabetes Neg Hx   . Colon cancer Neg Hx   . Breast cancer Neg Hx     BP 104/70 mmHg  Pulse 66  Ht 5\' 3"  (1.6 m)  Wt 210 lb (95.255 kg)  BMI 37.21 kg/m2  Review of Systems: See HPI above.    Objective:  Physical Exam:  Gen: NAD, comfortable in exam room  Left ankle: No gross deformity, swelling, ecchymoses FROM with 5/5 strength. TTP sinus tarsi.  No other tenderness about foot or ankle currently. 1+ ant drawer and talar tilt.   Negative syndesmotic compression. Thompsons test negative. NV intact distally.    Assessment & Plan:  1. Left ankle pain - consistent with sinus tarsi syndrome, ankle instability.  Icing, nsaids.  Stressed importance of arch supports, ankle brace for stability and to allow this to resolve.  Avoid barefoot walking, flat shoes.  F/u when she returns from a trip to [place].

## 2016-02-09 MED FILL — SPRYCEL 70 MG TABLET: 70 | 30 days supply | Qty: 30 | Fill #3

## 2016-03-07 ENCOUNTER — Other Ambulatory Visit: Payer: Self-pay

## 2016-03-07 MED ORDER — FLUOXETINE HCL 20 MG PO TABS: 40.0000 mg | ORAL_TABLET | Freq: Every day | ORAL | 2 refills | Status: DC

## 2016-03-25 MED FILL — SPRYCEL 70 MG TABLET: 70 | 30 days supply | Qty: 30 | Fill #4

## 2016-04-01 ENCOUNTER — Ambulatory Visit (HOSPITAL_BASED_OUTPATIENT_CLINIC_OR_DEPARTMENT_OTHER): Payer: BC Managed Care – PPO | Admitting: Hematology & Oncology

## 2016-04-01 ENCOUNTER — Other Ambulatory Visit (HOSPITAL_BASED_OUTPATIENT_CLINIC_OR_DEPARTMENT_OTHER): Payer: BC Managed Care – PPO

## 2016-04-01 ENCOUNTER — Encounter: Payer: Self-pay | Admitting: Hematology & Oncology

## 2016-04-01 VITALS — BP 128/68 | HR 68 | Temp 98.2°F | Resp 18 | Ht 63.0 in | Wt 218.0 lb

## 2016-04-01 DIAGNOSIS — C9210 Chronic myeloid leukemia, BCR/ABL-positive, not having achieved remission: Secondary | ICD-10-CM

## 2016-04-01 DIAGNOSIS — C9212 Chronic myeloid leukemia, BCR/ABL-positive, in relapse: Secondary | ICD-10-CM

## 2016-04-01 LAB — CBC WITH DIFFERENTIAL (CANCER CENTER ONLY)
BASO#: 0 10*3/uL (ref 0.0–0.2)
BASO%: 0.6 % (ref 0.0–2.0)
EOS ABS: 0.1 10*3/uL (ref 0.0–0.5)
EOS%: 1.7 % (ref 0.0–7.0)
HEMATOCRIT: 36.2 % (ref 34.8–46.6)
HGB: 12.3 g/dL (ref 11.6–15.9)
LYMPH#: 1.4 10*3/uL (ref 0.9–3.3)
LYMPH%: 38.2 % (ref 14.0–48.0)
MCH: 31.5 pg (ref 26.0–34.0)
MCHC: 34 g/dL (ref 32.0–36.0)
MCV: 93 fL (ref 81–101)
MONO#: 0.2 10*3/uL (ref 0.1–0.9)
MONO%: 6.5 % (ref 0.0–13.0)
NEUT%: 53 % (ref 39.6–80.0)
NEUTROS ABS: 1.9 10*3/uL (ref 1.5–6.5)
PLATELETS: 175 10*3/uL (ref 145–400)
RBC: 3.91 10*6/uL (ref 3.70–5.32)
RDW: 12.5 % (ref 11.1–15.7)
WBC: 3.5 10*3/uL — ABNORMAL LOW (ref 3.9–10.0)

## 2016-04-01 LAB — COMPREHENSIVE METABOLIC PANEL (CC13)
A/G RATIO: 1.4 (ref 1.2–2.2)
ALT: 19 IU/L (ref 0–32)
AST: 21 IU/L (ref 0–40)
Albumin, Serum: 4.2 g/dL (ref 3.5–5.5)
Alkaline Phosphatase, S: 74 IU/L (ref 39–117)
BILIRUBIN TOTAL: 0.3 mg/dL (ref 0.0–1.2)
BUN/Creatinine Ratio: 16 (ref 9–23)
BUN: 16 mg/dL (ref 6–24)
CALCIUM: 9.2 mg/dL (ref 8.7–10.2)
CHLORIDE: 98 mmol/L (ref 96–106)
Carbon Dioxide, Total: 28 mmol/L (ref 18–29)
Creatinine, Ser: 0.98 mg/dL (ref 0.57–1.00)
GFR, EST AFRICAN AMERICAN: 80 mL/min/{1.73_m2} (ref >59)
GFR, EST NON AFRICAN AMERICAN: 69 mL/min/{1.73_m2} (ref >59)
GLOBULIN, TOTAL: 2.9 g/dL (ref 1.5–4.5)
Glucose: 105 mg/dL — ABNORMAL HIGH (ref 65–99)
POTASSIUM: 3.8 mmol/L (ref 3.5–5.2)
SODIUM: 133 mmol/L — AB (ref 134–144)
Total Protein: 7.1 g/dL (ref 6.0–8.5)

## 2016-04-01 NOTE — Progress Notes (Signed)
Hematology and Oncology Follow Up Visit  [name] [id number] 03-31-1971 45 y.o. 04/01/2016   Principle Diagnosis:   Chronic phase CML-molecular relapse  Current Therapy:    Sprycel 70 mg by mouth daily     Interim History:  [name] is back for followup. She had a great time this summer down in [place]. She and her family went out for about 6 weeks. They really enjoy themselves. She was very kind and probably back in authentic gift from [place]. Is a horseshoe that has [place] printed on his. It is really nice and I will certainly enjoy this.  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  She has gained a little bit of weight. This bothers her a little bit.  She has had no problems with nausea or vomiting. She's had no problems with bowels or bladder. She's had no rashes. She's not noted any leg swelling. There's been no cough or shortness of breath.   Her last BCR says ABL ratio back in May was already down to 0.33%.   Overall, her performance status is ECOG 0.   Medications:  Current Outpatient Prescriptions:  .  dasatinib (SPRYCEL) 70 MG tablet, Take 1 tablet (70 mg total) by mouth daily., Disp: 90 tablet, Rfl: 1 .  FLUoxetine (PROZAC) 20 MG tablet, Take 2 tablets (40 mg total) by mouth daily., Disp: 60 tablet, Rfl: 2 .  Multiple Vitamin (MULTIVITAMIN WITH MINERALS) TABS tablet, Take 1 tablet by mouth daily., Disp: , Rfl:   Allergies: No Known Allergies  Past Medical History, Surgical history, Social history, and Family History were reviewed and updated.  Review of Systems: As above  Physical Exam:  height is 5\' 3"  (1.6 m) and weight is 218 lb (98.9 kg). Her temperature is 98.2 F (36.8 C). Her blood pressure is 128/68 and her pulse is 68. Her respiration is 18.   Well-developed well-nourished Hispanic female. Head and neck exam shows no adenopathy in the neck. There is no ocular or oral lesions. Lungs are clear.  Cardiac exam regular rate and rhythm with no murmurs rubs or bruits. Abdomen is soft. Has good bowel sounds. There is no fluid wave. There is no palpable liver edge. She has no palpable splenomegaly. Back exam shows no tenderness over the spine ribs or hips. Extremities shows no clubbing cyanosis or edema. Skin exam no rashes ecchymoses or petechia. Neurological exam is nonfocal.  Lab Results  Component Value Date   WBC 3.5 (L) 04/01/2016   HGB 12.3 04/01/2016   HCT 36.2 04/01/2016   MCV 93 04/01/2016   PLT 175 04/01/2016     Chemistry      Component Value Date/Time   NA 142 12/31/2015 1502   NA 134 06/14/2013 1526   K 4.1 12/31/2015 1502   K 3.7 06/14/2013 1526   CL 103 12/31/2015 1502   CL 105 06/14/2013 1526   CO2 27 12/31/2015 1502   CO2 26 06/14/2013 1526   BUN 17 12/31/2015 1502   BUN 19 06/14/2013 1526   CREATININE 0.92 12/31/2015 1502   CREATININE 0.9 06/14/2013 1526      Component Value Date/Time   CALCIUM 9.3 12/31/2015 1502   CALCIUM 8.9 06/14/2013 1526   ALKPHOS 70 12/31/2015 1502   ALKPHOS 45 06/14/2013 1526   AST 27 12/31/2015 1502   AST 19 06/14/2013 1526   ALT 25 12/31/2015 1502   ALT 12 06/14/2013 1526  BILITOT 0.2 12/31/2015 1502   BILITOT 0.50 06/14/2013 1526         Impression and Plan: [name] is 45 year old Hispanic female. She has chronic phase CML. She is responding again to the Sprycel. Her last BCR/ABL was 0.33%. This is certainly no surprise.   I'm glad that she had a great time with her family down a [place]. She will start school again next week. She is a Pharmacist, hospital.  I will plan to see her back in another 3 months. She will like to follow me [place], MD 8/18/20174:29 PM

## 2016-04-19 ENCOUNTER — Encounter: Payer: Self-pay | Admitting: Internal Medicine

## 2016-04-19 ENCOUNTER — Ambulatory Visit (INDEPENDENT_AMBULATORY_CARE_PROVIDER_SITE_OTHER): Payer: BC Managed Care – PPO | Admitting: Internal Medicine

## 2016-04-19 VITALS — BP 118/76 | HR 61 | Temp 97.7°F | Resp 12 | Ht 63.0 in | Wt 219.4 lb

## 2016-04-19 DIAGNOSIS — Z23 Encounter for immunization: Secondary | ICD-10-CM | POA: Diagnosis not present

## 2016-04-19 DIAGNOSIS — Z0000 Encounter for general adult medical examination without abnormal findings: Secondary | ICD-10-CM | POA: Diagnosis not present

## 2016-04-19 LAB — LIPID PANEL
CHOLESTEROL: 211 mg/dL — AB (ref 0–200)
HDL: 75.8 mg/dL (ref >39.00)
LDL Cholesterol: 121 mg/dL — ABNORMAL HIGH (ref 0–99)
NonHDL: 135.12
TRIGLYCERIDES: 71 mg/dL (ref 0.0–149.0)
Total CHOL/HDL Ratio: 3
VLDL: 14.2 mg/dL (ref 0.0–40.0)

## 2016-04-19 LAB — TSH: TSH: 2.55 u[IU]/mL (ref 0.35–4.50)

## 2016-04-19 LAB — HEMOGLOBIN A1C: Hgb A1c MFr Bld: 5.3 % (ref 4.6–6.5)

## 2016-04-19 NOTE — Progress Notes (Signed)
Subjective:    Patient ID: [name], female    DOB: August 22, 1970, 45 y.o.   MRN: [id number]  DOS:  04/19/2016 Type of visit - description : CPX Interval history: Note from oncology reviewed  Ran out of fluoxetine 4-5 weeks ago, feels no different, denies nausea, dizziness. Emotionally is doing "okay"   Review of Systems Constitutional: No fever. No chills. No unexplained wt changes. No unusual sweats. Reports chronic fatigue, for years, admits to some snoring. Feels sleepy sometimes  HEENT: No dental problems, no ear discharge, no facial swelling, no voice changes. No eye discharge, no eye  redness , no  intolerance to light   Respiratory: No wheezing , no  difficulty breathing. No cough , no mucus production  Cardiovascular: No CP, no leg swelling , no  Palpitations  GI: no nausea, no vomiting, no diarrhea , no  abdominal pain.  No blood in the stools. No dysphagia, no odynophagia    Endocrine: No polyphagia, no polyuria , no polydipsia  GU: No dysuria, gross hematuria, difficulty urinating. No urinary urgency, no frequency.  Musculoskeletal: No joint swellings or unusual aches or pains  Skin: No change in the color of the skin, palor , no  Rash  Allergic, immunologic: No environmental allergies , no  food allergies  Neurological: No dizziness no  syncope. No headaches. No diplopia, no slurred, no slurred speech, no motor deficits, no facial  Numbness  Hematological: No enlarged lymph nodes, no easy bruising , no unusual bleedings  Psychiatry: No suicidal ideas, no hallucinations, no beavior problems, no confusion.  see history of present illness   Past Medical History:  Diagnosis Date  . Anxiety and depression 04/02/2013  . CML (chronic myelocytic leukemia) ([place]) 2013  . Glaucoma suspect   . 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 CESAREAN SECTION  2006 & 2012  . TUBAL LIGATION       Social History   Social History  . Marital status: Married    Spouse name: Bradly Chris  . Number of children: 2  . Years of education: N/A   Occupational History  . [employer]   Social History Main Topics  . Smoking status: Never Smoker  . Smokeless tobacco: Never Used     Comment: never used tobacco  . Alcohol use No  . Drug use: No  . Sexual activity: Yes    Partners: Male   Other Topics Concern  . Not on file   Social History Narrative   From [place]    2 children  --- 2006, 2012           Family History  Problem Relation Age of Onset  . Hypertension Maternal Grandmother   . Cancer Maternal Grandfather   . Heart disease Paternal Grandmother   . Stroke Paternal Grandfather   . Diabetes Neg Hx   . Colon cancer Neg Hx   . Breast cancer Neg Hx        Medication List       Accurate as of 04/19/16  5:44 PM. Always use your most recent med list.          dasatinib 70 MG tablet Commonly known as:  SPRYCEL Take 1 tablet (70 mg total) by mouth daily.   multivitamin with minerals Tabs tablet Take 1 tablet by mouth daily.  Objective:   Physical Exam BP 118/76 (BP Location: Left Arm, Patient Position: Sitting, Cuff Size: Normal)   Pulse 61   Temp 97.7 F (36.5 C) (Oral)   Resp 12   Ht 5\' 3"  (1.6 m)   Wt 219 lb 6 oz (99.5 kg)   SpO2 97%   BMI 38.86 kg/m  General:   Well developed, Overweight appearing . NAD.  Neck: No  thyromegaly  HEENT:  Normocephalic . Face symmetric, atraumatic Lungs:  CTA B Normal respiratory effort, no intercostal retractions, no accessory muscle use. Heart: RRR,  no murmur.  No pretibial edema bilaterally  Abdomen:  Not distended, soft, non-tender. No rebound or rigidity.   Skin: Exposed areas without rash. Not pale. Not jaundice Neurologic:  alert & oriented X3.  Speech normal, gait appropriate for age and unassisted Strength symmetric and appropriate for age.  Psych: Cognition and  judgment appear intact.  Cooperative with normal attention span and concentration.  Behavior appropriate. No anxious or depressed appearing.     Assessment & Plan:   Assessment  Anxiety depression --- dx 2014 ADD dx 2015 , rx  meds 2015 , self d/c ~ 04-2015  CML ---  DX 2013 + PPD, s/p treatment, had a BCG Glaucoma suspect BTL   PLAN  Anxiety depression: Ran out of fluoxetine few weeks ago, feeling "ok". Patient will call if she feels needs to restart medication Fatigue: Chronic, for years, in 2016 the B12 and vitamin D levels  were normal. No anemia. Epworth scale today >> scored 14 (high!); CV implications of undiagnosed  OSA , treatment options such wt loss vs sleep study discussed.  For now elected to work on her weight . reasses on RTC RTC 6 mnths

## 2016-04-19 NOTE — Progress Notes (Signed)
Pre visit review using our clinic review tool, if applicable. No additional management support is needed unless otherwise documented below in the visit note. 

## 2016-04-19 NOTE — Patient Instructions (Signed)
GO TO THE LAB : Get the blood work     GO TO THE FRONT DESK Schedule your next appointment for a check up in 6 months , no fasting

## 2016-04-19 NOTE — Assessment & Plan Note (Addendum)
Td 2012 per pt; pnm 23 2017; prevnar 04-2016; flu shot today Female care per gyn, Dr Jodi Mourning  [place] Ao [phone] neg for AAA  Diet-exercise discussed  Labs: Cholesterol, A1c, TSH.

## 2016-04-19 NOTE — Assessment & Plan Note (Signed)
Anxiety depression: Ran out of fluoxetine few weeks ago, feeling "ok". Patient will call if she feels needs to restart medication Fatigue: Chronic, for years, in 2016 the B12 and vitamin D levels  were normal. No anemia. Epworth scale today >> scored 14 (high!); CV implications of undiagnosed  OSA , treatment options such wt loss vs sleep study discussed.  For now elected to work on her weight . reasses on RTC RTC 6 mnths

## 2016-04-28 MED FILL — SPRYCEL 70 MG TABLET: 70 | 30 days supply | Qty: 30 | Fill #5

## 2016-06-03 ENCOUNTER — Other Ambulatory Visit: Payer: Self-pay | Admitting: Hematology & Oncology

## 2016-06-03 DIAGNOSIS — C9210 Chronic myeloid leukemia, BCR/ABL-positive, not having achieved remission: Secondary | ICD-10-CM

## 2016-06-03 DIAGNOSIS — D509 Iron deficiency anemia, unspecified: Secondary | ICD-10-CM

## 2016-06-03 MED FILL — SPRYCEL 70 MG TABLET: 70 | 30 days supply | Qty: 30 | Fill #0

## 2016-06-09 IMAGING — US US AORTA
1 series · 12 of 12 positions shown · non-contrast
Comparison: None.

CLINICAL DATA: Palpable aorta.  Rule out aneurysm.

EXAM:
ULTRASOUND OF ABDOMINAL AORTA
TECHNIQUE: Ultrasound examination of the abdominal aorta was performed to
evaluate for abdominal aortic aneurysm.

[Series 1: us aorta · 0.26mm/px · 12 of 12 slices shown]
[im 1/12]
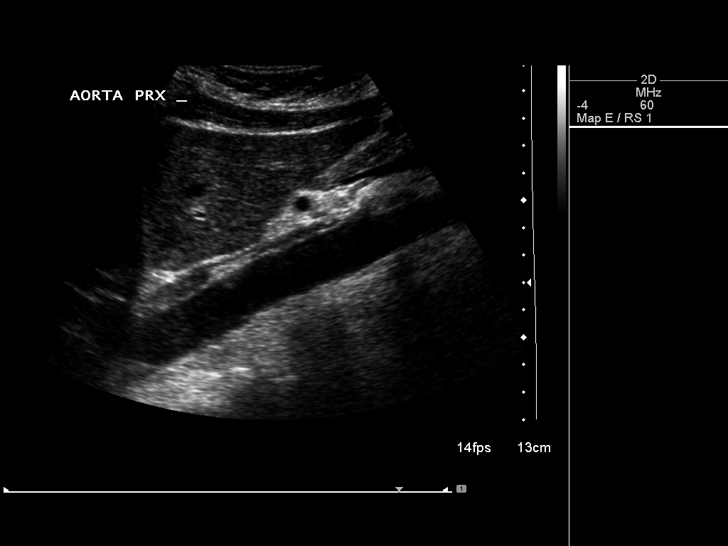
[im 2/12]
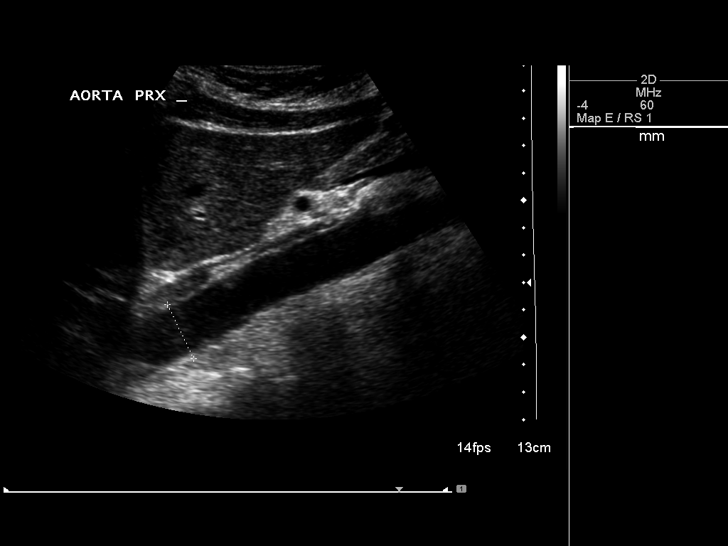
[im 3/12]
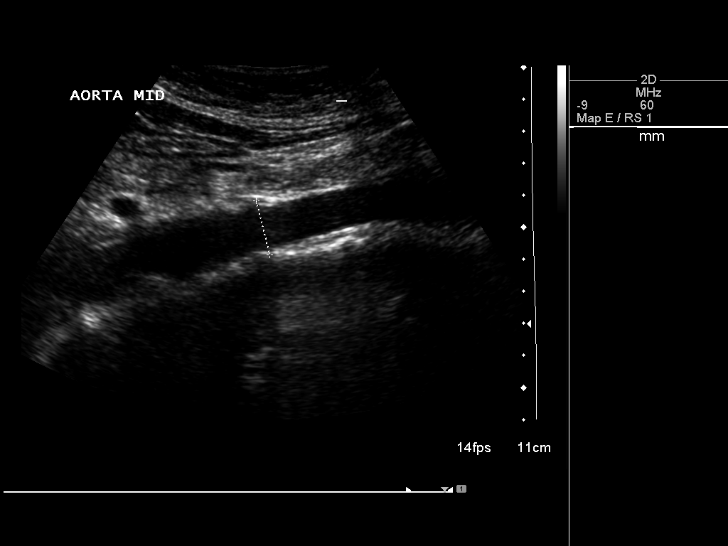
[im 4/12]
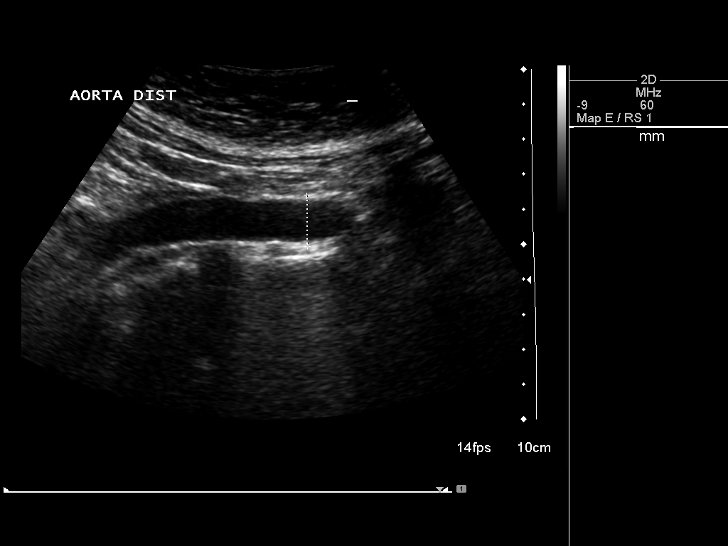
[im 5/12]
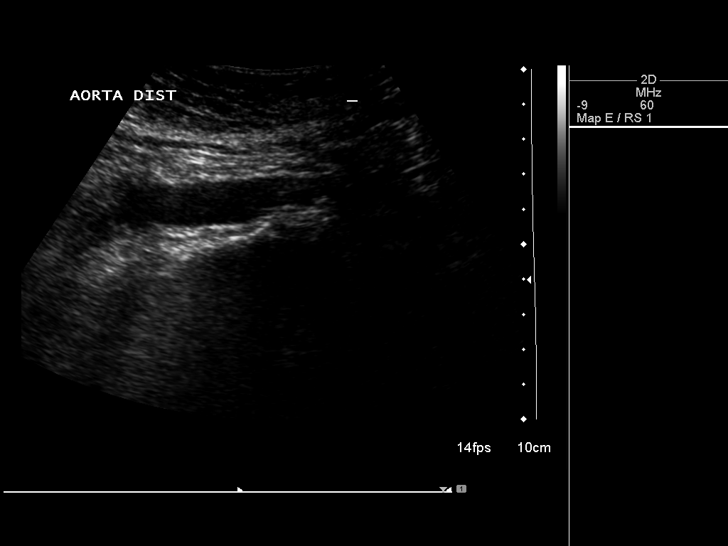
[im 6/12]
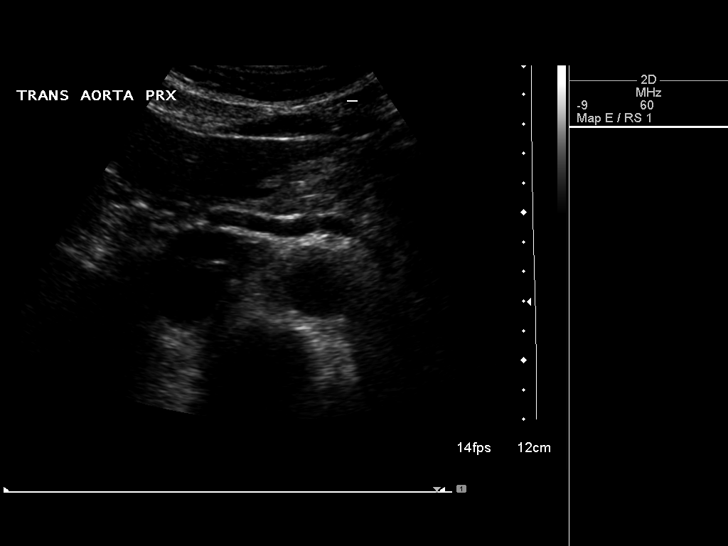
[im 7/12]
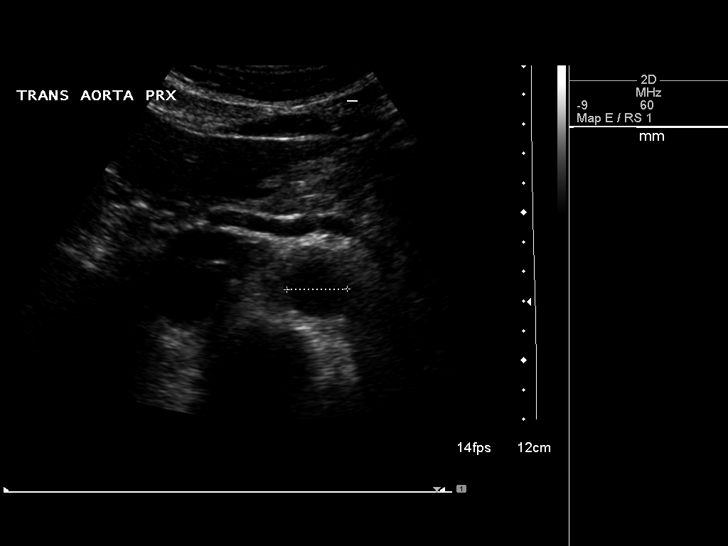
[im 8/12]
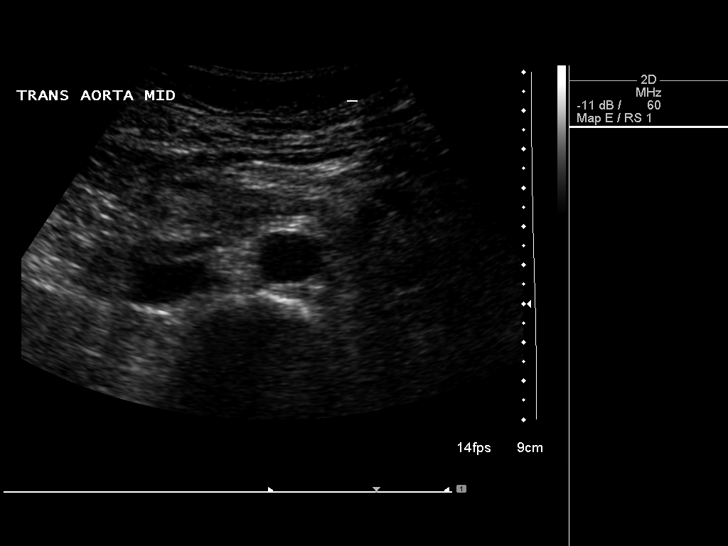
[im 9/12]
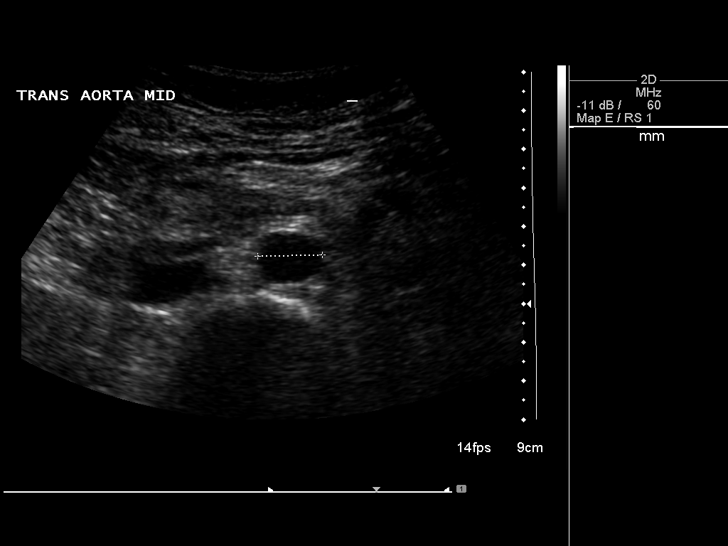
[im 10/12]
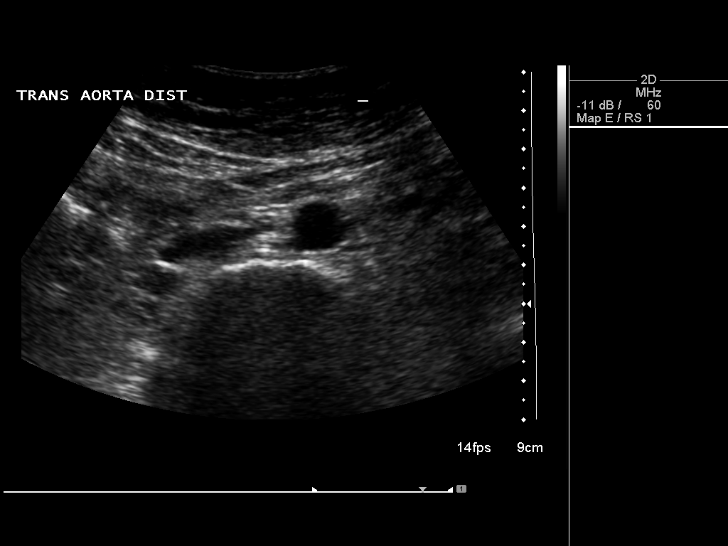
[im 11/12]
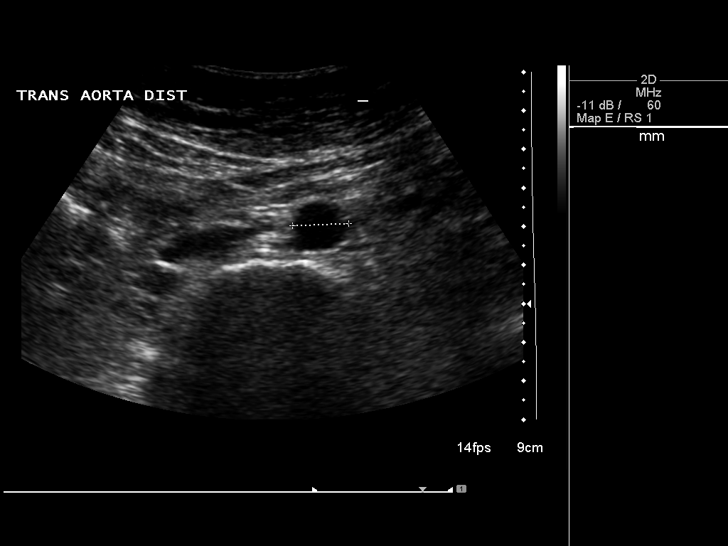
[im 12/12]
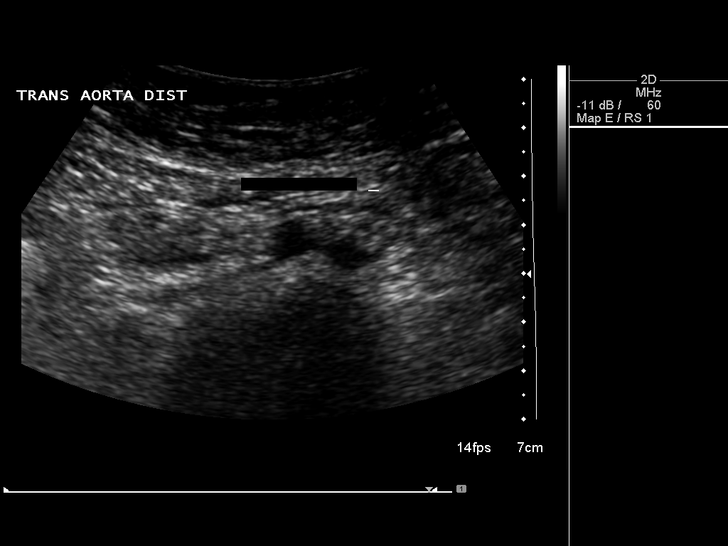

[12 of 12 positions shown; findings below may reference images not displayed]

FINDINGS: Abdominal Aorta

No aneurysm identified.

Maximum AP

Diameter:  2.2 cm proximally.  1.7 cm in the midportion

Maximum TRV

Diameter: 2.1 cm proximally.  1.7 cm in the midportion.
IMPRESSION: Normal

## 2016-07-06 ENCOUNTER — Ambulatory Visit (HOSPITAL_BASED_OUTPATIENT_CLINIC_OR_DEPARTMENT_OTHER): Payer: BC Managed Care – PPO | Admitting: Hematology & Oncology

## 2016-07-06 ENCOUNTER — Other Ambulatory Visit (HOSPITAL_BASED_OUTPATIENT_CLINIC_OR_DEPARTMENT_OTHER): Payer: BC Managed Care – PPO

## 2016-07-06 VITALS — BP 129/75 | HR 67 | Temp 98.0°F | Resp 18 | Wt 221.0 lb

## 2016-07-06 DIAGNOSIS — C9210 Chronic myeloid leukemia, BCR/ABL-positive, not having achieved remission: Secondary | ICD-10-CM

## 2016-07-06 DIAGNOSIS — C9212 Chronic myeloid leukemia, BCR/ABL-positive, in relapse: Secondary | ICD-10-CM | POA: Diagnosis not present

## 2016-07-06 LAB — COMPREHENSIVE METABOLIC PANEL (CC13)
ALBUMIN: 4.2 g/dL (ref 3.5–5.5)
ALT: 22 IU/L (ref 0–32)
AST (SGOT): 20 IU/L (ref 0–40)
Albumin/Globulin Ratio: 1.6 (ref 1.2–2.2)
Alkaline Phosphatase, S: 65 IU/L (ref 39–117)
BILIRUBIN TOTAL: 0.2 mg/dL (ref 0.0–1.2)
BUN / CREAT RATIO: 19 (ref 9–23)
BUN: 15 mg/dL (ref 6–24)
CALCIUM: 9 mg/dL (ref 8.7–10.2)
CHLORIDE: 102 mmol/L (ref 96–106)
CREATININE: 0.81 mg/dL (ref 0.57–1.00)
Carbon Dioxide, Total: 25 mmol/L (ref 18–29)
GFR, EST AFRICAN AMERICAN: 100 mL/min/{1.73_m2} (ref >59)
GFR, EST NON AFRICAN AMERICAN: 87 mL/min/{1.73_m2} (ref >59)
GLUCOSE: 97 mg/dL (ref 65–99)
Globulin, Total: 2.7 g/dL (ref 1.5–4.5)
Potassium, Ser: 3.7 mmol/L (ref 3.5–5.2)
Sodium: 137 mmol/L (ref 134–144)
TOTAL PROTEIN: 6.9 g/dL (ref 6.0–8.5)

## 2016-07-06 LAB — CBC WITH DIFFERENTIAL (CANCER CENTER ONLY)
BASO#: 0 10*3/uL (ref 0.0–0.2)
BASO%: 0.5 % (ref 0.0–2.0)
EOS ABS: 0.1 10*3/uL (ref 0.0–0.5)
EOS%: 1.6 % (ref 0.0–7.0)
HEMATOCRIT: 35.5 % (ref 34.8–46.6)
HEMOGLOBIN: 12.1 g/dL (ref 11.6–15.9)
LYMPH#: 1.2 10*3/uL (ref 0.9–3.3)
LYMPH%: 31.3 % (ref 14.0–48.0)
MCH: 31.3 pg (ref 26.0–34.0)
MCHC: 34.1 g/dL (ref 32.0–36.0)
MCV: 92 fL (ref 81–101)
MONO#: 0.3 10*3/uL (ref 0.1–0.9)
MONO%: 9.3 % (ref 0.0–13.0)
NEUT%: 57.3 % (ref 39.6–80.0)
NEUTROS ABS: 2.1 10*3/uL (ref 1.5–6.5)
Platelets: 179 10*3/uL (ref 145–400)
RBC: 3.87 10*6/uL (ref 3.70–5.32)
RDW: 12.7 % (ref 11.1–15.7)
WBC: 3.7 10*3/uL — AB (ref 3.9–10.0)

## 2016-07-06 MED FILL — SPRYCEL 70 MG TABLET: 70 | 30 days supply | Qty: 30 | Fill #1

## 2016-07-06 NOTE — Progress Notes (Signed)
Hematology and Oncology Follow Up Visit  [name] [id number] 1971-02-28 45 y.o. 07/06/2016   Principle Diagnosis:   Chronic phase CML-molecular relapse  Current Therapy:    Sprycel 70 mg by mouth daily     Interim History:  [name] is back for followup. She seems to be having a little more fatigue. She's having some arthralgias. She's not noted any obvious joint swelling.  We last checked her BCR/ABL ratio, it was down to 0.02%. This would put her in a MMR.   She's had no fever. She's had no cough. She's had no rashes.   There's been no issues with bowels or bladder. She's had no headache.   She and her family are going over to a friend's house for Thanksgiving.   Overall, her performance status is ECOG 0.   Medications:  Current Outpatient Prescriptions:  Marland Kitchen  Multiple Vitamin (MULTIVITAMIN WITH MINERALS) TABS tablet, Take 1 tablet by mouth daily., Disp: , Rfl:  .  SPRYCEL 70 MG tablet, TAKE 1 TABLET BY MOUTH DAILY, Disp: 90 tablet, Rfl: 1  Allergies: No Known Allergies  Past Medical History, Surgical history, Social history, and Family History were reviewed and updated.  Review of Systems: As above  Physical Exam:  weight is 221 lb (100.2 kg). Her oral temperature is 98 F (36.7 C). Her blood pressure is 129/75 and her pulse is 67. Her respiration is 18 and oxygen saturation is 99%.   Well-developed well-nourished Hispanic female. Head and neck exam shows no adenopathy in the neck. There is no ocular or oral lesions. Lungs are clear. Cardiac exam regular rate and rhythm with no murmurs rubs or bruits. Abdomen is soft. Has good bowel sounds. There is no fluid wave. There is no palpable liver edge. She has no palpable splenomegaly. Back exam shows no tenderness over the spine ribs or hips. Extremities shows no clubbing cyanosis or edema. Skin exam no rashes ecchymoses or petechia. Neurological exam is nonfocal.  Lab Results  Component Value Date   WBC 3.7 (L)  07/06/2016   HGB 12.1 07/06/2016   HCT 35.5 07/06/2016   MCV 92 07/06/2016   PLT 179 07/06/2016     Chemistry      Component Value Date/Time   NA 133 (L) 04/01/2016 1521   NA 134 06/14/2013 1526   K 3.8 04/01/2016 1521   K 3.7 06/14/2013 1526   CL 98 04/01/2016 1521   CL 105 06/14/2013 1526   CO2 28 04/01/2016 1521   CO2 26 06/14/2013 1526   BUN 16 04/01/2016 1521   BUN 19 06/14/2013 1526   CREATININE 0.98 04/01/2016 1521   CREATININE 0.9 06/14/2013 1526      Component Value Date/Time   CALCIUM 9.2 04/01/2016 1521   CALCIUM 8.9 06/14/2013 1526   ALKPHOS 74 04/01/2016 1521   ALKPHOS 45 06/14/2013 1526   AST 21 04/01/2016 1521   AST 19 06/14/2013 1526   ALT 19 04/01/2016 1521   ALT 12 06/14/2013 1526   BILITOT 0.3 04/01/2016 1521   BILITOT 0.50 06/14/2013 1526         Impression and Plan: [name] is 45 year old Hispanic female. She has chronic phase CML. She is responding again to the Sprycel. Her last BCR/ABL was 0.02%.This would put her in a MMR.  This is certainly no surprise.   I'm not sure if the Sprycel is causing some of the arthralgias in her ankles. She may have a little bit of tendinitis. I told  her to try some Aleve over-the-counter with food. I also told her to try some vitamin D.   I think we get her back in 3 more months. I think this would be reasonable.   Volanda Napoleon, MD 11/22/20173:31 PM

## 2016-07-08 LAB — LACTATE DEHYDROGENASE: LDH: 94 U/L — ABNORMAL LOW (ref 125–245)

## 2016-07-25 ENCOUNTER — Telehealth: Payer: Self-pay | Admitting: *Deleted

## 2016-07-25 NOTE — Telephone Encounter (Addendum)
Patient aware of results  ----- Message from Volanda Napoleon, MD sent at 07/25/2016  3:59 PM EST ----- Call - the CML is in remission!!!  Merry Christmas!!!  [name]

## 2016-08-04 MED FILL — SPRYCEL 70 MG TABLET: 70 | 30 days supply | Qty: 30 | Fill #2

## 2016-09-06 MED FILL — SPRYCEL 70 MG TABLET: 70 | 30 days supply | Qty: 30 | Fill #3

## 2016-10-06 ENCOUNTER — Ambulatory Visit (HOSPITAL_BASED_OUTPATIENT_CLINIC_OR_DEPARTMENT_OTHER): Payer: BC Managed Care – PPO | Admitting: Family

## 2016-10-06 ENCOUNTER — Other Ambulatory Visit (HOSPITAL_BASED_OUTPATIENT_CLINIC_OR_DEPARTMENT_OTHER): Payer: BC Managed Care – PPO

## 2016-10-06 VITALS — BP 122/78 | HR 70 | Temp 98.1°F | Wt 223.8 lb

## 2016-10-06 DIAGNOSIS — D500 Iron deficiency anemia secondary to blood loss (chronic): Secondary | ICD-10-CM

## 2016-10-06 DIAGNOSIS — R5383 Other fatigue: Secondary | ICD-10-CM

## 2016-10-06 DIAGNOSIS — C9211 Chronic myeloid leukemia, BCR/ABL-positive, in remission: Secondary | ICD-10-CM | POA: Diagnosis not present

## 2016-10-06 DIAGNOSIS — C9210 Chronic myeloid leukemia, BCR/ABL-positive, not having achieved remission: Secondary | ICD-10-CM

## 2016-10-06 DIAGNOSIS — N920 Excessive and frequent menstruation with regular cycle: Secondary | ICD-10-CM | POA: Diagnosis not present

## 2016-10-06 LAB — CBC WITH DIFFERENTIAL (CANCER CENTER ONLY)
BASO#: 0 10*3/uL (ref 0.0–0.2)
BASO%: 0.5 % (ref 0.0–2.0)
EOS%: 1.4 % (ref 0.0–7.0)
Eosinophils Absolute: 0.1 10*3/uL (ref 0.0–0.5)
HCT: 35.6 % (ref 34.8–46.6)
HGB: 12.3 g/dL (ref 11.6–15.9)
LYMPH#: 1.6 10*3/uL (ref 0.9–3.3)
LYMPH%: 36.9 % (ref 14.0–48.0)
MCH: 32 pg (ref 26.0–34.0)
MCHC: 34.6 g/dL (ref 32.0–36.0)
MCV: 93 fL (ref 81–101)
MONO#: 0.3 10*3/uL (ref 0.1–0.9)
MONO%: 7.7 % (ref 0.0–13.0)
NEUT%: 53.5 % (ref 39.6–80.0)
NEUTROS ABS: 2.3 10*3/uL (ref 1.5–6.5)
PLATELETS: 176 10*3/uL (ref 145–400)
RBC: 3.84 10*6/uL (ref 3.70–5.32)
RDW: 12.6 % (ref 11.1–15.7)
WBC: 4.3 10*3/uL (ref 3.9–10.0)

## 2016-10-06 LAB — COMPREHENSIVE METABOLIC PANEL (CC13)
ALBUMIN: 4.4 g/dL (ref 3.5–5.5)
ALK PHOS: 66 IU/L (ref 39–117)
ALT: 20 IU/L (ref 0–32)
AST (SGOT): 22 IU/L (ref 0–40)
Albumin/Globulin Ratio: 1.6 (ref 1.2–2.2)
BILIRUBIN TOTAL: 0.2 mg/dL (ref 0.0–1.2)
BUN / CREAT RATIO: 19 (ref 9–23)
BUN: 14 mg/dL (ref 6–24)
CHLORIDE: 101 mmol/L (ref 96–106)
Calcium, Ser: 9.8 mg/dL (ref 8.7–10.2)
Carbon Dioxide, Total: 28 mmol/L (ref 18–29)
Creatinine, Ser: 0.74 mg/dL (ref 0.57–1.00)
GFR calc Af Amer: 112 (ref >59)
GFR calc non Af Amer: 97 (ref >59)
GLOBULIN, TOTAL: 2.7 (ref 1.5–4.5)
GLUCOSE: 95 mg/dL (ref 65–99)
Potassium, Ser: 3.9 mmol/L (ref 3.5–5.2)
SODIUM: 136 mmol/L (ref 134–144)
Total Protein: 7.1 g/dL (ref 6.0–8.5)

## 2016-10-06 LAB — CHCC SATELLITE - SMEAR

## 2016-10-06 MED FILL — SPRYCEL 70 MG TABLET: 70 | 30 days supply | Qty: 30 | Fill #4

## 2016-10-06 NOTE — Progress Notes (Signed)
Hematology and Oncology Follow Up Visit  [name] [id number] Dec 13, 1970 46 y.o. 10/06/2016   Principle Diagnosis:  Chronic phase CML-molecular remission  Current Therapy:   Sprycel 70 mg by mouth daily    Interim History: [name] is here today with her husband for a follow-up. She is doing quite well but still having some fatigue. She is taking a prenatal vitamin and vit D supplement daily.  We have checked iron studies on her today and will see what these show.  She verbalized that she is taking her Sprycel 70 mg PO daily as prescribed and has responded nicely. Last BCR-ABL, in November, was 0.009%. Today's level result is pending. She still appears to be in major molecular remission.  She is still having heavy cycles and follows up with her gynecologist regularly. No problems with infections. She denies fever, chills, n/v, cough, headache, dizziness, SOB, chest pain, palpitations, constipation, blood in urine or stool.  She has maintained a good appetite and is staying well hydrated. Her weight is stable.  She has had no swelling, tenderness, numbness or tingling in her extremities  Medications:  Allergies as of 10/06/2016   No Known Allergies     Medication List       Accurate as of 10/06/16  3:39 PM. Always use your most recent med list.          multivitamin with minerals Tabs tablet Take 1 tablet by mouth daily.   SPRYCEL 70 MG tablet Generic drug:  dasatinib TAKE 1 TABLET BY MOUTH DAILY       Allergies: No Known Allergies  Past Medical History, Surgical history, Social history, and Family History were reviewed and updated.  Review of Systems: All other 10 point review of systems is negative.   Physical Exam:  weight is 223 lb 12.8 oz (101.5 kg). Her oral temperature is 98.1 F (36.7 C). Her blood pressure is 122/78 and her pulse is 70.   Wt Readings from Last 3 Encounters:  10/06/16 223 lb 12.8 oz (101.5 kg)  07/06/16 221 lb (100.2 kg)  04/19/16  219 lb 6 oz (99.5 kg)    Ocular: Sclerae unicteric, pupils equal, round and reactive to light Ear-nose-throat: Oropharynx clear, dentition fair Lymphatic: No cervical supraclavicular or axillary adenopathy Lungs no rales or rhonchi, good excursion bilaterally Heart regular rate and rhythm, no murmur appreciated Abd soft, nontender, positive bowel sounds, no liver or spleen tip palpated on exam, no fluid wave MSK no focal spinal tenderness, no joint edema Neuro: non-focal, well-oriented, appropriate affect Breasts: Deferred   Lab Results  Component Value Date   WBC 4.3 10/06/2016   HGB 12.3 10/06/2016   HCT 35.6 10/06/2016   MCV 93 10/06/2016   PLT 176 10/06/2016   Lab Results  Component Value Date   FERRITIN 314 (H) 12/31/2015   IRON 60 12/31/2015   TIBC 259 12/31/2015   UIBC 199 12/31/2015   IRONPCTSAT 23 12/31/2015   Lab Results  Component Value Date   RBC 3.84 10/06/2016   No results found for: KPAFRELGTCHN, LAMBDASER, KAPLAMBRATIO No results found for: IGGSERUM, IGA, IGMSERUM No results found for: Ronnald Ramp, A1GS, A2GS, Tillman Sers, SPEI   Chemistry      Component Value Date/Time   NA 137 07/06/2016 1439   NA 134 06/14/2013 1526   K 3.7 07/06/2016 1439   K 3.7 06/14/2013 1526   CL 102 07/06/2016 1439   CL 105 06/14/2013 1526   CO2 25 07/06/2016 1439  CO2 26 06/14/2013 1526   BUN 15 07/06/2016 1439   BUN 19 06/14/2013 1526   CREATININE 0.81 07/06/2016 1439   CREATININE 0.9 06/14/2013 1526      Component Value Date/Time   CALCIUM 9.0 07/06/2016 1439   CALCIUM 8.9 06/14/2013 1526   ALKPHOS 65 07/06/2016 1439   ALKPHOS 45 06/14/2013 1526   AST 20 07/06/2016 1439   AST 19 06/14/2013 1526   ALT 22 07/06/2016 1439   ALT 12 06/14/2013 1526   BILITOT 0.2 07/06/2016 1439   BILITOT 0.50 06/14/2013 1526     Impression and Plan: [name] is 46 yo Hispanic female with chronic phase CML. She is currently on Sprycel and responding  nicely. She appears to be in major molecular remission.  BCR-ABL results for today is pending.  She does complain of fatigue so we have checked her iron studies. We will see what these show and bring her back in next week for an infusion if needed.  We will go ahead and plan to see her back in 6 months for labs and follow-up.  She knows to call here with any questions or concerns and to go to the ED in the event of an emergency. We can certainly see her sooner if need be.   Eliezer Bottom, NP 2/22/20183:39 PM

## 2016-10-07 LAB — IRON AND TIBC
%SAT: 19 % — ABNORMAL LOW (ref 21–57)
Iron: 57 ug/dL (ref 41–142)
TIBC: 295 ug/dL (ref 236–444)
UIBC: 238 ug/dL (ref 120–384)

## 2016-10-07 LAB — FERRITIN: Ferritin: 189 ng/ml (ref 9–269)

## 2016-10-10 ENCOUNTER — Other Ambulatory Visit: Payer: Self-pay | Admitting: Family

## 2016-10-11 ENCOUNTER — Other Ambulatory Visit (HOSPITAL_BASED_OUTPATIENT_CLINIC_OR_DEPARTMENT_OTHER): Payer: BC Managed Care – PPO

## 2016-10-11 DIAGNOSIS — D500 Iron deficiency anemia secondary to blood loss (chronic): Secondary | ICD-10-CM

## 2016-10-11 DIAGNOSIS — C9211 Chronic myeloid leukemia, BCR/ABL-positive, in remission: Secondary | ICD-10-CM

## 2016-10-11 DIAGNOSIS — C9210 Chronic myeloid leukemia, BCR/ABL-positive, not having achieved remission: Secondary | ICD-10-CM

## 2016-10-11 DIAGNOSIS — N920 Excessive and frequent menstruation with regular cycle: Secondary | ICD-10-CM

## 2016-10-20 ENCOUNTER — Telehealth: Payer: Self-pay | Admitting: *Deleted

## 2016-10-20 NOTE — Telephone Encounter (Addendum)
Message left on cell phone voice mail  ----- Message from Eliezer Bottom, NP sent at 10/20/2016 11:24 AM EST ----- Regarding: BCR/ABL BCR/ABL not detected!!!   Sarah  ----- Message ----- From: Janalyn Rouse Sent: 10/20/2016  11:21 AM To: Eliezer Bottom, NP

## 2016-11-08 MED FILL — SPRYCEL 70 MG TABLET: 70 | 30 days supply | Qty: 30 | Fill #5

## 2016-12-12 ENCOUNTER — Other Ambulatory Visit: Payer: Self-pay | Admitting: Hematology & Oncology

## 2016-12-12 DIAGNOSIS — D509 Iron deficiency anemia, unspecified: Secondary | ICD-10-CM

## 2016-12-12 DIAGNOSIS — C9210 Chronic myeloid leukemia, BCR/ABL-positive, not having achieved remission: Secondary | ICD-10-CM

## 2016-12-12 MED FILL — SPRYCEL 70 MG TABLET: 70 | 90 days supply | Qty: 90 | Fill #0 | Status: TO

## 2016-12-30 ENCOUNTER — Other Ambulatory Visit (HOSPITAL_BASED_OUTPATIENT_CLINIC_OR_DEPARTMENT_OTHER): Payer: BC Managed Care – PPO

## 2016-12-30 ENCOUNTER — Ambulatory Visit (HOSPITAL_BASED_OUTPATIENT_CLINIC_OR_DEPARTMENT_OTHER): Payer: BC Managed Care – PPO | Admitting: Family

## 2016-12-30 VITALS — BP 119/69 | HR 69 | Temp 98.1°F | Resp 17 | Wt 220.8 lb

## 2016-12-30 DIAGNOSIS — C9210 Chronic myeloid leukemia, BCR/ABL-positive, not having achieved remission: Secondary | ICD-10-CM

## 2016-12-30 DIAGNOSIS — C9211 Chronic myeloid leukemia, BCR/ABL-positive, in remission: Secondary | ICD-10-CM

## 2016-12-30 DIAGNOSIS — D500 Iron deficiency anemia secondary to blood loss (chronic): Secondary | ICD-10-CM

## 2016-12-30 DIAGNOSIS — N920 Excessive and frequent menstruation with regular cycle: Secondary | ICD-10-CM

## 2016-12-30 LAB — COMPREHENSIVE METABOLIC PANEL (CC13)
ALBUMIN: 4.4 g/dL (ref 3.5–5.5)
ALK PHOS: 58 IU/L (ref 39–117)
ALT: 17 IU/L (ref 0–32)
AST: 20 IU/L (ref 0–40)
Albumin/Globulin Ratio: 1.8 (ref 1.2–2.2)
BUN/Creatinine Ratio: 17 (ref 9–23)
BUN: 13 mg/dL (ref 6–24)
Bilirubin Total: 0.2 mg/dL (ref 0.0–1.2)
CALCIUM: 9.4 mg/dL (ref 8.7–10.2)
CO2: 29 mmol/L (ref 18–29)
CREATININE: 0.76 mg/dL (ref 0.57–1.00)
Chloride, Ser: 103 mmol/L (ref 96–106)
GFR calc Af Amer: 108 mL/min/{1.73_m2} (ref >59)
GFR calc non Af Amer: 94 mL/min/{1.73_m2} (ref >59)
GLUCOSE: 93 mg/dL (ref 65–99)
Globulin, Total: 2.5 g/dL (ref 1.5–4.5)
Potassium, Ser: 3.7 mmol/L (ref 3.5–5.2)
Sodium: 136 mmol/L (ref 134–144)
Total Protein: 6.9 g/dL (ref 6.0–8.5)

## 2016-12-30 LAB — CBC WITH DIFFERENTIAL (CANCER CENTER ONLY)
BASO#: 0 10*3/uL (ref 0.0–0.2)
BASO%: 0.5 % (ref 0.0–2.0)
EOS%: 3 % (ref 0.0–7.0)
Eosinophils Absolute: 0.1 10*3/uL (ref 0.0–0.5)
HEMATOCRIT: 34.7 % — AB (ref 34.8–46.6)
HGB: 11.5 g/dL — ABNORMAL LOW (ref 11.6–15.9)
LYMPH#: 1.6 10*3/uL (ref 0.9–3.3)
LYMPH%: 40.6 % (ref 14.0–48.0)
MCH: 31 pg (ref 26.0–34.0)
MCHC: 33.1 g/dL (ref 32.0–36.0)
MCV: 94 fL (ref 81–101)
MONO#: 0.2 10*3/uL (ref 0.1–0.9)
MONO%: 5.7 % (ref 0.0–13.0)
NEUT#: 2 10*3/uL (ref 1.5–6.5)
NEUT%: 50.2 % (ref 39.6–80.0)
PLATELETS: 169 10*3/uL (ref 145–400)
RBC: 3.71 10*6/uL (ref 3.70–5.32)
RDW: 12.6 % (ref 11.1–15.7)
WBC: 4 10*3/uL (ref 3.9–10.0)

## 2016-12-30 LAB — CHCC SATELLITE - SMEAR

## 2016-12-30 NOTE — Progress Notes (Signed)
Hematology and Oncology Follow Up Visit  [name] [id number] [birth year] 46 y.o. 12/30/2016   Principle Diagnosis:  Chronic phase CML-molecular remission  Current Therapy:   Sprycel 70 mg by mouth daily   Interim History:  [name] is here today for follow-up. She is doing well but still having intermittent fatigue.  In February her BCR/ABL was not detected. Level today is pending.  She has also had some hot flashes and feels that she may be starting to go through the change of life. Her cycles are regular but have gotten heavier.  She has had no issue with frequent infections. No fever, chills, n/v, cough, rash, dizziness, SOB, chest pain, palpitations, abdominal pain or changes in bowel or bladder habits. No swelling, tenderness, numbness or tingling in her extremities. No c/o pain.  She has maintained a good appetite and is staying well hydrated. Her weight is stable.  She is excited for her family to go to the beach on memorial day weekend with four other families.  ECOG Performance Status: 1 - Symptomatic but completely ambulatory  Medications:  Allergies as of 12/30/2016   No Known Allergies     Medication List       Accurate as of 12/30/16  3:58 PM. Always use your most recent med list.          cholecalciferol 1000 units tablet Commonly known as:  VITAMIN D Take 1,000 Units by mouth daily.   prenatal multivitamin Tabs tablet Take 1 tablet by mouth daily at 12 noon.   SPRYCEL 70 MG tablet Generic drug:  dasatinib TAKE 1 TABLET BY MOUTH DAILY       Allergies: No Known Allergies  Past Medical History, Surgical history, Social history, and Family History were reviewed and updated.  Review of Systems: All other 10 point review of systems is negative.   Physical Exam:  weight is 220 lb 12.8 oz (100.2 kg). Her oral temperature is 98.1 F (36.7 C). Her blood pressure is 119/69 and her pulse is 69. Her respiration is 17 and oxygen saturation is 100%.    Wt Readings from Last 3 Encounters:  12/30/16 220 lb 12.8 oz (100.2 kg)  10/06/16 223 lb 12.8 oz (101.5 kg)  07/06/16 221 lb (100.2 kg)    Ocular: Sclerae unicteric, pupils equal, round and reactive to light Ear-nose-throat: Oropharynx clear, dentition fair Lymphatic: No cervical, supraclavicular or axillary adenopathy Lungs no rales or rhonchi, good excursion bilaterally Heart regular rate and rhythm, no murmur appreciated Abd soft, nontender, positive bowel sounds, no liver or spleen tip palpated on exam, no fluid wave  MSK no focal spinal tenderness, no joint edema Neuro: non-focal, well-oriented, appropriate affect Breasts: Deferred  Lab Results  Component Value Date   WBC 4.0 12/30/2016   HGB 11.5 (L) 12/30/2016   HCT 34.7 (L) 12/30/2016   MCV 94 12/30/2016   PLT 169 12/30/2016   Lab Results  Component Value Date   FERRITIN 189 10/06/2016   IRON 57 10/06/2016   TIBC 295 10/06/2016   UIBC 238 10/06/2016   IRONPCTSAT 19 (L) 10/06/2016   Lab Results  Component Value Date   RBC 3.71 12/30/2016   No results found for: KPAFRELGTCHN, LAMBDASER, KAPLAMBRATIO No results found for: IGGSERUM, IGA, IGMSERUM No results found for: Ronnald Ramp, A1GS, A2GS, Violet Baldy, MSPIKE, SPEI   Chemistry      Component Value Date/Time   NA 136 10/06/2016 1458   NA 134 06/14/2013 1526   K 3.9 10/06/2016  1458   K 3.7 06/14/2013 1526   CL 101 10/06/2016 1458   CL 105 06/14/2013 1526   CO2 28 10/06/2016 1458   CO2 26 06/14/2013 1526   BUN 14 10/06/2016 1458   BUN 19 06/14/2013 1526   CREATININE 0.74 10/06/2016 1458   CREATININE 0.9 06/14/2013 1526      Component Value Date/Time   CALCIUM 9.8 10/06/2016 1458   CALCIUM 8.9 06/14/2013 1526   ALKPHOS 66 10/06/2016 1458   ALKPHOS 45 06/14/2013 1526   AST 22 10/06/2016 1458   AST 19 06/14/2013 1526   ALT 20 10/06/2016 1458   ALT 12 06/14/2013 1526   BILITOT 0.2 10/06/2016 1458   BILITOT 0.50 06/14/2013 1526       Impression and Plan: [name] is a very pleasant 46 yo Hispanic female with chronic phase CML. She is doing well on Sprycel and her BCR/ABL in February was not detected. Level today is pending.  Hgb today is 11.5 with an MCV of 94. WBC count is 4.0 with a platelet count of 169.  She will continue on her same regimen with Sprycel.   We will plan to see her back in 6 months for repeat lab work and follow-up.  She will contact our office with any questions or concerns. We can certainly see her sooner if need be.   Eliezer Bottom, NP 5/18/20183:58 PM

## 2017-01-02 LAB — IRON AND TIBC
%SAT: 17 % — AB (ref 21–57)
Iron: 49 ug/dL (ref 41–142)
TIBC: 285 ug/dL (ref 236–444)
UIBC: 236 ug/dL (ref 120–384)

## 2017-01-02 LAB — FERRITIN: Ferritin: 158 ng/ml (ref 9–269)

## 2017-01-03 ENCOUNTER — Encounter: Payer: Self-pay | Admitting: *Deleted

## 2017-01-04 ENCOUNTER — Telehealth: Payer: Self-pay | Admitting: *Deleted

## 2017-01-04 NOTE — Telephone Encounter (Addendum)
Messages left and echart message sent. Will await call back  ----- Message from Eliezer Bottom, NP sent at 01/02/2017  5:03 PM EDT ----- Regarding: Iron  Iron saturation a little low. Is she taking her oral supplement?  Sarah  ----- Message ----- From: Interface, Lab In Three Zero One Sent: 12/30/2016   3:06 PM To: Eliezer Bottom, NP

## 2017-01-06 ENCOUNTER — Encounter: Payer: Self-pay | Admitting: *Deleted

## 2017-01-06 ENCOUNTER — Ambulatory Visit: Payer: BC Managed Care – PPO | Admitting: Family

## 2017-01-06 ENCOUNTER — Other Ambulatory Visit: Payer: BC Managed Care – PPO

## 2017-01-19 ENCOUNTER — Encounter: Payer: Self-pay | Admitting: *Deleted

## 2017-02-02 ENCOUNTER — Encounter: Payer: Self-pay | Admitting: Family

## 2017-02-02 ENCOUNTER — Ambulatory Visit (INDEPENDENT_AMBULATORY_CARE_PROVIDER_SITE_OTHER): Payer: BC Managed Care – PPO | Admitting: Family

## 2017-02-02 VITALS — BP 113/73 | HR 66 | Temp 98.4°F | Resp 16 | Wt 216.2 lb

## 2017-02-02 DIAGNOSIS — L03012 Cellulitis of left finger: Secondary | ICD-10-CM

## 2017-02-02 MED ORDER — CEPHALEXIN 500 MG PO CAPS: 500.0000 mg | ORAL_CAPSULE | Freq: Three times a day (TID) | ORAL | 0 refills | Status: AC

## 2017-02-02 NOTE — Progress Notes (Signed)
   Subjective:    Patient ID: [name], female    DOB: 10-01-1970, 46 y.o.   MRN: [id number]  HPI  [name] is a 46 yr old female who presents today with chief complaint of redness and swelling of the left middle finger. Symptoms began one week ago. Has previous hx of similar issues on her toenails.   Review of Systems    see HPI  Past Medical History:  Diagnosis Date  . Anxiety and depression 04/02/2013  . 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 Glaucoma suspect   . 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 Marital status: Married    Spouse name: Bradly Chris  . Number of children: 2  . Years of education: N/A   Occupational History  . [employer]   Social History Main Topics  . Smoking status: Never Smoker  . Smokeless tobacco: Never Used     Comment: never used tobacco  . Alcohol use No  . Drug use: No  . Sexual activity: Yes    Partners: Male   Other Topics Concern  . Not on file   Social History Narrative   From [place]    2 children  --- 2006, 2012          Past Surgical History:  Procedure Laterality Date  . CESAREAN SECTION  2006 & 2012  . TUBAL LIGATION      Family History  Problem Relation Age of Onset  . Hypertension Maternal Grandmother   . Cancer Maternal Grandfather   . Heart disease Paternal Grandmother   . Stroke Paternal Grandfather   . Diabetes Neg Hx   . Colon cancer Neg Hx   . Breast cancer Neg Hx     No Known Allergies  Current Outpatient Prescriptions on File Prior to Visit  Medication Sig Dispense Refill  . cholecalciferol (VITAMIN D) 1000 units tablet Take 1,000 Units by mouth daily.    . Prenatal Vit-Fe Fumarate-FA (PRENATAL MULTIVITAMIN) TABS tablet Take 1 tablet by mouth daily at 12 noon.    . SPRYCEL 70 MG tablet TAKE 1 TABLET BY MOUTH DAILY 90 tablet 1   No current facility-administered medications on file prior to visit.     BP  113/73 (BP Location: Left Arm, Cuff Size: Large)   Pulse 66   Temp 98.4 F (36.9 C) (Oral)   Resp 16   Wt 216 lb 3.2 oz (98.1 kg)   LMP 12/03/2016   SpO2 99%   BMI 38.30 kg/m    Objective:   Physical Exam  Constitutional: She appears well-developed. No distress.  Skin:  + swelling around nailbed left middle finger            Assessment & Plan:  Paronychia-  New. Advised pt as follows.  Soak your finger twice daily in hydrogen peroxide. Start keflex.  Call if increased pain/swelling, if you develop drainage or if not resolved in 1 week.

## 2017-02-02 NOTE — Patient Instructions (Signed)
Soak your finger twice daily in hydrogen peroxide. Start keflex.  Call if increased pain/swelling, if you develop drainage or if not resolved in 1 week.

## 2017-02-24 LAB — HM HIV SCREENING LAB: HM HIV Screening: NEGATIVE

## 2017-02-28 LAB — HM PAP SMEAR

## 2017-03-07 MED FILL — SPRYCEL 70 MG TABLET: 70 | 90 days supply | Qty: 90 | Fill #0

## 2017-03-23 ENCOUNTER — Telehealth: Payer: Self-pay | Admitting: Pharmacist

## 2017-03-23 NOTE — Telephone Encounter (Signed)
Oral Chemotherapy Pharmacist Encounter  Follow-Up Form  Called patient today to follow up regarding patient's oral chemotherapy medication: Dasatinib (Sprycel)  Original Start date of oral chemotherapy: 05/31/12  Pt is doing well today  Pt reports 0 tablets/doses of dasatinib missed in the last month.  Missed dose(s) attributed to: N/A  Pt reports the following side effects: N/A, patient reported no new issues with her dasatinib  Recent labs reviewed:  Other Issues: Made sure the patient was informed of the new [facility] process for her specialty medication.   Patient knows to call the office with questions or concerns. Oral Oncology Clinic will continue to follow.  Thank you,  Nuala Alpha, PharmD, BCPS 03/23/2017 9:15 AM Oral Oncology Clinic

## 2017-05-29 ENCOUNTER — Other Ambulatory Visit: Payer: Self-pay | Admitting: Hematology & Oncology

## 2017-05-29 DIAGNOSIS — C9210 Chronic myeloid leukemia, BCR/ABL-positive, not having achieved remission: Secondary | ICD-10-CM

## 2017-05-29 DIAGNOSIS — D509 Iron deficiency anemia, unspecified: Secondary | ICD-10-CM

## 2017-05-29 MED FILL — SPRYCEL 70 MG TABLET: 70 | 90 days supply | Qty: 90 | Fill #0

## 2017-06-16 ENCOUNTER — Telehealth: Payer: Self-pay | Admitting: Pharmacist

## 2017-06-16 NOTE — Telephone Encounter (Signed)
Oral Chemotherapy Pharmacist Encounter   Attempted to reach patient for follow up on oral medication: Sprycel (dasatinib). No answer. Left VM for patient to call back.  Darl Pikes, PharmD, BCPS Hematology/Oncology Clinical Pharmacist [facility] [phone]  06/16/2017 3:07 PM

## 2017-06-21 NOTE — Telephone Encounter (Signed)
Oral Chemotherapy Pharmacist Encounter   Attempted to reach patient on 11/5 and 11/7 for follow up on oral medication: Sprycel (dasatinib). No answer. Unable to leave VM  Darl Pikes, PharmD, BCPS Hematology/Oncology Clinical Pharmacist [facility] [phone]  06/21/2017 9:30 AM

## 2017-06-22 NOTE — Telephone Encounter (Signed)
Oral Chemotherapy Pharmacist Encounter   Attempted to reach patient for follow up on oral medication: Sprycel (dasatinib).. No answer. Left VM for patient to call back.    Darl Pikes, PharmD, BCPS Hematology/Oncology Clinical Pharmacist [facility] [phone]  06/22/2017 3:21 PM

## 2017-06-22 NOTE — Telephone Encounter (Signed)
Oral Chemotherapy Pharmacist Encounter  Follow-Up Form  Called patient today to follow up regarding patient's oral chemotherapy medication: Sprycel (dasatinib)  Original Start date of oral chemotherapy: 05/31/12  Pt reports 0 tablets/doses of dasatinib missed in the last month. Reviewed plan for missed doses.   Pt reports the following side effects: fatigue Ms. Bibbins reported an increase in fatigue in the last few months. She is still able to complete most of her daily activities. She will have a CBC completed at her next OV on 07/14/17. At this time she is okay with keeping her appointment where it is. Instructed Ms. Blinn to call the office if she feels she need to be seen soon.   Other Issues: N/A  Patient knows to call the office with questions or concerns. Oral Oncology Clinic will continue to follow.  Darl Pikes, PharmD, BCPS Hematology/Oncology Clinical Pharmacist [facility] [phone]  06/22/2017 4:09 PM

## 2017-07-14 ENCOUNTER — Encounter: Payer: Self-pay | Admitting: Hematology & Oncology

## 2017-07-14 ENCOUNTER — Other Ambulatory Visit (HOSPITAL_BASED_OUTPATIENT_CLINIC_OR_DEPARTMENT_OTHER): Payer: BC Managed Care – PPO

## 2017-07-14 ENCOUNTER — Other Ambulatory Visit: Payer: Self-pay

## 2017-07-14 ENCOUNTER — Ambulatory Visit (HOSPITAL_BASED_OUTPATIENT_CLINIC_OR_DEPARTMENT_OTHER): Payer: BC Managed Care – PPO | Admitting: Hematology & Oncology

## 2017-07-14 VITALS — BP 124/65 | HR 67 | Temp 98.1°F | Resp 16 | Wt 215.0 lb

## 2017-07-14 DIAGNOSIS — C9210 Chronic myeloid leukemia, BCR/ABL-positive, not having achieved remission: Secondary | ICD-10-CM

## 2017-07-14 DIAGNOSIS — Z23 Encounter for immunization: Secondary | ICD-10-CM

## 2017-07-14 DIAGNOSIS — C9211 Chronic myeloid leukemia, BCR/ABL-positive, in remission: Secondary | ICD-10-CM

## 2017-07-14 LAB — CMP (CANCER CENTER ONLY)
ALT(SGPT): 22 U/L (ref 10–47)
AST: 27 U/L (ref 11–38)
Albumin: 3.8 g/dL (ref 3.3–5.5)
Alkaline Phosphatase: 75 U/L (ref 26–84)
BUN: 21 mg/dL (ref 7–22)
CHLORIDE: 103 meq/L (ref 98–108)
CO2: 26 meq/L (ref 18–33)
Calcium: 9.5 mg/dL (ref 8.0–10.3)
Creat: 1.3 mg/dl — ABNORMAL HIGH (ref 0.6–1.2)
GLUCOSE: 99 mg/dL (ref 73–118)
POTASSIUM: 4.7 meq/L (ref 3.3–4.7)
Sodium: 143 mEq/L (ref 128–145)
Total Bilirubin: 0.5 mg/dl (ref 0.20–1.60)
Total Protein: 7.2 g/dL (ref 6.4–8.1)

## 2017-07-14 LAB — CBC WITH DIFFERENTIAL (CANCER CENTER ONLY)
BASO#: 0 10*3/uL (ref 0.0–0.2)
BASO%: 0.2 % (ref 0.0–2.0)
EOS ABS: 0.1 10*3/uL (ref 0.0–0.5)
EOS%: 1.7 % (ref 0.0–7.0)
HCT: 35.2 % (ref 34.8–46.6)
HGB: 11.8 g/dL (ref 11.6–15.9)
LYMPH#: 1.3 10*3/uL (ref 0.9–3.3)
LYMPH%: 32.5 % (ref 14.0–48.0)
MCH: 30.7 pg (ref 26.0–34.0)
MCHC: 33.5 g/dL (ref 32.0–36.0)
MCV: 92 fL (ref 81–101)
MONO#: 0.3 10*3/uL (ref 0.1–0.9)
MONO%: 8.2 % (ref 0.0–13.0)
NEUT#: 2.3 10*3/uL (ref 1.5–6.5)
NEUT%: 57.4 % (ref 39.6–80.0)
PLATELETS: 197 10*3/uL (ref 145–400)
RBC: 3.84 10*6/uL (ref 3.70–5.32)
RDW: 13.2 % (ref 11.1–15.7)
WBC: 4 10*3/uL (ref 3.9–10.0)

## 2017-07-14 LAB — CHCC SATELLITE - SMEAR

## 2017-07-14 MED ORDER — INFLUENZA VAC SPLIT QUAD 0.5 ML IM SUSY
0.5000 mL | PREFILLED_SYRINGE | Freq: Once | INTRAMUSCULAR | Status: AC
  Administered 2017-07-14: 0.5 mL via INTRAMUSCULAR

## 2017-07-14 MED ORDER — INFLUENZA VAC SPLIT QUAD 0.5 ML IM SUSY
PREFILLED_SYRINGE | INTRAMUSCULAR | Status: AC
  Filled 2017-07-14: qty 0.5

## 2017-07-14 NOTE — Progress Notes (Signed)
Hematology and Oncology Follow Up Visit  ELIAS BORDNER [id number] 06/28/1971 46 y.o. 07/14/2017   Principle Diagnosis:  Chronic phase CML-molecular remission  Current Therapy:   Sprycel 70 mg by mouth daily   Interim History:  Ms. Conrow is here today for follow-up.  She feels that she is having problems with Sprycel.  She is having a lot of arthralgias.  This mostly affects her feet.  Because of this, she is not able to exercise.  She is starting to gain back some weight that she had lost.    When we last saw her in May, her BCR/ABL ratio was not detected so she was in a major molecular remission.  I told her that the only way we will know if her symptoms are from Sprycel is to get her off Sprycel and have her not take any medicine for the [facility] for a month.  I reassured her that it would be okay for her to be off medication for 1 month.  Her quality of life is very important.  I want her to be able to function and not have issues from her medications.  Otherwise, she seems to be doing okay.  She had a nice Thanksgiving.  Her family is doing well.  She is still a Patent examiner.  She has had no cough.  There is no diarrhea.  There is no nausea or vomiting.  She has had no rash.  She has had no leg swelling.  ECOG Performance Status: 1 - Symptomatic but completely ambulatory  Medications:  Allergies as of 07/14/2017   No Known Allergies     Medication List        Accurate as of 07/14/17  3:58 PM. Always use your most recent med list.          cholecalciferol 1000 units tablet Commonly known as:  VITAMIN D Take 1,000 Units by mouth daily.   fluticasone 0.05 % cream Commonly known as:  CUTIVATE APP EXT AA OF THE EYELID UP TO BID PRN   ketoconazole 2 % cream Commonly known as:  NIZORAL APP EXT AA OF THE BUTTOCK BID PRN   PREMARIN vaginal cream Generic drug:  conjugated estrogens   prenatal multivitamin Tabs tablet Take 1 tablet by mouth daily at 12  noon.   SPRYCEL 70 MG tablet Generic drug:  dasatinib TAKE 1 TABLET BY MOUTH DAILY   Vitamins/Minerals Tabs Take by mouth.       Allergies: No Known Allergies  Past Medical History, Surgical history, Social history, and Family History were reviewed and updated.  Review of Systems: As stated in the interim history  Physical Exam:  weight is 215 lb (97.5 kg). Her oral temperature is 98.1 F (36.7 C). Her blood pressure is 124/65 and her pulse is 67. Her respiration is 16 and oxygen saturation is 100%.   Wt Readings from Last 3 Encounters:  07/14/17 215 lb (97.5 kg)  02/02/17 216 lb 3.2 oz (98.1 kg)  12/30/16 220 lb 12.8 oz (100.2 kg)    Well-developed and well-nourished Hispanic female.  Head and neck exam shows no ocular or oral lesions.  She has no palpable cervical or supraclavicular lymph nodes.  Thyroid is nonpalpable.  Lungs are clear bilaterally.  Cardiac exam regular rate and rhythm with no murmurs, rubs or bruits.  Abdomen is soft.  She has good bowel sounds.  There is no fluid wave.  There is no palpable liver or spleen tip.  Extremities shows no  clubbing, cyanosis or edema.  She has good range of motion of her joints.  She has some tenderness over the lower legs and feet.  Skin exam shows no rashes, ecchymoses or petechia.  Neurological exam shows no focal neurological deficits. Lab Results  Component Value Date   WBC 4.0 07/14/2017   HGB 11.8 07/14/2017   HCT 35.2 07/14/2017   MCV 92 07/14/2017   PLT 197 07/14/2017   Lab Results  Component Value Date   FERRITIN 158 12/30/2016   IRON 49 12/30/2016   TIBC 285 12/30/2016   UIBC 236 12/30/2016   IRONPCTSAT 17 (L) 12/30/2016   Lab Results  Component Value Date   RBC 3.84 07/14/2017   No results found for: KPAFRELGTCHN, LAMBDASER, KAPLAMBRATIO No results found for: IGGSERUM, IGA, IGMSERUM No results found for: Odetta Pink, SPEI   Chemistry      Component  Value Date/Time   NA 136 12/30/2016 1454   NA 134 06/14/2013 1526   K 3.7 12/30/2016 1454   K 3.7 06/14/2013 1526   CL 103 12/30/2016 1454   CL 105 06/14/2013 1526   CO2 29 12/30/2016 1454   CO2 26 06/14/2013 1526   BUN 13 12/30/2016 1454   BUN 19 06/14/2013 1526   CREATININE 0.76 12/30/2016 1454   CREATININE 0.9 06/14/2013 1526      Component Value Date/Time   CALCIUM 9.4 12/30/2016 1454   CALCIUM 8.9 06/14/2013 1526   ALKPHOS 58 12/30/2016 1454   ALKPHOS 45 06/14/2013 1526   AST 20 12/30/2016 1454   AST 19 06/14/2013 1526   ALT 17 12/30/2016 1454   ALT 12 06/14/2013 1526   BILITOT 0.2 12/30/2016 1454   BILITOT 0.50 06/14/2013 1526      Impression and Plan: Ms. Baumgardner is a very pleasant 46 yo Hispanic female with chronic phase CML.   So far, she has been in a major molecular remission.  We will see what happens when we stop the Sprycel.  If her symptoms improve, then we will switch her over to Tasigna.  This might be reasonable.  I will plan to see her back in 4 weeks or so.  I want to make sure that we keep close tabs on her.  Volanda Napoleon, MD 11/30/20183:58 PM

## 2017-07-19 ENCOUNTER — Other Ambulatory Visit: Payer: BC Managed Care – PPO

## 2017-07-19 DIAGNOSIS — C9210 Chronic myeloid leukemia, BCR/ABL-positive, not having achieved remission: Secondary | ICD-10-CM

## 2017-07-28 ENCOUNTER — Encounter: Payer: Self-pay | Admitting: Hematology & Oncology

## 2017-08-02 ENCOUNTER — Telehealth: Payer: Self-pay | Admitting: *Deleted

## 2017-08-02 NOTE — Telephone Encounter (Addendum)
Message left on cell phone voice mail  ----- Message from Volanda Napoleon, MD sent at 08/02/2017  3:08 PM EST ----- Call - CML is in remission!!!  Merry Christmas!!  [name]

## 2017-08-10 ENCOUNTER — Other Ambulatory Visit (HOSPITAL_BASED_OUTPATIENT_CLINIC_OR_DEPARTMENT_OTHER): Payer: BC Managed Care – PPO

## 2017-08-10 ENCOUNTER — Ambulatory Visit (HOSPITAL_BASED_OUTPATIENT_CLINIC_OR_DEPARTMENT_OTHER): Payer: BC Managed Care – PPO | Admitting: Hematology & Oncology

## 2017-08-10 ENCOUNTER — Encounter: Payer: Self-pay | Admitting: Hematology & Oncology

## 2017-08-10 ENCOUNTER — Other Ambulatory Visit: Payer: Self-pay

## 2017-08-10 VITALS — BP 121/83 | HR 73 | Temp 97.9°F | Resp 18 | Wt 213.0 lb

## 2017-08-10 DIAGNOSIS — C9211 Chronic myeloid leukemia, BCR/ABL-positive, in remission: Secondary | ICD-10-CM

## 2017-08-10 DIAGNOSIS — C9210 Chronic myeloid leukemia, BCR/ABL-positive, not having achieved remission: Secondary | ICD-10-CM

## 2017-08-10 LAB — CBC WITH DIFFERENTIAL (CANCER CENTER ONLY)
BASO#: 0 10*3/uL (ref 0.0–0.2)
BASO%: 0.5 % (ref 0.0–2.0)
EOS ABS: 0.1 10*3/uL (ref 0.0–0.5)
EOS%: 2.1 % (ref 0.0–7.0)
HCT: 36.3 % (ref 34.8–46.6)
HEMOGLOBIN: 12.2 g/dL (ref 11.6–15.9)
LYMPH#: 1.3 10*3/uL (ref 0.9–3.3)
LYMPH%: 34 % (ref 14.0–48.0)
MCH: 30.3 pg (ref 26.0–34.0)
MCHC: 33.6 g/dL (ref 32.0–36.0)
MCV: 90 fL (ref 81–101)
MONO#: 0.3 10*3/uL (ref 0.1–0.9)
MONO%: 7.3 % (ref 0.0–13.0)
NEUT%: 56.1 % (ref 39.6–80.0)
NEUTROS ABS: 2.1 10*3/uL (ref 1.5–6.5)
PLATELETS: 174 10*3/uL (ref 145–400)
RBC: 4.03 10*6/uL (ref 3.70–5.32)
RDW: 12.9 % (ref 11.1–15.7)
WBC: 3.8 10*3/uL — ABNORMAL LOW (ref 3.9–10.0)

## 2017-08-10 LAB — CMP (CANCER CENTER ONLY)
ALK PHOS: 65 U/L (ref 26–84)
ALT: 24 U/L (ref 10–47)
AST: 19 U/L (ref 11–38)
Albumin: 3.6 g/dL (ref 3.3–5.5)
BILIRUBIN TOTAL: 0.5 mg/dL (ref 0.20–1.60)
BUN: 21 mg/dL (ref 7–22)
CO2: 31 mEq/L (ref 18–33)
Calcium: 9.2 mg/dL (ref 8.0–10.3)
Chloride: 106 mEq/L (ref 98–108)
Creat: 0.8 mg/dl (ref 0.6–1.2)
GLUCOSE: 97 mg/dL (ref 73–118)
Potassium: 4.2 mEq/L (ref 3.3–4.7)
Sodium: 143 mEq/L (ref 128–145)
Total Protein: 6.8 g/dL (ref 6.4–8.1)

## 2017-08-10 MED ORDER — DASATINIB 50 MG PO TABS: 50.0000 mg | ORAL_TABLET | Freq: Every day | ORAL | 12 refills | Status: DC

## 2017-08-10 NOTE — Progress Notes (Signed)
Hematology and Oncology Follow Up Visit  [name] [id number] 1970-12-30 46 y.o. 08/10/2017   Principle Diagnosis:  Chronic phase CML-molecular remission  Current Therapy:   Sprycel 50 mg by mouth daily start on 08/15/2017   Interim History:  [name] is here today for follow-up.  She has been off of Sprycel because of arthralgias.  However, it does not seem like the arthralgias are that much better.  She says they may be a little bit better.  She thinks that something else might be causing the arthralgias.  She is still in a major molecular remission.  Her last BCR/ABL ratio was 0.003%.  This was in December.  We will get her restarted on Sprycel.  I will decrease her dose down to 50 mg daily.  She had a nice Christmas.  She is a Radio producer.  She had awful whole week from school with the snowstorm that we had.  She has had no cough.  There is no diarrhea.  There is no nausea or vomiting.  She has had no rash.  She has had no leg swelling.  ECOG Performance Status: 1 - Symptomatic but completely ambulatory  Medications:  Allergies as of 08/10/2017   No Known Allergies     Medication List        Accurate as of 08/10/17  5:33 PM. Always use your most recent med list.          cholecalciferol 1000 units tablet Commonly known as:  VITAMIN D Take 1,000 Units by mouth daily.   fluticasone 0.05 % cream Commonly known as:  CUTIVATE APP EXT AA OF THE EYELID UP TO BID PRN   ketoconazole 2 % cream Commonly known as:  NIZORAL APP EXT AA OF THE BUTTOCK BID PRN   PREMARIN vaginal cream Generic drug:  conjugated estrogens   prenatal multivitamin Tabs tablet Take 1 tablet by mouth daily at 12 noon.   Vitamins/Minerals Tabs Take by mouth.       Allergies: No Known Allergies  Past Medical History, Surgical history, Social history, and Family History were reviewed and updated.  Review of Systems: As stated in the interim history  Physical Exam:  weight  is 213 lb (96.6 kg). Her oral temperature is 97.9 F (36.6 C). Her blood pressure is 121/83 and her pulse is 73. Her respiration is 18 and oxygen saturation is 100%.   Wt Readings from Last 3 Encounters:  08/10/17 213 lb (96.6 kg)  07/14/17 215 lb (97.5 kg)  02/02/17 216 lb 3.2 oz (98.1 kg)    Physical Exam  Constitutional: She is oriented to person, place, and time.  HENT:  Head: Normocephalic and atraumatic.  Mouth/Throat: Oropharynx is clear and moist.  Eyes: EOM are normal. Pupils are equal, round, and reactive to light.  Neck: Normal range of motion.  Cardiovascular: Normal rate, regular rhythm and normal heart sounds.  Pulmonary/Chest: Effort normal and breath sounds normal.  Abdominal: Soft. Bowel sounds are normal.  Musculoskeletal: Normal range of motion. She exhibits no edema, tenderness or deformity.  Lymphadenopathy:    She has no cervical adenopathy.  Neurological: She is alert and oriented to person, place, and time.  Skin: Skin is warm and dry. No rash noted. No erythema.  Psychiatric: She has a normal mood and affect. Her behavior is normal. Judgment and thought content normal.  Vitals reviewed.   Lab Results  Component Value Date   WBC 3.8 (L) 08/10/2017   HGB 12.2 08/10/2017   HCT  36.3 08/10/2017   MCV 90 08/10/2017   PLT 174 08/10/2017   Lab Results  Component Value Date   FERRITIN 158 12/30/2016   IRON 49 12/30/2016   TIBC 285 12/30/2016   UIBC 236 12/30/2016   IRONPCTSAT 17 (L) 12/30/2016   Lab Results  Component Value Date   RBC 4.03 08/10/2017   No results found for: KPAFRELGTCHN, LAMBDASER, KAPLAMBRATIO No results found for: IGGSERUM, IGA, IGMSERUM No results found for: Ronnald Ramp, A1GS, Nelida Meuse, SPEI   Chemistry      Component Value Date/Time   NA 143 08/10/2017 1518   K 4.2 08/10/2017 1518   CL 106 08/10/2017 1518   CO2 31 08/10/2017 1518   BUN 21 08/10/2017 1518   CREATININE 0.8 08/10/2017  1518      Component Value Date/Time   CALCIUM 9.2 08/10/2017 1518   ALKPHOS 65 08/10/2017 1518   AST 19 08/10/2017 1518   ALT 24 08/10/2017 1518   BILITOT 0.50 08/10/2017 1518      Impression and Plan: [name] is a very pleasant 46 yo Hispanic female with chronic phase CML.   So far, she has been in a major molecular remission.  We will see how she does with a 50 mg dose of Sprycel.  I want to see her back in a couple months.  If all looks good, then we will see about moving her appointments back out to 4-96-month intervals.  She would be happy with this.  Volanda Napoleon, MD 12/27/20185:33 PM

## 2017-08-16 ENCOUNTER — Encounter: Payer: Self-pay | Admitting: Hematology & Oncology

## 2017-08-24 ENCOUNTER — Encounter: Payer: Self-pay | Admitting: *Deleted

## 2017-08-29 ENCOUNTER — Ambulatory Visit (INDEPENDENT_AMBULATORY_CARE_PROVIDER_SITE_OTHER): Payer: BC Managed Care – PPO | Admitting: Internal Medicine

## 2017-08-29 ENCOUNTER — Encounter: Payer: Self-pay | Admitting: Internal Medicine

## 2017-08-29 VITALS — BP 128/78 | HR 68 | Temp 97.8°F | Resp 14 | Ht 63.0 in | Wt 219.4 lb

## 2017-08-29 DIAGNOSIS — F329 Major depressive disorder, single episode, unspecified: Secondary | ICD-10-CM

## 2017-08-29 DIAGNOSIS — M722 Plantar fascial fibromatosis: Secondary | ICD-10-CM | POA: Diagnosis not present

## 2017-08-29 DIAGNOSIS — K648 Other hemorrhoids: Secondary | ICD-10-CM | POA: Diagnosis not present

## 2017-08-29 DIAGNOSIS — Z0000 Encounter for general adult medical examination without abnormal findings: Secondary | ICD-10-CM

## 2017-08-29 DIAGNOSIS — F419 Anxiety disorder, unspecified: Secondary | ICD-10-CM

## 2017-08-29 MED ORDER — KETOCONAZOLE 2 % EX CREA: 1.0000 "application " | TOPICAL_CREAM | Freq: Every day | CUTANEOUS | 0 refills | Status: DC

## 2017-08-29 MED ORDER — HYDROCORTISONE ACETATE 25 MG RE SUPP: 25.0000 mg | Freq: Two times a day (BID) | RECTAL | 1 refills | Status: DC | PRN

## 2017-08-29 MED ORDER — LIDOCAINE (ANORECTAL) 5 % EX CREA: TOPICAL_CREAM | CUTANEOUS | 2 refills | Status: DC

## 2017-08-29 NOTE — Progress Notes (Signed)
Pre visit review using our clinic review tool, if applicable. No additional management support is needed unless otherwise documented below in the visit note. 

## 2017-08-29 NOTE — Progress Notes (Signed)
Subjective:    Patient ID: [name], female    DOB: May 24, 1971, 47 y.o.   MRN: [id number]  DOS:  08/29/2017 Type of visit - description : cpx Interval history: We discussed multiple other issues Unable to lose weight, trying to eat healthy but her appetite is robust. Excessive thirst at night Complaining of sharp piercing pain at both heels.  That prevents her from exercising Has irritation and itching around the anus, has seen dermatology and gynecology, was prescribed fluticasone per dermatology but that only helps temporarily. Continue with occasional depression, worried about her family back in [place], that creates some problems at home with her husband.  No suicidal ideas.  On no medication.  Review of Systems  Other than above, a 14 point review of systems is negative       Past Medical History:  Diagnosis Date  . Anxiety and depression 04/02/2013  . 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 Glaucoma suspect   . 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 CESAREAN SECTION  2006 & 2012  . TUBAL LIGATION      Social History   Socioeconomic History  . Marital status: Married    Spouse name: Bradly Chris  . Number of children: 2  . Years of education: Not on file  . Highest education level: Not on file  Social Needs  . Financial resource strain: Not on file  . Food insecurity - worry: Not on file  . Food insecurity - inability: Not on file  . Transportation needs - medical: Not on file  . Transportation needs - non-medical: Not on file  Occupational History  . Occupation: Product manager: [employer]  Tobacco Use  . Smoking status: Never Smoker  . Smokeless tobacco: Never Used  . Tobacco comment: never used tobacco  Substance and Sexual Activity  . Alcohol use: No    Alcohol/week: 0.0 oz  . Drug use: No  . Sexual activity: Yes    Partners: Male  Other Topics  Concern  . Not on file  Social History Narrative   From [place]    2 children  --- 2006, 2012        Family History  Problem Relation Age of Onset  . Hypertension Maternal Grandmother   . Cancer Maternal Grandfather   . Heart disease Paternal Grandmother   . Stroke Paternal Grandfather   . Diabetes Neg Hx   . Colon cancer Neg Hx   . Breast cancer Neg Hx      Allergies as of 08/29/2017   No Known Allergies     Medication List        Accurate as of 08/29/17  3:20 PM. Always use your most recent med list.          cholecalciferol 1000 units tablet Commonly known as:  VITAMIN D Take 1,000 Units by mouth daily.   dasatinib 50 MG tablet Commonly known as:  SPRYCEL Take 1 tablet (50 mg total) by mouth daily.   fluticasone 0.05 % cream Commonly known as:  CUTIVATE APP EXT AA OF THE EYELID UP TO BID PRN   ketoconazole 2 % cream Commonly known as:  NIZORAL APP EXT AA OF THE BUTTOCK BID PRN   PREMARIN vaginal cream Generic drug:  conjugated estrogens   prenatal multivitamin Tabs tablet Take 1 tablet by mouth daily at 12  noon.   Vitamins/Minerals Tabs Take by mouth.          Objective:   Physical Exam BP 128/78 (BP Location: Left Arm, Patient Position: Sitting, Cuff Size: Normal)   Pulse 68   Temp 97.8 F (36.6 C) (Oral)   Resp 14   Ht 5\' 3"  (1.6 m)   Wt 219 lb 6 oz (99.5 kg)   SpO2 97%   BMI 38.86 kg/m  General:   Well developed, overweight appearing. NAD.  Neck: No  thyromegaly  HEENT:  Normocephalic . Face symmetric, atraumatic Lungs:  CTA B Normal respiratory effort, no intercostal retractions, no accessory muscle use. Heart: RRR,  no murmur.  No pretibial edema bilaterally  Abdomen:  Not distended, soft, non-tender. No rebound or rigidity.   GI:  Digital rectal exam: No mass Perianal skin: Slightly erythematous and moist.  No ulcers, no external hemorrhoids Anoscopy: Few internal hemorrhoids Skin:  Not pale. Not jaundice MSK: Slightly  TTP at the plantar aspect of the heels Neurologic:  alert & oriented X3.  Speech normal, gait appropriate for age and unassisted Strength symmetric and appropriate for age.  Psych: Cognition and judgment appear intact.  Cooperative with normal attention span and concentration.  +anxious > depressed appearing, tearful during parts of the visit      Assessment & Plan:   Assessment  Anxiety depression --- dx 2014 ADD dx 2015 , rx  meds 2015 , self d/c ~ 04-2015  CML ---  DX 2013 + PPD, s/p treatment, had a BCG Glaucoma suspect BTL Menopausal age 60   PLAN  Here for CPX.  We also discussed the following: CML: Saw hematology last month, felt to be doing well. Heel pain: Likely plantar fasciitis.  Recommend heel inserts, ice, stretching. Internal hemorrhoid, perianal irritation itching. Anusol HC suppositories for severe symptoms, Xylocaine 5% as needed, ketoconazole x 2 weeks.  Back to dermatology if not better. Depression: Counseled, will call if medication is needed. RTC  1 year   Today, I spent more than  20  min with the patient, addition to her physical exam, >50% of the time counseling regards depression, plantar fasciitis treatment, internal hemorrhoid treatment, I did some counseling as well.

## 2017-08-29 NOTE — Patient Instructions (Addendum)
GO TO THE FRONT DESK Back for fasting labs at your earliest convenience  Schedule your next appointment for a physical exam in 1 year  For hemorrhoids and irritation Ketoconazole twice a day for 2 weeks Xylocaine  as needed for itching For severe symptoms, use the supository twice a day as needed If you are not better, please see your dermatologist again  You have plantar fasciitis: Stretching twice a day Heel insert Ice at night   Consider see the bariatric clinic    Heel Spur A heel spur is a bony growth that forms on the bottom of your heel bone (calcaneus). Heel spurs are common and do not always cause pain. However, heel spurs often cause inflammation in the strong band of tissue that runs underneath the bone of your foot (plantar fascia). When this happens, you may feel pain on the bottom of your foot, near your heel. What are the causes? The cause of heel spurs is not completely understood. They may be caused by pressure on the heel. Or, they may stem from the muscle attachments (tendons) near the spur pulling on the heel. What increases the risk? You may be at risk for a heel spur if you:  Are older than 40.  Are overweight.  Have wear and tear arthritis (osteoarthritis).  Have plantar fascia inflammation.  What are the signs or symptoms? Some people have heel spurs but no symptoms. If you do have symptoms, they may include:  Pain in the bottom of your heel.  Pain that is worse when you first get out of bed.  Pain that gets worse after walking or standing.  How is this diagnosed? Your health care provider may diagnose a heel spur based on your symptoms and a physical exam. You may also have an X-ray of your foot to check for a bony growth coming from the calcaneus. How is this treated? Treatment aims to relieve the pain from the heel spur. This may include:  Stretching exercises.  Losing weight.  Wearing specific shoes, inserts, or orthotics for comfort  and support.  Wearing splints at night to properly position your feet.  Taking over-the-counter medicine to relieve pain.  Being treated with high-intensity sound waves to break up the heel spur (extracorporeal shock wave therapy).  Getting steroid injections in your heel to reduce swelling and ease pain.  Having surgery if your heel spur causes long-term (chronic) pain.  Follow these instructions at home:  Take medicines only as directed by your health care provider.  Ask your health care provider if you should use ice or cold packs on the painful areas of your heel or foot.  Avoid activities that cause you pain until you recover or as directed by your health care provider.  Stretch before exercising or being physically active.  Wear supportive shoes that fit well as directed by your health care provider. You might need to buy new shoes. Wearing old shoes or shoes that do not fit correctly may not provide the support that you need.  Lose weight if your health care provider thinks you should. This can relieve pressure on your foot that may be causing pain and discomfort. Contact a health care provider if:  Your pain continues or gets worse. This information is not intended to replace advice given to you by your health care provider. Make sure you discuss any questions you have with your health care provider. Document Released: 09/07/2005 Document Revised: 01/07/2016 Document Reviewed: 10/02/2013 Elsevier Interactive Patient Education  2018 [place].

## 2017-08-29 NOTE — Assessment & Plan Note (Addendum)
-  Td 2012 per pt; pnm 23 2017; prevnar 04-2016; flu shot today -[facility]yn[facility], [facility] 03-[facility]: had a PAP and uterine Bx ; also  MMG  per pt  -[place] Ao 05-2014 neg for AAA -Menopausal, age 47, rx DEXA at ~ age 65.  Recommend calcium vitamin D -Diet-exercise discussed, consider  bariatric clinic Labs: Cholesterol, A1c, TSH.

## 2017-08-30 ENCOUNTER — Other Ambulatory Visit (INDEPENDENT_AMBULATORY_CARE_PROVIDER_SITE_OTHER): Payer: BC Managed Care – PPO

## 2017-08-30 DIAGNOSIS — Z0000 Encounter for general adult medical examination without abnormal findings: Secondary | ICD-10-CM

## 2017-08-30 LAB — LIPID PANEL
CHOL/HDL RATIO: 2
Cholesterol: 187 mg/dL (ref 0–200)
HDL: 75.1 mg/dL (ref >39.00)
LDL Cholesterol: 102 mg/dL — ABNORMAL HIGH (ref 0–99)
NONHDL: 112.23
TRIGLYCERIDES: 50 mg/dL (ref 0.0–149.0)
VLDL: 10 mg/dL (ref 0.0–40.0)

## 2017-08-30 LAB — TSH: TSH: 4.01 u[IU]/mL (ref 0.35–4.50)

## 2017-08-30 LAB — HEMOGLOBIN A1C: HEMOGLOBIN A1C: 5.7 % (ref 4.6–6.5)

## 2017-08-30 NOTE — Assessment & Plan Note (Signed)
Here for CPX.  We also discussed the following: CML: Saw hematology last month, felt to be doing well. Heel pain: Likely plantar fasciitis.  Recommend heel inserts, ice, stretching. Internal hemorrhoid, perianal irritation itching. Anusol HC suppositories for severe symptoms, Xylocaine 5% as needed, ketoconazole x 2 weeks.  Back to dermatology if not better. Depression: Counseled, will call if medication is needed. RTC  1 year

## 2017-09-12 ENCOUNTER — Telehealth: Payer: Self-pay | Admitting: Pharmacist

## 2017-09-12 NOTE — Telephone Encounter (Signed)
Oral Chemotherapy Pharmacist Encounter  Follow-Up Form  Called patient today to follow up regarding patient's oral chemotherapy medication: Sprycel (dasatinib)  Original Start date of oral chemotherapy: 05/31/12   Pt reports 0 tablets/doses of Sprycel (dasatinib) missed since she restarted her medication. She is still taking her 70mg  tablets she stated she has about 15 days left. After finishing her 70mg  tablets she will change to the 50mg  tablets.   Pt reports the following side effects: N/A  Recent labs reviewed: CBC from 08/10/17  New medications?: no, new medications reported  Other Issues: N/A  Patient knows to call the office with questions or concerns. [facility] Clinic will continue to follow.  Darl Pikes, PharmD, BCPS Hematology/Oncology Clinical Pharmacist [facility] [phone]  09/12/2017 4:24 PM

## 2017-09-25 MED FILL — SPRYCEL 50 MG TABLET: 50 | 30 days supply | Qty: 30 | Fill #0

## 2017-10-12 ENCOUNTER — Inpatient Hospital Stay: Payer: BC Managed Care – PPO

## 2017-10-12 ENCOUNTER — Inpatient Hospital Stay: Payer: BC Managed Care – PPO | Attending: Hematology & Oncology | Admitting: Hematology & Oncology

## 2017-10-12 VITALS — BP 133/78 | HR 71 | Temp 97.9°F | Wt 216.5 lb

## 2017-10-12 DIAGNOSIS — C9210 Chronic myeloid leukemia, BCR/ABL-positive, not having achieved remission: Secondary | ICD-10-CM

## 2017-10-12 DIAGNOSIS — C9211 Chronic myeloid leukemia, BCR/ABL-positive, in remission: Secondary | ICD-10-CM | POA: Insufficient documentation

## 2017-10-12 LAB — CBC WITH DIFFERENTIAL (CANCER CENTER ONLY)
Basophils Absolute: 0 10*3/uL (ref 0.0–0.1)
Basophils Relative: 1 %
Eosinophils Absolute: 0.1 10*3/uL (ref 0.0–0.5)
Eosinophils Relative: 2 %
HEMATOCRIT: 36.8 % (ref 34.8–46.6)
HEMOGLOBIN: 11.9 g/dL (ref 11.6–15.9)
LYMPHS ABS: 1.4 10*3/uL (ref 0.9–3.3)
LYMPHS PCT: 39 %
MCH: 29.5 pg (ref 26.0–34.0)
MCHC: 32.3 g/dL (ref 32.0–36.0)
MCV: 91.1 fL (ref 81.0–101.0)
MONOS PCT: 5 %
Monocytes Absolute: 0.2 10*3/uL (ref 0.1–0.9)
NEUTROS ABS: 2 10*3/uL (ref 1.5–6.5)
NEUTROS PCT: 53 %
Platelet Count: 187 10*3/uL (ref 145–400)
RBC: 4.04 MIL/uL (ref 3.70–5.32)
RDW: 13.4 % (ref 11.1–15.7)
WBC: 3.7 10*3/uL — AB (ref 3.9–10.0)

## 2017-10-12 LAB — CMP (CANCER CENTER ONLY)
ALK PHOS: 67 U/L (ref 26–84)
ALT: 25 U/L (ref 10–47)
ANION GAP: 5 (ref 5–15)
AST: 24 U/L (ref 11–38)
Albumin: 3.7 g/dL (ref 3.5–5.0)
BILIRUBIN TOTAL: 0.6 mg/dL (ref 0.2–1.6)
BUN: 20 mg/dL (ref 7–22)
CHLORIDE: 104 mmol/L (ref 98–108)
CO2: 29 mmol/L (ref 18–33)
Calcium: 9.7 mg/dL (ref 8.0–10.3)
Creatinine: 0.7 mg/dL (ref 0.60–1.20)
Glucose, Bld: 118 mg/dL (ref 73–118)
POTASSIUM: 3.8 mmol/L (ref 3.3–4.7)
Sodium: 138 mmol/L (ref 128–145)
Total Protein: 7.3 g/dL (ref 6.4–8.1)

## 2017-10-12 NOTE — Progress Notes (Signed)
Hematology and Oncology Follow Up Visit  [name] [id number] 1971/02/24 47 y.o. 10/12/2017   Principle Diagnosis:  Chronic phase CML-molecular remission  Current Therapy:   Sprycel 50 mg by mouth daily   Interim History:  [name] is here today for follow-up.  She is doing pretty well.  She still has some of the arthralgias.  At this seemed to pop up mostly after she stops walking or exercising.  She and her family will be going up to [place] this weekend.  They will be going to the [place] consulate as her husband needs to get his passport renewed.  Her last BCR/ABL ratio was 0.0009%.  This would put her in a major molecular remission.  She is doing well otherwise.  She is teaching.  She is having no problems with fatigue or weakness.  Her weight is going up.  I told her that she really needs to watch out with her weight.  She is having no problems with cough.  There is no shortness of breath.  She has had no rashes.  She is had no leg swelling.  Overall, her performance status is ECOG 1.    Medications:  Allergies as of 10/12/2017   No Known Allergies     Medication List        Accurate as of 10/12/17 12:48 PM. Always use your most recent med list.          cholecalciferol 1000 units tablet Commonly known as:  VITAMIN D Take 1,000 Units by mouth daily.   dasatinib 50 MG tablet Commonly known as:  SPRYCEL Take 1 tablet (50 mg total) by mouth daily.   fluticasone 0.05 % cream Commonly known as:  CUTIVATE APP EXT AA OF THE EYELID UP TO BID PRN   hydrocortisone 25 MG suppository Commonly known as:  ANUSOL-HC Place 1 suppository (25 mg total) rectally 2 (two) times daily as needed for hemorrhoids.   ketoconazole 2 % cream Commonly known as:  NIZORAL Apply 1 application topically daily.   Lidocaine (Anorectal) 5 % Crea 3 times a day as needed for itching   PREMARIN vaginal cream Generic drug:  conjugated estrogens   prenatal multivitamin Tabs  tablet Take 1 tablet by mouth daily at 12 noon.   Vitamins/Minerals Tabs Take by mouth.       Allergies: No Known Allergies  Past Medical History, Surgical history, Social history, and Family History were reviewed and updated.  Review of Systems: Review of Systems  Constitutional: Negative.   HENT: Negative.   Eyes: Negative.   Respiratory: Negative.   Cardiovascular: Negative.   Gastrointestinal: Negative.   Genitourinary: Negative.   Musculoskeletal: Positive for myalgias.  Skin: Negative.   Neurological: Negative.   Endo/Heme/Allergies: Negative.   Psychiatric/Behavioral: Negative.     Physical Exam:  weight is 216 lb 8 oz (98.2 kg). Her oral temperature is 97.9 F (36.6 C). Her blood pressure is 133/78 and her pulse is 71. Her oxygen saturation is 99%.   Wt Readings from Last 3 Encounters:  10/12/17 216 lb 8 oz (98.2 kg)  08/29/17 219 lb 6 oz (99.5 kg)  08/10/17 213 lb (96.6 kg)    Physical Exam  Constitutional: She is oriented to person, place, and time.  HENT:  Head: Normocephalic and atraumatic.  Mouth/Throat: Oropharynx is clear and moist.  Eyes: EOM are normal. Pupils are equal, round, and reactive to light.  Neck: Normal range of motion.  Cardiovascular: Normal rate, regular rhythm and normal  heart sounds.  Pulmonary/Chest: Effort normal and breath sounds normal.  Abdominal: Soft. Bowel sounds are normal.  Musculoskeletal: Normal range of motion. She exhibits no edema, tenderness or deformity.  Lymphadenopathy:    She has no cervical adenopathy.  Neurological: She is alert and oriented to person, place, and time.  Skin: Skin is warm and dry. No rash noted. No erythema.  Psychiatric: She has a normal mood and affect. Her behavior is normal. Judgment and thought content normal.  Vitals reviewed.   Lab Results  Component Value Date   WBC 3.7 (L) 10/12/2017   HGB 12.2 08/10/2017   HCT 36.8 10/12/2017   MCV 91.1 10/12/2017   PLT 187 10/12/2017    Lab Results  Component Value Date   FERRITIN 158 12/30/2016   IRON 49 12/30/2016   TIBC 285 12/30/2016   UIBC 236 12/30/2016   IRONPCTSAT 17 (L) 12/30/2016   Lab Results  Component Value Date   RBC 4.04 10/12/2017   No results found for: KPAFRELGTCHN, LAMBDASER, KAPLAMBRATIO No results found for: IGGSERUM, IGA, IGMSERUM No results found for: Odetta Pink, SPEI   Chemistry      Component Value Date/Time   NA 138 10/12/2017 1135   NA 143 08/10/2017 1518   K 3.8 10/12/2017 1135   K 4.2 08/10/2017 1518   CL 104 10/12/2017 1135   CL 106 08/10/2017 1518   CO2 29 10/12/2017 1135   CO2 31 08/10/2017 1518   BUN 20 10/12/2017 1135   BUN 21 08/10/2017 1518   CREATININE 0.70 10/12/2017 1135   CREATININE 0.8 08/10/2017 1518      Component Value Date/Time   CALCIUM 9.7 10/12/2017 1135   CALCIUM 9.2 08/10/2017 1518   ALKPHOS 67 10/12/2017 1135   ALKPHOS 65 08/10/2017 1518   AST 24 10/12/2017 1135   ALT 25 10/12/2017 1135   ALT 24 08/10/2017 1518   BILITOT 0.6 10/12/2017 1135      Impression and Plan: [name] is a very pleasant 47 yo Hispanic female with chronic phase CML.   So far, she has been in a major molecular remission.  We will plan to get her back in 3 months.  I think this would be reasonable.  Again, she is done incredibly well.  I told her to keep exercising.  I worry about her weight gain leading to problems with respect to diabetes.   Volanda Napoleon, MD 2/28/201912:48 PM

## 2017-11-06 MED FILL — SPRYCEL 50 MG TABLET: 50 | 30 days supply | Qty: 30 | Fill #1

## 2017-11-10 LAB — BCR/ABL

## 2017-11-29 MED FILL — SPRYCEL 50 MG TABLET: 50 | 30 days supply | Qty: 30 | Fill #2

## 2017-12-04 ENCOUNTER — Telehealth: Payer: Self-pay | Admitting: Pharmacist

## 2017-12-04 NOTE — Telephone Encounter (Signed)
Oral Chemotherapy Pharmacist Encounter  Follow-Up Form  Called patient today to follow up regarding patient's oral chemotherapy medication: Spyrcel (dasatinib)  Original Start date of oral chemotherapy: 05/2012  Pt reports 0 tablets/doses of Sprycel missed in the last month.   Pt reports the following side effects: occasional muscle pain  Recent labs reviewed: BCR-ABL from 10/12/17  New medications?: none reported  Other Issues: none reported  Patient knows to call the office with questions or concerns. Oral Oncology Clinic will continue to follow.  Darl Pikes, PharmD, BCPS Hematology/Oncology Clinical Pharmacist [facility] [phone]  12/04/2017 4:13 PM

## 2017-12-26 MED FILL — SPRYCEL 50 MG TABLET: 50 | 30 days supply | Qty: 30 | Fill #3

## 2018-01-12 ENCOUNTER — Inpatient Hospital Stay: Payer: BC Managed Care – PPO

## 2018-01-12 ENCOUNTER — Other Ambulatory Visit: Payer: Self-pay

## 2018-01-12 ENCOUNTER — Other Ambulatory Visit: Payer: BC Managed Care – PPO

## 2018-01-12 ENCOUNTER — Ambulatory Visit: Payer: BC Managed Care – PPO | Admitting: Hematology & Oncology

## 2018-01-12 ENCOUNTER — Encounter: Payer: Self-pay | Admitting: Hematology & Oncology

## 2018-01-12 ENCOUNTER — Inpatient Hospital Stay: Payer: BC Managed Care – PPO | Attending: Hematology & Oncology | Admitting: Hematology & Oncology

## 2018-01-12 VITALS — BP 117/66 | HR 57 | Temp 98.3°F | Resp 16 | Wt 209.0 lb

## 2018-01-12 DIAGNOSIS — C9211 Chronic myeloid leukemia, BCR/ABL-positive, in remission: Secondary | ICD-10-CM | POA: Insufficient documentation

## 2018-01-12 DIAGNOSIS — C9210 Chronic myeloid leukemia, BCR/ABL-positive, not having achieved remission: Secondary | ICD-10-CM

## 2018-01-12 LAB — CBC WITH DIFFERENTIAL (CANCER CENTER ONLY)
Basophils Absolute: 0 10*3/uL (ref 0.0–0.1)
Basophils Relative: 1 %
EOS ABS: 0.1 10*3/uL (ref 0.0–0.5)
EOS PCT: 2 %
HCT: 35.9 % (ref 34.8–46.6)
HEMOGLOBIN: 12 g/dL (ref 11.6–15.9)
LYMPHS ABS: 1.8 10*3/uL (ref 0.9–3.3)
LYMPHS PCT: 46 %
MCH: 30.7 pg (ref 26.0–34.0)
MCHC: 33.4 g/dL (ref 32.0–36.0)
MCV: 91.8 fL (ref 81.0–101.0)
Monocytes Absolute: 0.2 10*3/uL (ref 0.1–0.9)
Monocytes Relative: 5 %
Neutro Abs: 1.8 10*3/uL (ref 1.5–6.5)
Neutrophils Relative %: 46 %
PLATELETS: 195 10*3/uL (ref 145–400)
RBC: 3.91 MIL/uL (ref 3.70–5.32)
RDW: 13.2 % (ref 11.1–15.7)
WBC: 3.8 10*3/uL — AB (ref 3.9–10.0)

## 2018-01-12 LAB — CMP (CANCER CENTER ONLY)
ALK PHOS: 60 U/L (ref 40–150)
ALT: 19 U/L (ref 0–55)
AST: 22 U/L (ref 5–34)
Albumin: 4.4 g/dL (ref 3.5–5.0)
Anion gap: 10 (ref 3–11)
BUN: 16 mg/dL (ref 7–26)
CALCIUM: 9.5 mg/dL (ref 8.4–10.4)
CHLORIDE: 103 mmol/L (ref 98–109)
CO2: 27 mmol/L (ref 22–29)
CREATININE: 0.9 mg/dL (ref 0.60–1.10)
GFR, Est AFR Am: >60 mL/min (ref >60)
GFR, Estimated: >60 mL/min (ref >60)
GLUCOSE: 88 mg/dL (ref 70–140)
Potassium: 4.2 mmol/L (ref 3.5–5.1)
SODIUM: 140 mmol/L (ref 136–145)
Total Bilirubin: 0.2 mg/dL (ref 0.2–1.2)
Total Protein: 7.5 g/dL (ref 6.4–8.3)

## 2018-01-12 LAB — LACTATE DEHYDROGENASE: LDH: 115 U/L — ABNORMAL LOW (ref 125–245)

## 2018-01-12 LAB — TECHNOLOGIST SMEAR REVIEW

## 2018-01-12 NOTE — Progress Notes (Signed)
Hematology and Oncology Follow Up Visit  [name] [id number] 10-13-1970 47 y.o. 01/12/2018   Principle Diagnosis:  Chronic phase CML-molecular remission  Current Therapy:   Sprycel 50 mg by mouth daily   Interim History:  [name] is here today for follow-up.  She is doing pretty well.  She is still in a major molecular remission.  Her BCR/ABL ratio was 0.019%.  She is going to go down to [place] for the summer.  She will be leaving in about a month.  She is had no problems with increased bony pain.  She has had no nausea or vomiting.  She has had no change in bowel or bladder habits.  She is had no leg swelling.  She has had no rashes.  She does not have her monthly cycles any longer.  Overall, her performance status is ECOG 1.    Medications:  Allergies as of 01/12/2018   No Known Allergies     Medication List        Accurate as of 01/12/18  1:06 PM. Always use your most recent med list.          cholecalciferol 1000 units tablet Commonly known as:  VITAMIN D Take 1,000 Units by mouth daily.   dasatinib 50 MG tablet Commonly known as:  SPRYCEL Take 1 tablet (50 mg total) by mouth daily.   fluticasone 0.05 % cream Commonly known as:  CUTIVATE APP EXT AA OF THE EYELID UP TO BID PRN   hydrocortisone 25 MG suppository Commonly known as:  ANUSOL-HC Place 1 suppository (25 mg total) rectally 2 (two) times daily as needed for hemorrhoids.   ketoconazole 2 % cream Commonly known as:  NIZORAL Apply 1 application topically daily.   Lidocaine (Anorectal) 5 % Crea 3 times a day as needed for itching   PREMARIN vaginal cream Generic drug:  conjugated estrogens   prenatal multivitamin Tabs tablet Take 1 tablet by mouth daily at 12 noon.   Vitamins/Minerals Tabs Take by mouth.       Allergies: No Known Allergies  Past Medical History, Surgical history, Social history, and Family History were reviewed and updated.  Review of Systems: Review of  Systems  Constitutional: Negative.   HENT: Negative.   Eyes: Negative.   Respiratory: Negative.   Cardiovascular: Negative.   Gastrointestinal: Negative.   Genitourinary: Negative.   Musculoskeletal: Positive for myalgias.  Skin: Negative.   Neurological: Negative.   Endo/Heme/Allergies: Negative.   Psychiatric/Behavioral: Negative.     Physical Exam:  weight is 209 lb (94.8 kg). Her oral temperature is 98.3 F (36.8 C). Her blood pressure is 117/66 and her pulse is 57 (abnormal). Her respiration is 16 and oxygen saturation is 100%.   Wt Readings from Last 3 Encounters:  01/12/18 209 lb (94.8 kg)  10/12/17 216 lb 8 oz (98.2 kg)  08/29/17 219 lb 6 oz (99.5 kg)    Physical Exam  Constitutional: She is oriented to person, place, and time.  HENT:  Head: Normocephalic and atraumatic.  Mouth/Throat: Oropharynx is clear and moist.  Eyes: Pupils are equal, round, and reactive to light. EOM are normal.  Neck: Normal range of motion.  Cardiovascular: Normal rate, regular rhythm and normal heart sounds.  Pulmonary/Chest: Effort normal and breath sounds normal.  Abdominal: Soft. Bowel sounds are normal.  Musculoskeletal: Normal range of motion. She exhibits no edema, tenderness or deformity.  Lymphadenopathy:    She has no cervical adenopathy.  Neurological: She is alert and oriented  to person, place, and time.  Skin: Skin is warm and dry. No rash noted. No erythema.  Psychiatric: She has a normal mood and affect. Her behavior is normal. Judgment and thought content normal.  Vitals reviewed.   Lab Results  Component Value Date   WBC 3.8 (L) 01/12/2018   HGB 12.0 01/12/2018   HCT 35.9 01/12/2018   MCV 91.8 01/12/2018   PLT 195 01/12/2018   Lab Results  Component Value Date   FERRITIN 158 12/30/2016   IRON 49 12/30/2016   TIBC 285 12/30/2016   UIBC 236 12/30/2016   IRONPCTSAT 17 (L) 12/30/2016   Lab Results  Component Value Date   RBC 3.91 01/12/2018   No results  found for: KPAFRELGTCHN, LAMBDASER, KAPLAMBRATIO No results found for: IGGSERUM, IGA, IGMSERUM No results found for: Odetta Pink, SPEI   Chemistry      Component Value Date/Time   NA 138 10/12/2017 1135   NA 143 08/10/2017 1518   K 3.8 10/12/2017 1135   K 4.2 08/10/2017 1518   CL 104 10/12/2017 1135   CL 106 08/10/2017 1518   CO2 29 10/12/2017 1135   CO2 31 08/10/2017 1518   BUN 20 10/12/2017 1135   BUN 21 08/10/2017 1518   CREATININE 0.70 10/12/2017 1135   CREATININE 0.8 08/10/2017 1518      Component Value Date/Time   CALCIUM 9.7 10/12/2017 1135   CALCIUM 9.2 08/10/2017 1518   ALKPHOS 67 10/12/2017 1135   ALKPHOS 65 08/10/2017 1518   AST 24 10/12/2017 1135   ALT 25 10/12/2017 1135   ALT 24 08/10/2017 1518   BILITOT 0.6 10/12/2017 1135      Impression and Plan: [name] is a very pleasant 47 yo Hispanic female with chronic phase CML.   So far, she has been in a major molecular remission.  We will plan to get her back in 4 months.  I think this would be reasonable.  Again, she is done incredibly well.  I told her to keep exercising.   Volanda Napoleon, MD 5/31/20191:06 PM

## 2018-01-22 MED FILL — SPRYCEL 50 MG TABLET: 50 | 30 days supply | Qty: 30 | Fill #4

## 2018-01-31 ENCOUNTER — Ambulatory Visit: Payer: BC Managed Care – PPO | Admitting: Hematology & Oncology

## 2018-01-31 ENCOUNTER — Other Ambulatory Visit: Payer: BC Managed Care – PPO

## 2018-02-02 LAB — BCR/ABL

## 2018-02-27 ENCOUNTER — Telehealth: Payer: Self-pay | Admitting: Pharmacist

## 2018-02-27 NOTE — Telephone Encounter (Signed)
Oral Chemotherapy Pharmacist Encounter   Attempted to reach patient for follow up on oral medication: Sprycel (dasatinib). No answer. Left VM for patient to call back.    Darl Pikes, PharmD, BCPS, [facility] Hematology/Oncology Clinical Pharmacist [facility] [phone]  02/27/2018 4:00 PM

## 2018-03-07 ENCOUNTER — Telehealth: Payer: Self-pay | Admitting: Pharmacist

## 2018-03-07 NOTE — Telephone Encounter (Signed)
Oral Chemotherapy Pharmacist Encounter  Follow-Up Form  Called patient today to follow up regarding patient's oral chemotherapy medication: Sprycel (dasatinib)  Original Start date of oral chemotherapy: 05/2012  Pt reports 0 tablets/doses of dasatinib missed so far this month.   Pt reports the following side effects: None reported  Recent labs reviewed: BCR/ABL from 01/12/18  New medications?: None reported  Other Issues: None reported  Patient knows to call the office with questions or concerns. Oral Oncology Clinic will continue to follow.  Darl Pikes, PharmD, BCPS, [facility] Hematology/Oncology Clinical Pharmacist [facility] [phone]  03/07/2018 9:25 AM

## 2018-03-08 MED FILL — SPRYCEL 50 MG TABLET: 50 | 30 days supply | Qty: 30 | Fill #5

## 2018-04-05 MED FILL — SPRYCEL 50 MG TABLET: 50 | 30 days supply | Qty: 30 | Fill #6

## 2018-05-02 MED FILL — SPRYCEL 50 MG TABLET: 50 | 30 days supply | Qty: 30 | Fill #7

## 2018-05-14 ENCOUNTER — Other Ambulatory Visit: Payer: Self-pay

## 2018-05-14 ENCOUNTER — Inpatient Hospital Stay: Payer: BC Managed Care – PPO

## 2018-05-14 ENCOUNTER — Encounter: Payer: Self-pay | Admitting: Hematology & Oncology

## 2018-05-14 ENCOUNTER — Inpatient Hospital Stay: Payer: BC Managed Care – PPO | Attending: Hematology & Oncology | Admitting: Hematology & Oncology

## 2018-05-14 VITALS — BP 113/67 | HR 67 | Temp 98.0°F | Resp 17 | Wt 209.0 lb

## 2018-05-14 DIAGNOSIS — C9211 Chronic myeloid leukemia, BCR/ABL-positive, in remission: Secondary | ICD-10-CM

## 2018-05-14 DIAGNOSIS — C9210 Chronic myeloid leukemia, BCR/ABL-positive, not having achieved remission: Secondary | ICD-10-CM

## 2018-05-14 LAB — CBC WITH DIFFERENTIAL (CANCER CENTER ONLY)
BASOS ABS: 0 10*3/uL (ref 0.0–0.1)
BASOS PCT: 1 %
Eosinophils Absolute: 0 10*3/uL (ref 0.0–0.5)
Eosinophils Relative: 1 %
HEMATOCRIT: 35.9 % (ref 34.8–46.6)
Hemoglobin: 11.8 g/dL (ref 11.6–15.9)
Lymphocytes Relative: 39 %
Lymphs Abs: 1.5 10*3/uL (ref 0.9–3.3)
MCH: 29.6 pg (ref 26.0–34.0)
MCHC: 32.9 g/dL (ref 32.0–36.0)
MCV: 90.2 fL (ref 81.0–101.0)
MONO ABS: 0.2 10*3/uL (ref 0.1–0.9)
Monocytes Relative: 6 %
NEUTROS ABS: 2.1 10*3/uL (ref 1.5–6.5)
Neutrophils Relative %: 53 %
PLATELETS: 182 10*3/uL (ref 145–400)
RBC: 3.98 MIL/uL (ref 3.70–5.32)
RDW: 12.8 % (ref 11.1–15.7)
WBC Count: 3.8 10*3/uL — ABNORMAL LOW (ref 3.9–10.0)

## 2018-05-14 LAB — CMP (CANCER CENTER ONLY)
ALBUMIN: 4 g/dL (ref 3.5–5.0)
ALT: 21 U/L (ref 10–47)
AST: 26 U/L (ref 11–38)
Alkaline Phosphatase: 64 U/L (ref 26–84)
Anion gap: 1 — ABNORMAL LOW (ref 5–15)
BILIRUBIN TOTAL: 0.6 mg/dL (ref 0.2–1.6)
BUN: 14 mg/dL (ref 7–22)
CALCIUM: 9.7 mg/dL (ref 8.0–10.3)
CO2: 29 mmol/L (ref 18–33)
CREATININE: 1.3 mg/dL — AB (ref 0.60–1.20)
Chloride: 109 mmol/L — ABNORMAL HIGH (ref 98–108)
GLUCOSE: 98 mg/dL (ref 73–118)
Potassium: 4.3 mmol/L (ref 3.3–4.7)
Sodium: 139 mmol/L (ref 128–145)
Total Protein: 7.3 g/dL (ref 6.4–8.1)

## 2018-05-14 NOTE — Progress Notes (Signed)
Hematology and Oncology Follow Up Visit  [name] [id number] [birth year] 47 y.o. 05/14/2018   Principle Diagnosis:  Chronic phase CML-molecular remission  Current Therapy:   Sprycel 50 mg by mouth daily   Interim History:  [name] is here today for follow-up.  She is doing pretty well.  She is still in a major molecular remission.  Her BCR/ABL ratio was 0.001%.  I am so happy for her.  She had a wonderful time down in [place].  She had back in August.  Even more important is the fact that she is now a [place] citizen.  She passed the citizenship test.  I am so happy for her.  Both she and her husband are now official [place] citizens.  Her kids are doing well.  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  She is had no problems with nausea or vomiting.  She is had no issues with cough.  She is had no change in bowel or bladder habits.  Overall, I would say that her performance status is ECOG 0.   Medications:  Allergies as of 05/14/2018   No Known Allergies     Medication List        Accurate as of 05/14/18  3:58 PM. Always use your most recent med list.          cholecalciferol 1000 units tablet Commonly known as:  VITAMIN D Take 1,000 Units by mouth daily.   dasatinib 50 MG tablet Commonly known as:  SPRYCEL Take 1 tablet (50 mg total) by mouth daily.   fluticasone 0.05 % cream Commonly known as:  CUTIVATE APP EXT AA OF THE EYELID UP TO BID PRN   hydrocortisone 25 MG suppository Commonly known as:  ANUSOL-HC Place 1 suppository (25 mg total) rectally 2 (two) times daily as needed for hemorrhoids.   ketoconazole 2 % cream Commonly known as:  NIZORAL Apply 1 application topically daily.   Lidocaine (Anorectal) 5 % Crea 3 times a day as needed for itching   PREMARIN vaginal cream Generic drug:  conjugated estrogens   prenatal multivitamin Tabs tablet Take 1 tablet by mouth daily at 12 noon.   Vitamins/Minerals Tabs Take by  mouth.       Allergies: No Known Allergies  Past Medical History, Surgical history, Social history, and Family History were reviewed and updated.  Review of Systems: Review of Systems  Constitutional: Negative.   HENT: Negative.   Eyes: Negative.   Respiratory: Negative.   Cardiovascular: Negative.   Gastrointestinal: Negative.   Genitourinary: Negative.   Musculoskeletal: Positive for myalgias.  Skin: Negative.   Neurological: Negative.   Endo/Heme/Allergies: Negative.   Psychiatric/Behavioral: Negative.     Physical Exam:  weight is 209 lb (94.8 kg). Her oral temperature is 98 F (36.7 C). Her blood pressure is 113/67 and her pulse is 67. Her respiration is 17 and oxygen saturation is 100%.   Wt Readings from Last 3 Encounters:  05/14/18 209 lb (94.8 kg)  01/12/18 209 lb (94.8 kg)  10/12/17 216 lb 8 oz (98.2 kg)    Physical Exam  Constitutional: She is oriented to person, place, and time.  HENT:  Head: Normocephalic and atraumatic.  Mouth/Throat: Oropharynx is clear and moist.  Eyes: Pupils are equal, round, and reactive to light. EOM are normal.  Neck: Normal range of motion.  Cardiovascular: Normal rate, regular rhythm and normal heart sounds.  Pulmonary/Chest: Effort normal and breath sounds  normal.  Abdominal: Soft. Bowel sounds are normal.  Musculoskeletal: Normal range of motion. She exhibits no edema, tenderness or deformity.  Lymphadenopathy:    She has no cervical adenopathy.  Neurological: She is alert and oriented to person, place, and time.  Skin: Skin is warm and dry. No rash noted. No erythema.  Psychiatric: She has a normal mood and affect. Her behavior is normal. Judgment and thought content normal.  Vitals reviewed.   Lab Results  Component Value Date   WBC 3.8 (L) 05/14/2018   HGB 11.8 05/14/2018   HCT 35.9 05/14/2018   MCV 90.2 05/14/2018   PLT 182 05/14/2018   Lab Results  Component Value Date   FERRITIN 158 12/30/2016   IRON 49  12/30/2016   TIBC 285 12/30/2016   UIBC 236 12/30/2016   IRONPCTSAT 17 (L) 12/30/2016   Lab Results  Component Value Date   RBC 3.98 05/14/2018   No results found for: KPAFRELGTCHN, LAMBDASER, KAPLAMBRATIO No results found for: IGGSERUM, IGA, IGMSERUM No results found for: Odetta Pink, SPEI   Chemistry      Component Value Date/Time   NA 139 05/14/2018 1513   NA 143 08/10/2017 1518   K 4.3 05/14/2018 1513   K 4.2 08/10/2017 1518   CL 109 (H) 05/14/2018 1513   CL 106 08/10/2017 1518   CO2 29 05/14/2018 1513   CO2 31 08/10/2017 1518   BUN 14 05/14/2018 1513   BUN 21 08/10/2017 1518   CREATININE 1.30 (H) 05/14/2018 1513   CREATININE 0.8 08/10/2017 1518      Component Value Date/Time   CALCIUM 9.7 05/14/2018 1513   CALCIUM 9.2 08/10/2017 1518   ALKPHOS 64 05/14/2018 1513   ALKPHOS 65 08/10/2017 1518   AST 26 05/14/2018 1513   ALT 21 05/14/2018 1513   ALT 24 08/10/2017 1518   BILITOT 0.6 05/14/2018 1513      Impression and Plan: [name] is a very pleasant 47 yo Hispanic female with chronic phase CML.   So far, she has been in a major molecular remission.  We will plan to get her back in 6 months now.  I really feel confident that she is going to do well.  She got had to stay on Sprycel since we got her off back in 2017 and she relapsed fairly quickly.  I am very humbled by the fact that she probably back a stamp from [place].     Volanda Napoleon, MD 9/30/20193:58 PM

## 2018-05-15 LAB — LACTATE DEHYDROGENASE: LDH: 111 U/L (ref 98–192)

## 2018-05-24 LAB — BCR/ABL

## 2018-05-31 MED FILL — SPRYCEL 50 MG TABLET: 50 | 30 days supply | Qty: 30 | Fill #8

## 2018-06-05 ENCOUNTER — Encounter: Payer: Self-pay | Admitting: Internal Medicine

## 2018-06-08 ENCOUNTER — Ambulatory Visit: Payer: BC Managed Care – PPO | Admitting: Internal Medicine

## 2018-06-08 ENCOUNTER — Encounter: Payer: Self-pay | Admitting: Internal Medicine

## 2018-06-08 VITALS — BP 108/60 | HR 73 | Temp 98.1°F | Resp 16 | Ht 63.0 in | Wt 207.0 lb

## 2018-06-08 DIAGNOSIS — L309 Dermatitis, unspecified: Secondary | ICD-10-CM

## 2018-06-08 MED ORDER — NYSTATIN-TRIAMCINOLONE 100000-0.1 UNIT/GM-% EX OINT: 1.0000 "application " | TOPICAL_OINTMENT | Freq: Two times a day (BID) | CUTANEOUS | 0 refills | Status: DC

## 2018-06-08 MED ORDER — LIDOCAINE (ANORECTAL) 5 % EX CREA: TOPICAL_CREAM | CUTANEOUS | 2 refills | Status: DC

## 2018-06-08 NOTE — Patient Instructions (Signed)
Apply Mycolog twice a day for 10 days to the perianal area  Use lidocaine as needed for itching at the perianal area  Keep the area dry with powder  See your dermatologist

## 2018-06-08 NOTE — Progress Notes (Signed)
Subjective:    Patient ID: [name], female    DOB: 27-Nov-1970, 47 y.o.   MRN: [id number]  DOS:  06/08/2018 Type of visit - description : acute Interval history: Continue with perianal irritation in the skin, using fluticasone with temporary relief but after she uses it it gets even more irritated. History of hemorrhoids, currently causing no pain or discomfort. From time to time when the skin is really irritated she sees a small amounts of blood in the toilet paper Denies any vaginal discharge or bleeding   Review of Systems   Past Medical History:  Diagnosis Date  . Anxiety and depression 04/02/2013  . CML (chronic myelocytic leukemia) ([place]) 2013  . Glaucoma suspect   . 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 CESAREAN SECTION  2006 & 2012  . TUBAL LIGATION      Social History   Socioeconomic History  . Marital status: Married    Spouse name: Bradly Chris  . Number of children: 2  . Years of education: Not on file  . Highest education level: Not on file  Occupational History  . Occupation: Product manager: [employer]  Social Needs  . Financial resource strain: Not on file  . Food insecurity:    Worry: Not on file    Inability: Not on file  . Transportation needs:    Medical: Not on file    Non-medical: Not on file  Tobacco Use  . Smoking status: Never Smoker  . Smokeless tobacco: Never Used  . Tobacco comment: never used tobacco  Substance and Sexual Activity  . Alcohol use: No    Alcohol/week: 0.0 standard drinks  . Drug use: No  . Sexual activity: Yes    Partners: Male  Lifestyle  . Physical activity:    Days per week: Not on file    Minutes per session: Not on file  . Stress: Not on file  Relationships  . Social connections:    Talks on phone: Not on file    Gets together: Not on file    Attends religious service: Not on file    Active member of club or  organization: Not on file    Attends meetings of clubs or organizations: Not on file    Relationship status: Not on file  . Intimate partner violence:    Fear of current or ex partner: Not on file    Emotionally abused: Not on file    Physically abused: Not on file    Forced sexual activity: Not on file  Other Topics Concern  . Not on file  Social History Narrative   From [place]    2 children  --- 2006, 2012         Allergies as of 06/08/2018   No Known Allergies     Medication List        Accurate as of 06/08/18 11:59 PM. Always use your most recent med list.          cholecalciferol 1000 units tablet Commonly known as:  VITAMIN D Take 1,000 Units by mouth daily.   dasatinib 50 MG tablet Commonly known as:  SPRYCEL Take 1 tablet (50 mg total) by mouth daily.   fluticasone 0.05 % cream Commonly known as:  CUTIVATE APP EXT AA OF THE EYELID UP TO BID PRN   Lidocaine (Anorectal) 5 % Crea  3 times a day as needed for itching   nystatin-triamcinolone ointment Commonly known as:  MYCOLOG Apply 1 application topically 2 (two) times daily.   PREMARIN vaginal cream Generic drug:  conjugated estrogens   prenatal multivitamin Tabs tablet Take 1 tablet by mouth daily at 12 noon.   Vitamins/Minerals Tabs Take by mouth.          Objective:   Physical Exam BP 108/60 (BP Location: Left Arm, Patient Position: Sitting, Cuff Size: Large)   Pulse 73   Temp 98.1 F (36.7 C) (Oral)   Resp 16   Ht 5\' 3"  (1.6 m)   Wt 207 lb (93.9 kg)   SpO2 98%   BMI 36.67 kg/m  General:   Well developed, NAD, see BMI.  HEENT:  Normocephalic . Face symmetric, atraumatic GU: External genitalia normal with no discharge. Perianal skin: Some maceration and redness.  Has a skin tag. Neurologic:  alert & oriented X3.  Speech normal, gait appropriate for age and unassisted Psych--  Cognition and judgment appear intact.  Cooperative with normal attention span and concentration.    Behavior appropriate. No anxious or depressed appearing.      Assessment & Plan:   Assessment  Anxiety depression --- dx 2014 ADD dx 2015 , rx  meds 2015 , self d/c ~ 04-2015  CML ---  DX 2013 + PPD, s/p treatment, had a BCG Glaucoma suspect BTL Menopausal age 53   PLAN  Perianal dermatitis: Ongoing problem, ketoconazole did not help. Rec mycolog, once better keep area dry w/ powder, if no better, see derm again. Lidocaine for itching control, use prn

## 2018-06-10 NOTE — Assessment & Plan Note (Signed)
Perianal dermatitis: Ongoing problem, ketoconazole did not help. Rec mycolog, once better keep area dry w/ powder, if no better, see derm again. Lidocaine for itching control, use prn

## 2018-07-05 MED FILL — SPRYCEL 50 MG TABLET: 50 | 30 days supply | Qty: 30 | Fill #9

## 2018-07-20 ENCOUNTER — Ambulatory Visit: Payer: BC Managed Care – PPO | Admitting: Family Medicine

## 2018-07-20 NOTE — Progress Notes (Deleted)
OFFICE VISIT  07/20/2018   CC: No chief complaint on file.    HPI:    Patient is a 47 y.o. Caucasian female patient of Dr. Larose Kells who presents for cough. Of note, she has chronic phase CML and is followed by Dr. Marin Olp, most recently 05/14/18. She takes sprycel daily/chronically.  Past Medical History:  Diagnosis Date  . Anxiety and depression 04/02/2013  . CML (chronic myelocytic leukemia) ([place]) 2013  . Glaucoma suspect   . 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 CESAREAN SECTION  2006 & 2012  . TUBAL LIGATION      Outpatient Medications Prior to Visit  Medication Sig Dispense Refill  . cholecalciferol (VITAMIN D) 1000 units tablet Take 1,000 Units by mouth daily.    . dasatinib (SPRYCEL) 50 MG tablet Take 1 tablet (50 mg total) by mouth daily. 30 tablet 12  . fluticasone (CUTIVATE) 0.05 % cream APP EXT AA OF THE EYELID UP TO BID PRN  3  . Lidocaine, Anorectal, 5 % CREA 3 times a day as needed for itching 45 g 2  . nystatin-triamcinolone ointment (MYCOLOG) Apply 1 application topically 2 (two) times daily. 30 g 0  . PREMARIN vaginal cream     . Prenatal Vit-Fe Fumarate-FA (PRENATAL MULTIVITAMIN) TABS tablet Take 1 tablet by mouth daily at 12 noon.    . Vitamins/Minerals TABS Take by mouth.     No facility-administered medications prior to visit.     No Known Allergies  ROS As per HPI  PE: There were no vitals taken for this visit. ***  LABS:    Chemistry      Component Value Date/Time   NA 139 05/14/2018 1513   NA 143 08/10/2017 1518   K 4.3 05/14/2018 1513   K 4.2 08/10/2017 1518   CL 109 (H) 05/14/2018 1513   CL 106 08/10/2017 1518   CO2 29 05/14/2018 1513   CO2 31 08/10/2017 1518   BUN 14 05/14/2018 1513   BUN 21 08/10/2017 1518   CREATININE 1.30 (H) 05/14/2018 1513   CREATININE 0.8 08/10/2017 1518      Component Value Date/Time   CALCIUM 9.7 05/14/2018 1513   CALCIUM 9.2  08/10/2017 1518   ALKPHOS 64 05/14/2018 1513   ALKPHOS 65 08/10/2017 1518   AST 26 05/14/2018 1513   ALT 21 05/14/2018 1513   ALT 24 08/10/2017 1518   BILITOT 0.6 05/14/2018 1513     Lab Results  Component Value Date   WBC 3.8 (L) 05/14/2018   HGB 11.8 05/14/2018   HCT 35.9 05/14/2018   MCV 90.2 05/14/2018   PLT 182 05/14/2018    IMPRESSION AND PLAN:  No problem-specific Assessment & Plan notes found for this encounter.   An After Visit Summary was printed and given to the patient.  FOLLOW UP: No follow-ups on file.

## 2018-07-28 ENCOUNTER — Ambulatory Visit: Payer: BC Managed Care – PPO | Admitting: Family Medicine

## 2018-08-02 MED FILL — SPRYCEL 50 MG TABLET: 50 | 30 days supply | Qty: 30 | Fill #10

## 2018-08-10 LAB — HM MAMMOGRAPHY

## 2018-08-27 ENCOUNTER — Other Ambulatory Visit: Payer: Self-pay | Admitting: Hematology & Oncology

## 2018-09-07 MED FILL — SPRYCEL 50 MG TABLET: 50 | 30 days supply | Qty: 30 | Fill #0

## 2018-09-13 ENCOUNTER — Encounter: Payer: Self-pay | Admitting: Internal Medicine

## 2018-09-13 ENCOUNTER — Ambulatory Visit (INDEPENDENT_AMBULATORY_CARE_PROVIDER_SITE_OTHER): Payer: BC Managed Care – PPO | Admitting: Internal Medicine

## 2018-09-13 VITALS — BP 116/64 | HR 60 | Temp 97.8°F | Resp 16 | Ht 63.0 in | Wt 202.4 lb

## 2018-09-13 DIAGNOSIS — G4452 New daily persistent headache (NDPH): Secondary | ICD-10-CM | POA: Diagnosis not present

## 2018-09-13 DIAGNOSIS — Z0000 Encounter for general adult medical examination without abnormal findings: Secondary | ICD-10-CM

## 2018-09-13 DIAGNOSIS — Z23 Encounter for immunization: Secondary | ICD-10-CM | POA: Diagnosis not present

## 2018-09-13 LAB — LIPID PANEL
CHOLESTEROL: 191 mg/dL (ref 0–200)
HDL: 65.6 mg/dL (ref >39.00)
LDL Cholesterol: 112 mg/dL — ABNORMAL HIGH (ref 0–99)
NonHDL: 125.71
Total CHOL/HDL Ratio: 3
Triglycerides: 69 mg/dL (ref 0.0–149.0)
VLDL: 13.8 mg/dL (ref 0.0–40.0)

## 2018-09-13 LAB — BASIC METABOLIC PANEL
BUN: 19 mg/dL (ref 6–23)
CO2: 29 mEq/L (ref 19–32)
Calcium: 9.4 mg/dL (ref 8.4–10.5)
Chloride: 103 mEq/L (ref 96–112)
Creatinine, Ser: 0.73 mg/dL (ref 0.40–1.20)
GFR: 84.64 mL/min (ref >60.00)
Glucose, Bld: 87 mg/dL (ref 70–99)
POTASSIUM: 4.6 meq/L (ref 3.5–5.1)
Sodium: 138 mEq/L (ref 135–145)

## 2018-09-13 LAB — TSH: TSH: 3.58 u[IU]/mL (ref 0.35–4.50)

## 2018-09-13 LAB — SEDIMENTATION RATE: SED RATE: 15 mm/h (ref 0–20)

## 2018-09-13 LAB — T3, FREE: T3, Free: 3.4 pg/mL (ref 2.3–4.2)

## 2018-09-13 LAB — T4, FREE: Free T4: 0.89 ng/dL (ref 0.60–1.60)

## 2018-09-13 NOTE — Assessment & Plan Note (Signed)
-  Td 08/2018; pnm 23 2017; prevnar 04-2016; flu shot today -[facility] care [facility], [facility],  03-2017: had a PAP and uterine Bx; last OV 08-10-2018, had a MMG -[place] Ao 05-2014 neg for AAA -Menopausal, age 48, rx DEXA at ~ age 72.  Recommend calcium vitamin D -Diet-exercise discussed,  - labs: BMP, FLP, sed rate, TFTs

## 2018-09-13 NOTE — Progress Notes (Signed)
Subjective:    Patient ID: [name], female    DOB: [birth year], 48 y.o.   MRN: [id number]  DOS:  09/13/2018 Type of visit - description: Physical exam She has some concerns, see review of systems    Review of Systems Occasionally sweats at night, this is going on for at least 1 year.  No actual fever, chills or rigors . Perianal dermatitis: Slightly better. She has developed a new type of headache different to her regular headaches. Started about a year ago, he has 2-3 episodes a month, they last approximately 2 days, ibuprofen does help. Located at the forehead, no associated nausea, vomiting, tearing or visual disturbances. When asked, she reports that they are indeed the worst of her life. No neck stiffness.  Other than above, a 14 point review of systems is negative     Past Medical History:  Diagnosis Date  . Anxiety and depression 04/02/2013  . CML (chronic myelocytic leukemia) ([place]) 2013  . Glaucoma suspect   . 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 CESAREAN SECTION  2006 & 2012  . TUBAL LIGATION      Social History   Socioeconomic History  . Marital status: Married    Spouse name: Bradly Chris  . Number of children: 2  . Years of education: Not on file  . Highest education level: Not on file  Occupational History  . Occupation: Product manager: [employer]  Social Needs  . Financial resource strain: Not on file  . Food insecurity:    Worry: Not on file    Inability: Not on file  . Transportation needs:    Medical: Not on file    Non-medical: Not on file  Tobacco Use  . Smoking status: Never Smoker  . Smokeless tobacco: Never Used  . Tobacco comment: never used tobacco  Substance and Sexual Activity  . Alcohol use: No    Alcohol/week: 0.0 standard drinks  . Drug use: No  . Sexual activity: Yes    Partners: Male  Lifestyle  . Physical activity:    Days per  week: Not on file    Minutes per session: Not on file  . Stress: Not on file  Relationships  . Social connections:    Talks on phone: Not on file    Gets together: Not on file    Attends religious service: Not on file    Active member of club or organization: Not on file    Attends meetings of clubs or organizations: Not on file    Relationship status: Not on file  . Intimate partner violence:    Fear of current or ex partner: Not on file    Emotionally abused: Not on file    Physically abused: Not on file    Forced sexual activity: Not on file  Other Topics Concern  . Not on file  Social History Narrative   From [place]    2 children :   female  2006, female  2012        Family History  Problem Relation Age of Onset  . Hypertension Maternal Grandmother   . Cancer Maternal Grandfather   . Heart disease Paternal Grandmother   . Stroke Paternal Grandfather   . Diabetes Neg Hx   . Colon cancer Neg Hx   . Breast cancer Neg Hx  Allergies as of 09/13/2018   No Known Allergies     Medication List       Accurate as of September 13, 2018 11:59 PM. Always use your most recent med list.        cholecalciferol 1000 units tablet Commonly known as:  VITAMIN D Take 1,000 Units by mouth daily.   fluticasone 0.05 % cream Commonly known as:  CUTIVATE APP EXT AA OF THE EYELID UP TO BID PRN   Lidocaine (Anorectal) 5 % Crea 3 times a day as needed for itching   nystatin-triamcinolone ointment Commonly known as:  MYCOLOG Apply 1 application topically 2 (two) times daily.   PREMARIN vaginal cream Generic drug:  conjugated estrogens   prenatal multivitamin Tabs tablet Take 1 tablet by mouth daily at 12 noon.   SPRYCEL 50 MG tablet Generic drug:  dasatinib TAKE 1 TABLET (50 MG TOTAL) BY MOUTH DAILY.   Vitamins/Minerals Tabs Take by mouth.           Objective:   Physical Exam BP 116/64 (BP Location: Left Arm, Patient Position: Sitting, Cuff Size: Small)    Pulse 60   Temp 97.8 F (36.6 C) (Oral)   Resp 16   Ht 5\' 3"  (1.6 m)   Wt 202 lb 6 oz (91.8 kg)   SpO2 96%   BMI 35.85 kg/m  General: Well developed, NAD, BMI noted Neck: No  thyromegaly  HEENT:  Normocephalic . Face symmetric, atraumatic Lungs:  CTA B Normal respiratory effort, no intercostal retractions, no accessory muscle use. Heart: RRR,  no murmur.  No pretibial edema bilaterally  Abdomen:  Not distended, soft, non-tender. No rebound or rigidity.   Skin: Exposed areas without rash. Not pale. Not jaundice Neurologic:  alert & oriented X3.  Speech normal, gait appropriate for age and unassisted Strength symmetric and appropriate for age.. EOMI.  Pupils equal and reactive.  DTR symmetric. Psych: Cognition and judgment appear intact.  Cooperative with normal attention span and concentration.  Behavior appropriate. No anxious or depressed appearing.     Assessment     Assessment  Anxiety depression --- dx 2014 ADD dx 2015 , rx  meds 2015 , self d/c ~ 04-2015  CML ---  DX 2013 + PPD, s/p treatment, had a BCG Glaucoma suspect BTL Menopausal age 63   PLAN  CML: Per hematology Headache: As described above, by definition is a  new onset of headache (different from her "regular" headaches ) in a 48 year old female and described as "worst of life".  . Recommend to monitor symptoms, watch for triggers, proceed with a brain MRI.  Check a sed rate.  If she is not better in the next few months she will let me know.  Tensional HA?  rx Nortriptyline?  Neurology eval? Perianal dermatitis: Better, encouraged to use Mycolog as needed and powder daily RTC 1 year

## 2018-09-13 NOTE — Patient Instructions (Addendum)
GO TO THE LAB : Get the blood work     GO TO THE FRONT DESK Schedule your next appointment   For a physical exam in 1 year   Will call you with the schedule for the brain MRI.  If you do not hear from [place] in 10 days please let us know  Call if your headaches are more frequent or severe

## 2018-09-13 NOTE — Progress Notes (Signed)
Pre visit review using our clinic review tool, if applicable. No additional management support is needed unless otherwise documented below in the visit note. 

## 2018-09-14 NOTE — Assessment & Plan Note (Signed)
CML: Per hematology Headache: As described above, by definition is a  new onset of headache (different from her "regular" headaches ) in a 48 year old female and described as "worst of life".  . Recommend to monitor symptoms, watch for triggers, proceed with a brain MRI.  Check a sed rate.  If she is not better in the next few months she will let me know.  Tensional HA?  rx Nortriptyline?  Neurology eval? Perianal dermatitis: Better, encouraged to use Mycolog as needed and powder daily RTC 1 year

## 2018-09-15 ENCOUNTER — Ambulatory Visit (HOSPITAL_BASED_OUTPATIENT_CLINIC_OR_DEPARTMENT_OTHER)
Admission: RE | Admit: 2018-09-15 | Discharge: 2018-09-15 | Disposition: A | Discharge status: Home / Self Care | Payer: BC Managed Care – PPO | Source: Ambulatory Visit | Attending: Internal Medicine | Admitting: Internal Medicine

## 2018-09-15 ENCOUNTER — Ambulatory Visit (HOSPITAL_BASED_OUTPATIENT_CLINIC_OR_DEPARTMENT_OTHER): Admission: RE | Admit: 2018-09-15 | Payer: BC Managed Care – PPO | Source: Ambulatory Visit

## 2018-09-15 DIAGNOSIS — G4452 New daily persistent headache (NDPH): Secondary | ICD-10-CM | POA: Insufficient documentation

## 2018-10-03 MED FILL — SPRYCEL 50 MG TABLET: 50 | 30 days supply | Qty: 30 | Fill #1

## 2018-10-29 ENCOUNTER — Encounter: Payer: Self-pay | Admitting: Hematology & Oncology

## 2018-11-02 ENCOUNTER — Other Ambulatory Visit: Payer: Self-pay

## 2018-11-02 ENCOUNTER — Telehealth: Payer: Self-pay | Admitting: Hematology & Oncology

## 2018-11-02 DIAGNOSIS — C9210 Chronic myeloid leukemia, BCR/ABL-positive, not having achieved remission: Secondary | ICD-10-CM

## 2018-11-02 MED FILL — SPRYCEL 50 MG TABLET: 50 | 30 days supply | Qty: 30 | Fill #2

## 2018-11-02 NOTE — Telephone Encounter (Signed)
Called and spoke with patient regarding r/s her 3/23 appt due to 3/16 [facility] / letter/calendar mailed per 3/20 staff msg

## 2018-11-05 ENCOUNTER — Inpatient Hospital Stay: Payer: BC Managed Care – PPO | Admitting: Hematology & Oncology

## 2018-11-05 ENCOUNTER — Inpatient Hospital Stay: Payer: BC Managed Care – PPO

## 2018-11-06 ENCOUNTER — Telehealth: Payer: Self-pay | Admitting: Hematology & Oncology

## 2018-11-06 NOTE — Telephone Encounter (Signed)
Appointments scheduled letter/calendar mailed per 3/20 staff message

## 2018-11-26 ENCOUNTER — Encounter: Payer: Self-pay | Admitting: Hematology & Oncology

## 2018-11-26 ENCOUNTER — Other Ambulatory Visit: Payer: Self-pay

## 2018-11-26 ENCOUNTER — Inpatient Hospital Stay (HOSPITAL_BASED_OUTPATIENT_CLINIC_OR_DEPARTMENT_OTHER): Payer: BC Managed Care – PPO | Admitting: Hematology & Oncology

## 2018-11-26 ENCOUNTER — Inpatient Hospital Stay: Payer: BC Managed Care – PPO | Attending: Hematology & Oncology

## 2018-11-26 VITALS — BP 122/76 | HR 70 | Temp 98.3°F | Resp 16 | Wt 209.0 lb

## 2018-11-26 DIAGNOSIS — C9211 Chronic myeloid leukemia, BCR/ABL-positive, in remission: Secondary | ICD-10-CM | POA: Diagnosis present

## 2018-11-26 DIAGNOSIS — C9210 Chronic myeloid leukemia, BCR/ABL-positive, not having achieved remission: Secondary | ICD-10-CM

## 2018-11-26 LAB — CMP (CANCER CENTER ONLY)
ALT: 15 U/L (ref 0–44)
AST: 18 U/L (ref 15–41)
Albumin: 4.5 g/dL (ref 3.5–5.0)
Alkaline Phosphatase: 71 U/L (ref 38–126)
Anion gap: 7 (ref 5–15)
BUN: 19 mg/dL (ref 6–20)
CO2: 29 mmol/L (ref 22–32)
Calcium: 9.3 mg/dL (ref 8.9–10.3)
Chloride: 102 mmol/L (ref 98–111)
Creatinine: 1.09 mg/dL — ABNORMAL HIGH (ref 0.44–1.00)
GFR, Est AFR Am: >60 mL/min (ref >60)
GFR, Estimated: 60 mL/min — ABNORMAL LOW (ref >60)
Glucose, Bld: 94 mg/dL (ref 70–99)
Potassium: 4.6 mmol/L (ref 3.5–5.1)
Sodium: 138 mmol/L (ref 135–145)
Total Bilirubin: 0.3 mg/dL (ref 0.3–1.2)
Total Protein: 7.1 g/dL (ref 6.5–8.1)

## 2018-11-26 LAB — CBC WITH DIFFERENTIAL (CANCER CENTER ONLY)
Abs Immature Granulocytes: 0.02 10*3/uL (ref 0.00–0.07)
Basophils Absolute: 0 10*3/uL (ref 0.0–0.1)
Basophils Relative: 1 %
Eosinophils Absolute: 0.1 10*3/uL (ref 0.0–0.5)
Eosinophils Relative: 2 %
HCT: 36.6 % (ref 36.0–46.0)
Hemoglobin: 12 g/dL (ref 12.0–15.0)
Immature Granulocytes: 1 %
Lymphocytes Relative: 36 %
Lymphs Abs: 1.3 10*3/uL (ref 0.7–4.0)
MCH: 29.9 pg (ref 26.0–34.0)
MCHC: 32.8 g/dL (ref 30.0–36.0)
MCV: 91.3 fL (ref 80.0–100.0)
Monocytes Absolute: 0.2 10*3/uL (ref 0.1–1.0)
Monocytes Relative: 5 %
Neutro Abs: 2.1 10*3/uL (ref 1.7–7.7)
Neutrophils Relative %: 55 %
Platelet Count: 191 10*3/uL (ref 150–400)
RBC: 4.01 MIL/uL (ref 3.87–5.11)
RDW: 13.3 % (ref 11.5–15.5)
WBC Count: 3.7 10*3/uL — ABNORMAL LOW (ref 4.0–10.5)
nRBC: 0 % (ref 0.0–0.2)

## 2018-11-26 LAB — SAVE SMEAR(SSMR), FOR PROVIDER SLIDE REVIEW

## 2018-11-26 NOTE — Progress Notes (Signed)
Hematology and Oncology Follow Up Visit  [name] [id number] 04-19-1971 48 y.o. 11/26/2018   Principle Diagnosis:  Chronic phase CML-molecular remission  Current Therapy:   Sprycel 50 mg by mouth daily   Interim History:  [name] is here today for follow-up.  She is doing pretty well.  She is still in a major molecular remission.  Her BCR/ABL ratio was nondetectable.  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  She also has her 2 children and herself that she has to try to make sure they stay on track with their education.  She did have a nice Christmas.  She has had no problems with her bowels or bladder.  She no longer has her monthly cycles.  She is happy about this.  She has had no issues with fever.  She is had no cough.  She has had no headache.  There has been no leg swelling.  This will be the first year that she and her husband can boat.  She is now a [place] citizen.  She got her [place] citizenship last year.  Overall, her performance status is ECOG 0.  Medications:  Allergies as of 11/26/2018   No Known Allergies     Medication List       Accurate as of November 26, 2018  3:34 PM. Always use your most recent med list.        cholecalciferol 1000 units tablet Commonly known as:  VITAMIN D Take 1,000 Units by mouth daily.   fluticasone 0.05 % cream Commonly known as:  CUTIVATE APP EXT AA OF THE EYELID UP TO BID PRN   Lidocaine (Anorectal) 5 % Crea 3 times a day as needed for itching   nystatin-triamcinolone ointment Commonly known as:  MYCOLOG Apply 1 application topically 2 (two) times daily.   Premarin vaginal cream Generic drug:  conjugated estrogens   PRENATAL 1+1 PO Prenatal   prenatal multivitamin Tabs tablet Take 1 tablet by mouth daily at 12 noon.   Sprycel 50 MG tablet Generic drug:  dasatinib TAKE 1 TABLET (50 MG TOTAL) BY MOUTH DAILY.   VITAMIN D (ERGOCALCIFEROL) PO  Vitamin D   Vitamins/Minerals Tabs Take by mouth.       Allergies: No Known Allergies  Past Medical History, Surgical history, Social history, and Family History were reviewed and updated.  Review of Systems: Review of Systems  Constitutional: Negative.   HENT: Negative.   Eyes: Negative.   Respiratory: Negative.   Cardiovascular: Negative.   Gastrointestinal: Negative.   Genitourinary: Negative.   Musculoskeletal: Positive for myalgias.  Skin: Negative.   Neurological: Negative.   Endo/Heme/Allergies: Negative.   Psychiatric/Behavioral: Negative.     Physical Exam:  weight is 209 lb (94.8 kg). Her oral temperature is 98.3 F (36.8 C). Her blood pressure is 122/76 and her pulse is 70. Her respiration is 16 and oxygen saturation is 100%.   Wt Readings from Last 3 Encounters:  11/26/18 209 lb (94.8 kg)  09/13/18 202 lb 6 oz (91.8 kg)  06/08/18 207 lb (93.9 kg)    Physical Exam Vitals signs reviewed.  HENT:     Head: Normocephalic and atraumatic.  Eyes:     Pupils: Pupils are equal, round, and reactive to light.  Neck:     Musculoskeletal: Normal range of motion.  Cardiovascular:     Rate and Rhythm: Normal rate  and regular rhythm.     Heart sounds: Normal heart sounds.  Pulmonary:     Effort: Pulmonary effort is normal.     Breath sounds: Normal breath sounds.  Abdominal:     General: Bowel sounds are normal.     Palpations: Abdomen is soft.  Musculoskeletal: Normal range of motion.        General: No tenderness or deformity.  Lymphadenopathy:     Cervical: No cervical adenopathy.  Skin:    General: Skin is warm and dry.     Findings: No erythema or rash.  Neurological:     Mental Status: She is alert and oriented to person, place, and time.  Psychiatric:        Behavior: Behavior normal.        Thought Content: Thought content normal.        Judgment: Judgment normal.     Lab Results  Component Value Date   WBC 3.7 (L) 11/26/2018   HGB 12.0  11/26/2018   HCT 36.6 11/26/2018   MCV 91.3 11/26/2018   PLT 191 11/26/2018   Lab Results  Component Value Date   FERRITIN 158 12/30/2016   IRON 49 12/30/2016   TIBC 285 12/30/2016   UIBC 236 12/30/2016   IRONPCTSAT 17 (L) 12/30/2016   Lab Results  Component Value Date   RBC 4.01 11/26/2018   No results found for: KPAFRELGTCHN, LAMBDASER, KAPLAMBRATIO No results found for: IGGSERUM, IGA, IGMSERUM No results found for: Odetta Pink, SPEI   Chemistry      Component Value Date/Time   NA 138 11/26/2018 1418   NA 143 08/10/2017 1518   K 4.6 11/26/2018 1418   K 4.2 08/10/2017 1518   CL 102 11/26/2018 1418   CL 106 08/10/2017 1518   CO2 29 11/26/2018 1418   CO2 31 08/10/2017 1518   BUN 19 11/26/2018 1418   BUN 21 08/10/2017 1518   CREATININE 1.09 (H) 11/26/2018 1418   CREATININE 0.8 08/10/2017 1518      Component Value Date/Time   CALCIUM 9.3 11/26/2018 1418   CALCIUM 9.2 08/10/2017 1518   ALKPHOS 71 11/26/2018 1418   ALKPHOS 65 08/10/2017 1518   AST 18 11/26/2018 1418   ALT 15 11/26/2018 1418   ALT 24 08/10/2017 1518   BILITOT 0.3 11/26/2018 1418      Impression and Plan: [name] is a very pleasant 48 yo Hispanic female with chronic phase CML.   So far, she has been in a major molecular remission.  We will plan to get her back in 6 months now.  I really feel confident that she is going to do well.   Volanda Napoleon, MD 4/13/20203:34 PM

## 2018-12-05 ENCOUNTER — Other Ambulatory Visit: Payer: Self-pay | Admitting: Hematology & Oncology

## 2018-12-05 LAB — BCR/ABL

## 2018-12-10 MED FILL — SPRYCEL 50 MG TABLET: 50 | 30 days supply | Qty: 30 | Fill #3

## 2019-01-03 MED FILL — SPRYCEL 50 MG TABLET: 50 | 30 days supply | Qty: 30 | Fill #4

## 2019-02-11 MED FILL — SPRYCEL 50 MG TABLET: 50 | 30 days supply | Qty: 30 | Fill #5

## 2019-03-12 MED FILL — SPRYCEL 50 MG TABLET: 50 | 30 days supply | Qty: 30 | Fill #6

## 2019-03-18 ENCOUNTER — Encounter: Payer: Self-pay | Admitting: Internal Medicine

## 2019-03-29 ENCOUNTER — Encounter: Payer: Self-pay | Admitting: Hematology & Oncology

## 2019-04-01 ENCOUNTER — Encounter: Payer: Self-pay | Admitting: *Deleted

## 2019-04-11 MED FILL — SPRYCEL 50 MG TABLET: 50 | 30 days supply | Qty: 30 | Fill #7

## 2019-05-15 MED FILL — SPRYCEL 50 MG TABLET: 50 | 30 days supply | Qty: 30 | Fill #8

## 2019-05-27 ENCOUNTER — Inpatient Hospital Stay: Payer: BC Managed Care – PPO

## 2019-05-27 ENCOUNTER — Inpatient Hospital Stay: Payer: BC Managed Care – PPO | Attending: Hematology & Oncology | Admitting: Family

## 2019-05-27 ENCOUNTER — Other Ambulatory Visit: Payer: Self-pay

## 2019-05-27 ENCOUNTER — Encounter: Payer: Self-pay | Admitting: Family

## 2019-05-27 VITALS — BP 133/78 | HR 67 | Temp 97.5°F | Resp 18 | Ht 67.0 in | Wt 190.0 lb

## 2019-05-27 DIAGNOSIS — C9211 Chronic myeloid leukemia, BCR/ABL-positive, in remission: Secondary | ICD-10-CM | POA: Diagnosis present

## 2019-05-27 DIAGNOSIS — R21 Rash and other nonspecific skin eruption: Secondary | ICD-10-CM | POA: Diagnosis not present

## 2019-05-27 DIAGNOSIS — Z79899 Other long term (current) drug therapy: Secondary | ICD-10-CM | POA: Insufficient documentation

## 2019-05-27 DIAGNOSIS — C9210 Chronic myeloid leukemia, BCR/ABL-positive, not having achieved remission: Secondary | ICD-10-CM | POA: Diagnosis not present

## 2019-05-27 LAB — CBC WITH DIFFERENTIAL (CANCER CENTER ONLY)
Abs Immature Granulocytes: 0.01 10*3/uL (ref 0.00–0.07)
Basophils Absolute: 0 10*3/uL (ref 0.0–0.1)
Basophils Relative: 1 %
Eosinophils Absolute: 0.1 10*3/uL (ref 0.0–0.5)
Eosinophils Relative: 2 %
HCT: 35.9 % — ABNORMAL LOW (ref 36.0–46.0)
Hemoglobin: 11.5 g/dL — ABNORMAL LOW (ref 12.0–15.0)
Immature Granulocytes: 0 %
Lymphocytes Relative: 40 %
Lymphs Abs: 1.4 10*3/uL (ref 0.7–4.0)
MCH: 29.6 pg (ref 26.0–34.0)
MCHC: 32 g/dL (ref 30.0–36.0)
MCV: 92.3 fL (ref 80.0–100.0)
Monocytes Absolute: 0.2 10*3/uL (ref 0.1–1.0)
Monocytes Relative: 6 %
Neutro Abs: 1.8 10*3/uL (ref 1.7–7.7)
Neutrophils Relative %: 51 %
Platelet Count: 190 10*3/uL (ref 150–400)
RBC: 3.89 MIL/uL (ref 3.87–5.11)
RDW: 13.2 % (ref 11.5–15.5)
WBC Count: 3.4 10*3/uL — ABNORMAL LOW (ref 4.0–10.5)
nRBC: 0 % (ref 0.0–0.2)

## 2019-05-27 LAB — CMP (CANCER CENTER ONLY)
ALT: 13 U/L (ref 0–44)
AST: 16 U/L (ref 15–41)
Albumin: 4.5 g/dL (ref 3.5–5.0)
Alkaline Phosphatase: 54 U/L (ref 38–126)
Anion gap: 7 (ref 5–15)
BUN: 14 mg/dL (ref 6–20)
CO2: 28 mmol/L (ref 22–32)
Calcium: 9.9 mg/dL (ref 8.9–10.3)
Chloride: 104 mmol/L (ref 98–111)
Creatinine: 0.88 mg/dL (ref 0.44–1.00)
GFR, Est AFR Am: >60 mL/min (ref >60)
GFR, Estimated: >60 mL/min (ref >60)
Glucose, Bld: 94 mg/dL (ref 70–99)
Potassium: 4.1 mmol/L (ref 3.5–5.1)
Sodium: 139 mmol/L (ref 135–145)
Total Bilirubin: 0.5 mg/dL (ref 0.3–1.2)
Total Protein: 7.3 g/dL (ref 6.5–8.1)

## 2019-05-27 LAB — SAVE SMEAR(SSMR), FOR PROVIDER SLIDE REVIEW

## 2019-05-27 NOTE — Progress Notes (Signed)
Hematology and Oncology Follow Up Visit  [name] [id number] Dec 08, 1970 48 y.o. 05/27/2019   Principle Diagnosis:  Chronic phase CML-molecular remission  Current Therapy:   Sprycel 50 mg by mouth daily   Interim History:  [name] is here today for follow-up. She is doing well on Sprycel and verbalized that she is taking as prescribed.  She has a rash between her fingers and on the knuckles of both hands. She states that this started in the summer and that prescription creams have not helped so far. She is currently using Aveeno and it seems to be helping a little. She has the contact number for [facility] and plans to request a second opinion from them.  I did check up to date and there is a < 10% chance of dermatitis/eczema with Sprycel. She has been on this medication for several years and this is the first time she has noted a rash.  BCR/ABL is pending. Result in April showed her to still be in MMR.  No fever, chills, n/v, cough, dizziness, SOB, chest pain, palpitations, abdominal pain or changes in bowel or bladder habits.  No swelling, tenderness, numbness or tingling in her extremities.  She has maintained a good appetite and is staying well hydrated. Her weight is stable.   ECOG Performance Status: 0 - Asymptomatic  Medications:  Allergies as of 05/27/2019   No Known Allergies     Medication List       Accurate as of May 27, 2019  3:08 PM. If you have any questions, ask your nurse or doctor.        cholecalciferol 1000 units tablet Commonly known as: VITAMIN D Take 1,000 Units by mouth daily.   fluticasone 0.05 % cream Commonly known as: CUTIVATE APP EXT Lauren OF THE EYELID UP TO BID PRN   Lidocaine (Anorectal) 5 % Crea 3 times a day as needed for itching   nystatin-triamcinolone ointment Commonly known as: MYCOLOG Apply 1 application topically 2 (two) times daily.   Premarin vaginal cream Generic drug: conjugated estrogens   PRENATAL 1+1  PO Prenatal   prenatal multivitamin Tabs tablet Take 1 tablet by mouth daily at 12 noon.   Sprycel 50 MG tablet Generic drug: dasatinib TAKE 1 TABLET (50 MG TOTAL) BY MOUTH DAILY.   VITAMIN D (ERGOCALCIFEROL) PO Vitamin D   Vitamins/Minerals Tabs Take by mouth.       Allergies: No Known Allergies  Past Medical History, Surgical history, Social history, and Family History were reviewed and updated.  Review of Systems: All other 10 point review of systems is negative.   Physical Exam:  vitals were not taken for this visit.   Wt Readings from Last 3 Encounters:  11/26/18 209 lb (94.8 kg)  09/13/18 202 lb 6 oz (91.8 kg)  06/08/18 207 lb (93.9 kg)    Ocular: Sclerae unicteric, pupils equal, round and reactive to light Ear-nose-throat: Oropharynx clear, dentition fair Lymphatic: No cervical, supraclavicular or axillary adenopathy Lungs no rales or rhonchi, good excursion bilaterally Heart regular rate and rhythm, no murmur appreciated Abd soft, nontender, positive bowel sounds, no liver or spleen tip palpated on exam, no fluid wave  MSK no focal spinal tenderness, no joint edema Neuro: non-focal, well-oriented, appropriate affect Breasts: Deferred   Lab Results  Component Value Date   WBC 3.4 (L) 05/27/2019   HGB 11.5 (L) 05/27/2019   HCT 35.9 (L) 05/27/2019   MCV 92.3 05/27/2019   PLT 190 05/27/2019   Lab  Results  Component Value Date   FERRITIN 158 12/30/2016   IRON 49 12/30/2016   TIBC 285 12/30/2016   UIBC 236 12/30/2016   IRONPCTSAT 17 (L) 12/30/2016   Lab Results  Component Value Date   RBC 3.89 05/27/2019   No results found for: KPAFRELGTCHN, LAMBDASER, KAPLAMBRATIO No results found for: IGGSERUM, IGA, IGMSERUM No results found for: Odetta Pink, SPEI   Chemistry      Component Value Date/Time   NA 138 11/26/2018 1418   NA 143 08/10/2017 1518   K 4.6 11/26/2018 1418   K 4.2 08/10/2017 1518    CL 102 11/26/2018 1418   CL 106 08/10/2017 1518   CO2 29 11/26/2018 1418   CO2 31 08/10/2017 1518   BUN 19 11/26/2018 1418   BUN 21 08/10/2017 1518   CREATININE 1.09 (H) 11/26/2018 1418   CREATININE 0.8 08/10/2017 1518      Component Value Date/Time   CALCIUM 9.3 11/26/2018 1418   CALCIUM 9.2 08/10/2017 1518   ALKPHOS 71 11/26/2018 1418   ALKPHOS 65 08/10/2017 1518   AST 18 11/26/2018 1418   ALT 15 11/26/2018 1418   ALT 24 08/10/2017 1518   BILITOT 0.3 11/26/2018 1418       Impression and Plan: [name] is a very pleasant 48 yo Hispanic female Hispanic female with chronic phase CML.  BCR/ABL is pending.  She will continue her same regimen with Sprycel for now.  We will plan to see her back in another 6 months.  She will contact our office with any questions or concerns. We can certainly see her sooner if needed.   Laverna Peace, NP 10/12/20203:08 PM

## 2019-05-28 LAB — LACTATE DEHYDROGENASE: LDH: 98 U/L (ref 98–192)

## 2019-05-29 ENCOUNTER — Telehealth: Payer: Self-pay | Admitting: Hematology & Oncology

## 2019-06-10 LAB — BCR/ABL

## 2019-06-11 ENCOUNTER — Telehealth: Payer: Self-pay | Admitting: *Deleted

## 2019-06-11 MED FILL — SPRYCEL 50 MG TABLET: 50 | 30 days supply | Qty: 30 | Fill #9

## 2019-06-11 NOTE — Telephone Encounter (Signed)
Notified pt of results. Pt verbalized understanding. No concerns at this time. 

## 2019-06-11 NOTE — Telephone Encounter (Signed)
-----   Message from Volanda Napoleon, MD sent at 06/11/2019  6:26 AM EDT ----- Call - the CML is still in remission!!!  Enjoy the Holidays!!!!  pete

## 2019-07-09 MED FILL — SPRYCEL 50 MG TABLET: 50 | 30 days supply | Qty: 30 | Fill #10

## 2019-08-05 ENCOUNTER — Telehealth: Payer: Self-pay | Admitting: Pharmacy Technician

## 2019-08-05 NOTE — Telephone Encounter (Signed)
Oral Oncology Patient Advocate Encounter   Received notification from [facility] that prior authorization for Sprycel is required.   PA submitted on CoverMyMeds Key [id number] Status is pending   Oral Oncology Clinic will continue to follow.  [place] Patient [place] Phone [phone] Fax [phone] 08/06/2019 8:52 AM

## 2019-08-06 NOTE — Telephone Encounter (Signed)
Oral Oncology Patient Advocate Encounter  Prior Authorization for Sprycel has been approved.    PA# [id number] Effective dates: 08/06/2019 through 08/05/2020  Oral Oncology Clinic will continue to follow.   [place] Patient [place] Phone [phone] Fax [phone] 08/06/2019 8:53 AM

## 2019-08-07 MED FILL — SPRYCEL 50 MG TABLET: 50 | 30 days supply | Qty: 30 | Fill #11

## 2019-08-13 ENCOUNTER — Encounter: Payer: Self-pay | Admitting: Internal Medicine

## 2019-08-14 ENCOUNTER — Encounter: Payer: Self-pay | Admitting: Hematology & Oncology

## 2019-09-09 ENCOUNTER — Other Ambulatory Visit: Payer: Self-pay | Admitting: Hematology & Oncology

## 2019-09-11 MED FILL — SPRYCEL 50 MG TABLET: 50 | 30 days supply | Qty: 30 | Fill #0

## 2019-09-16 ENCOUNTER — Encounter: Payer: Self-pay | Admitting: Internal Medicine

## 2019-09-17 ENCOUNTER — Other Ambulatory Visit: Payer: Self-pay

## 2019-09-18 ENCOUNTER — Encounter: Payer: Self-pay | Admitting: Internal Medicine

## 2019-09-18 ENCOUNTER — Ambulatory Visit (INDEPENDENT_AMBULATORY_CARE_PROVIDER_SITE_OTHER): Payer: BC Managed Care – PPO | Admitting: Internal Medicine

## 2019-09-18 ENCOUNTER — Other Ambulatory Visit: Payer: Self-pay

## 2019-09-18 VITALS — BP 106/55 | HR 67 | Temp 97.0°F | Resp 16 | Ht 67.0 in | Wt 193.2 lb

## 2019-09-18 DIAGNOSIS — Z0000 Encounter for general adult medical examination without abnormal findings: Secondary | ICD-10-CM | POA: Diagnosis not present

## 2019-09-18 DIAGNOSIS — Z1211 Encounter for screening for malignant neoplasm of colon: Secondary | ICD-10-CM

## 2019-09-18 LAB — HEMOGLOBIN A1C: Hgb A1c MFr Bld: 5.6 % (ref 4.6–6.5)

## 2019-09-18 LAB — LIPID PANEL
Cholesterol: 221 mg/dL — ABNORMAL HIGH (ref 0–200)
HDL: 82.3 mg/dL (ref >39.00)
LDL Cholesterol: 129 mg/dL — ABNORMAL HIGH (ref 0–99)
NonHDL: 138.6
Total CHOL/HDL Ratio: 3
Triglycerides: 47 mg/dL (ref 0.0–149.0)
VLDL: 9.4 mg/dL (ref 0.0–40.0)

## 2019-09-18 LAB — TSH: TSH: 3.83 u[IU]/mL (ref 0.35–4.50)

## 2019-09-18 NOTE — Patient Instructions (Addendum)
GO TO THE LAB : Get the blood work     [name], [name]

## 2019-09-18 NOTE — Progress Notes (Signed)
Pre visit review using our clinic review tool, if applicable. No additional management support is needed unless otherwise documented below in the visit note. 

## 2019-09-18 NOTE — Assessment & Plan Note (Addendum)
-  Td 08/2018 - pnm 23 2017; prevnar 04-2016 -  Had a  flu shot   -Female care per gyn, [facility], due for a visit and MMG  , plans to call  -CCS: No previous colonoscopy, 3 modalities discussed, elected C-scope, refer to GI -[place] Ao 05-2014 neg for AAA -Menopausal, age 49, rx DEXA at ~ age 80. On calcium vitamin D -Diet-exercise discussed, doing well, has lost weight.  Encouraged to continue that path -Available labs reviewed, will get FLP, A1c, TSH

## 2019-09-18 NOTE — Progress Notes (Signed)
   Subjective:    Patient ID: [name], female    DOB: 1971/02/27, 49 y.o.   MRN: [id number]  DOS:  09/18/2019 Type of visit - description: CPX In general feeling well, has no major concerns.  Wt Readings from Last 3 Encounters:  09/18/19 193 lb 4 oz (87.7 kg)  05/27/19 190 lb (86.2 kg)  11/26/18 209 lb (94.8 kg)     Review of Systems   A 14 point review of systems is negative    Past Medical History:  Diagnosis Date  . Anxiety and depression 04/02/2013  . CML (chronic myelocytic leukemia) ([place]) 2013  . Glaucoma suspect   . 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 CESAREAN SECTION  2006 & 2012  . TUBAL LIGATION     Family History  Problem Relation Age of Onset  . Hypertension Maternal Grandmother   . Cancer Maternal Grandfather   . Heart disease Paternal Grandmother   . Stroke Paternal Grandfather   . Diabetes Neg Hx   . Colon cancer Neg Hx   . Breast cancer Neg Hx         Objective:   Physical Exam BP (!) 106/55 (BP Location: Left Arm, Patient Position: Sitting, Cuff Size: Normal)   Pulse 67   Temp (!) 97 F (36.1 C) (Temporal)   Resp 16   Ht 5\' 7"  (1.702 m)   Wt 193 lb 4 oz (87.7 kg)   SpO2 100%   BMI 30.27 kg/m  General: Well developed, NAD, BMI noted Neck: No  thyromegaly  HEENT:  Normocephalic . Face symmetric, atraumatic Lungs:  CTA B Normal respiratory effort, no intercostal retractions, no accessory muscle use. Heart: RRR,  no murmur.  No pretibial edema bilaterally  Abdomen:  Not distended, soft, non-tender. No rebound or rigidity.   Skin: Exposed areas without rash. Not pale. Not jaundice Neurologic:  alert & oriented X3.  Speech normal, gait appropriate for age and unassisted Strength symmetric and appropriate for age.  Psych: Cognition and judgment appear intact.  Cooperative with normal attention span and concentration.  Behavior appropriate. No anxious  or depressed appearing.      Assessment     Assessment  Anxiety depression --- dx 2014 ADD dx 2015 , rx  meds 2015 , self d/c ~ 04-2015  CML ---  DX 2013 + PPD, s/p treatment, had a BCG Glaucoma suspect BTL Menopausal age 31 Chronic dermatitis (perianal, GU area) has tried different medications, currently on hydrocortisone as needed   PLAN  Here for CPX Anxiety depression: Currently controlled on no medications. CML: Last visit with hematology 05/27/2019, labs showed remission. 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 RTC 1 year  This visit occurred during the SARS-CoV-2 public health emergency.  Safety protocols were in place, including screening questions prior to the visit, additional usage of staff PPE, and extensive cleaning of exam room while observing appropriate contact time as indicated for disinfecting solutions.

## 2019-09-19 NOTE — Assessment & Plan Note (Signed)
Here for CPX Anxiety depression: Currently controlled on no medications. CML: Last visit with hematology 05/27/2019, labs showed remission. 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 RTC 1 year

## 2019-09-24 ENCOUNTER — Encounter: Payer: Self-pay | Admitting: Internal Medicine

## 2019-10-07 MED FILL — SPRYCEL 50 MG TABLET: 50 | 30 days supply | Qty: 30 | Fill #1

## 2019-10-09 ENCOUNTER — Encounter: Payer: Self-pay | Admitting: Internal Medicine

## 2019-10-31 MED FILL — SPRYCEL 50 MG TABLET: 50 | 30 days supply | Qty: 30 | Fill #2

## 2019-11-18 ENCOUNTER — Encounter: Payer: Self-pay | Admitting: Internal Medicine

## 2019-11-25 ENCOUNTER — Inpatient Hospital Stay: Payer: BC Managed Care – PPO

## 2019-11-25 ENCOUNTER — Inpatient Hospital Stay: Payer: BC Managed Care – PPO | Admitting: Hematology & Oncology

## 2019-12-04 ENCOUNTER — Encounter: Payer: Self-pay | Admitting: Hematology & Oncology

## 2019-12-04 ENCOUNTER — Inpatient Hospital Stay: Payer: BC Managed Care – PPO | Attending: Hematology & Oncology

## 2019-12-04 ENCOUNTER — Inpatient Hospital Stay (HOSPITAL_BASED_OUTPATIENT_CLINIC_OR_DEPARTMENT_OTHER): Payer: BC Managed Care – PPO | Admitting: Hematology & Oncology

## 2019-12-04 ENCOUNTER — Other Ambulatory Visit: Payer: Self-pay

## 2019-12-04 VITALS — BP 116/75 | HR 66 | Temp 97.6°F | Resp 20 | Wt 184.4 lb

## 2019-12-04 DIAGNOSIS — C9211 Chronic myeloid leukemia, BCR/ABL-positive, in remission: Secondary | ICD-10-CM | POA: Diagnosis not present

## 2019-12-04 DIAGNOSIS — C9210 Chronic myeloid leukemia, BCR/ABL-positive, not having achieved remission: Secondary | ICD-10-CM

## 2019-12-04 DIAGNOSIS — Z79899 Other long term (current) drug therapy: Secondary | ICD-10-CM | POA: Insufficient documentation

## 2019-12-04 LAB — CBC WITH DIFFERENTIAL (CANCER CENTER ONLY)
Abs Immature Granulocytes: 0.01 10*3/uL (ref 0.00–0.07)
Basophils Absolute: 0 10*3/uL (ref 0.0–0.1)
Basophils Relative: 1 %
Eosinophils Absolute: 0.1 10*3/uL (ref 0.0–0.5)
Eosinophils Relative: 3 %
HCT: 39.2 % (ref 36.0–46.0)
Hemoglobin: 12.6 g/dL (ref 12.0–15.0)
Immature Granulocytes: 0 %
Lymphocytes Relative: 38 %
Lymphs Abs: 1.3 10*3/uL (ref 0.7–4.0)
MCH: 29 pg (ref 26.0–34.0)
MCHC: 32.1 g/dL (ref 30.0–36.0)
MCV: 90.3 fL (ref 80.0–100.0)
Monocytes Absolute: 0.2 10*3/uL (ref 0.1–1.0)
Monocytes Relative: 7 %
Neutro Abs: 1.8 10*3/uL (ref 1.7–7.7)
Neutrophils Relative %: 51 %
Platelet Count: 182 10*3/uL (ref 150–400)
RBC: 4.34 MIL/uL (ref 3.87–5.11)
RDW: 13.1 % (ref 11.5–15.5)
WBC Count: 3.5 10*3/uL — ABNORMAL LOW (ref 4.0–10.5)
nRBC: 0 % (ref 0.0–0.2)

## 2019-12-04 LAB — CMP (CANCER CENTER ONLY)
ALT: 11 U/L (ref 0–44)
AST: 15 U/L (ref 15–41)
Albumin: 4.7 g/dL (ref 3.5–5.0)
Alkaline Phosphatase: 67 U/L (ref 38–126)
Anion gap: 6 (ref 5–15)
BUN: 23 mg/dL — ABNORMAL HIGH (ref 6–20)
CO2: 30 mmol/L (ref 22–32)
Calcium: 10.1 mg/dL (ref 8.9–10.3)
Chloride: 103 mmol/L (ref 98–111)
Creatinine: 0.91 mg/dL (ref 0.44–1.00)
GFR, Est AFR Am: >60 mL/min (ref >60)
GFR, Estimated: >60 mL/min (ref >60)
Glucose, Bld: 106 mg/dL — ABNORMAL HIGH (ref 70–99)
Potassium: 4.9 mmol/L (ref 3.5–5.1)
Sodium: 139 mmol/L (ref 135–145)
Total Bilirubin: 0.3 mg/dL (ref 0.3–1.2)
Total Protein: 7.5 g/dL (ref 6.5–8.1)

## 2019-12-04 LAB — SAVE SMEAR(SSMR), FOR PROVIDER SLIDE REVIEW

## 2019-12-04 MED FILL — SPRYCEL 50 MG TABLET: 50 | 30 days supply | Qty: 30 | Fill #3

## 2019-12-04 NOTE — Progress Notes (Signed)
Hematology and Oncology Follow Up Visit  [name] [id number] [birth year] 49 y.o. 12/04/2019   Principle Diagnosis:  Chronic phase CML-molecular remission  Current Therapy:   Sprycel 50 mg by mouth daily   Interim History:  [name] is here today for follow-up.  She is really having a tough time right now.  She is under an incredible amount of stress.  Apparently, her daughter is not doing well emotionally.  She actually is at a special home right now.  I just feel bad for [name].  She really taking this hard.  Hopefully, this is just a "phase" that her daughter is going through.  Her daughter is I think 71 years old.  I know this is a tough time nowadays for teenage girls.  Otherwise, she seems to be holding her own.  There is [place] no evidence of the BCR/ABL gene product in her blood.  She is in a major molecular remission.  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  She is having no problems with nausea or vomiting.  She has had her coronavirus vaccines.  Typically, every summer the family goes down to [place].  Unfortunately they will not be going to down this summer.  She has not had any problems with fever.  She has had no bleeding.  She has had no change in bowel or bladder habits.  Overall, her performance status is ECOG 1.    Medications:  Allergies as of 12/04/2019   No Known Allergies     Medication List       Accurate as of December 04, 2019 12:01 PM. If you have any questions, ask your nurse or doctor.        STOP taking these medications   Lidocaine (Anorectal) 5 % Crea Stopped by: Volanda Napoleon, MD     TAKE these medications   cholecalciferol 1000 units tablet Commonly known as: VITAMIN D Take 1,000 Units by mouth daily.   Premarin vaginal cream Generic drug: conjugated estrogens 1 Applicatorful as needed.   Sprycel 50 MG tablet Generic drug: dasatinib TAKE 1 TABLET (50 MG TOTAL) BY MOUTH DAILY.   Vitamins/Minerals Tabs Take  by mouth daily.       Allergies: No Known Allergies  Past Medical History, Surgical history, Social history, and Family History were reviewed and updated.  Review of Systems: Review of Systems  Constitutional: Negative.   HENT: Negative.   Eyes: Negative.   Respiratory: Negative.   Cardiovascular: Negative.   Gastrointestinal: Negative.   Genitourinary: Negative.   Musculoskeletal: Positive for myalgias.  Skin: Negative.   Neurological: Negative.   Endo/Heme/Allergies: Negative.   Psychiatric/Behavioral: Negative.     Physical Exam:  weight is 184 lb 6.4 oz (83.6 kg). Her temporal temperature is 97.6 F (36.4 C). Her blood pressure is 116/75 and her pulse is 66. Her respiration is 20 and oxygen saturation is 100%.   Wt Readings from Last 3 Encounters:  12/04/19 184 lb 6.4 oz (83.6 kg)  09/18/19 193 lb 4 oz (87.7 kg)  05/27/19 190 lb (86.2 kg)    Physical Exam Vitals reviewed.  HENT:     Head: Normocephalic and atraumatic.  Eyes:     Pupils: Pupils are equal, round, and reactive to light.  Cardiovascular:     Rate and Rhythm: Normal rate and regular rhythm.     Heart sounds: Normal heart sounds.  Pulmonary:     Effort: Pulmonary effort is normal.  Breath sounds: Normal breath sounds.  Abdominal:     General: Bowel sounds are normal.     Palpations: Abdomen is soft.  Musculoskeletal:        General: No tenderness or deformity. Normal range of motion.     Cervical back: Normal range of motion.  Lymphadenopathy:     Cervical: No cervical adenopathy.  Skin:    General: Skin is warm and dry.     Findings: No erythema or rash.  Neurological:     Mental Status: She is alert and oriented to person, place, and time.  Psychiatric:        Behavior: Behavior normal.        Thought Content: Thought content normal.        Judgment: Judgment normal.     Lab Results  Component Value Date   WBC 3.5 (L) 12/04/2019   HGB 12.6 12/04/2019   HCT 39.2 12/04/2019    MCV 90.3 12/04/2019   PLT 182 12/04/2019   Lab Results  Component Value Date   FERRITIN 158 12/30/2016   IRON 49 12/30/2016   TIBC 285 12/30/2016   UIBC 236 12/30/2016   IRONPCTSAT 17 (L) 12/30/2016   Lab Results  Component Value Date   RBC 4.34 12/04/2019   No results found for: KPAFRELGTCHN, LAMBDASER, KAPLAMBRATIO No results found for: IGGSERUM, IGA, IGMSERUM No results found for: Odetta Pink, SPEI   Chemistry      Component Value Date/Time   NA 139 12/04/2019 1016   NA 143 08/10/2017 1518   K 4.9 12/04/2019 1016   K 4.2 08/10/2017 1518   CL 103 12/04/2019 1016   CL 106 08/10/2017 1518   CO2 30 12/04/2019 1016   CO2 31 08/10/2017 1518   BUN 23 (H) 12/04/2019 1016   BUN 21 08/10/2017 1518   CREATININE 0.91 12/04/2019 1016   CREATININE 0.8 08/10/2017 1518      Component Value Date/Time   CALCIUM 10.1 12/04/2019 1016   CALCIUM 9.2 08/10/2017 1518   ALKPHOS 67 12/04/2019 1016   ALKPHOS 65 08/10/2017 1518   AST 15 12/04/2019 1016   ALT 11 12/04/2019 1016   ALT 24 08/10/2017 1518   BILITOT 0.3 12/04/2019 1016      Impression and Plan: [name] is a very pleasant 49 yo Hispanic female Hispanic female with chronic phase CML.   So far, she has been in a major molecular remission.  We will plan to get her back in 6 months now.  I really feel confident that she is going to do well.  I will certainly pray hard for her at she gets through this situation with her daughter.  Again I told her that it is certainly a tough time for her daughter and that her daughter has a good parents and that her daughter will come through this.   Volanda Napoleon, MD 4/21/202112:01 PM

## 2019-12-05 LAB — LACTATE DEHYDROGENASE: LDH: 106 U/L (ref 98–192)

## 2019-12-20 ENCOUNTER — Other Ambulatory Visit: Payer: Self-pay | Admitting: Hematology & Oncology

## 2019-12-20 LAB — BCR/ABL

## 2020-01-08 MED FILL — SPRYCEL 50 MG TABLET: 50 | 30 days supply | Qty: 30 | Fill #4

## 2020-02-04 ENCOUNTER — Ambulatory Visit: Payer: BC Managed Care – PPO | Admitting: Internal Medicine

## 2020-02-04 ENCOUNTER — Encounter: Payer: Self-pay | Admitting: Hematology & Oncology

## 2020-02-06 MED FILL — SPRYCEL 50 MG TABLET: 50 | 30 days supply | Qty: 30 | Fill #5

## 2020-03-03 MED FILL — SPRYCEL 50 MG TABLET: 50 | 30 days supply | Qty: 30 | Fill #6

## 2020-03-06 ENCOUNTER — Encounter: Payer: Self-pay | Admitting: Internal Medicine

## 2020-03-30 LAB — HM MAMMOGRAPHY

## 2020-03-30 LAB — HM PAP SMEAR: HM Pap smear: NEGATIVE

## 2020-04-08 MED FILL — SPRYCEL 50 MG TABLET: 50 | 30 days supply | Qty: 30 | Fill #7

## 2020-04-20 ENCOUNTER — Encounter: Payer: Self-pay | Admitting: Hematology & Oncology

## 2020-05-05 ENCOUNTER — Ambulatory Visit: Payer: BC Managed Care – PPO | Attending: Internal Medicine

## 2020-05-05 DIAGNOSIS — Z23 Encounter for immunization: Secondary | ICD-10-CM

## 2020-05-05 NOTE — Progress Notes (Signed)
   Covid-19 Vaccination Clinic  Name:  [name]    MRN: [id number] DOB: [birth year]  05/05/2020  [name] was observed post Covid-19 immunization for 15 minutes without incident. She was provided with Vaccine Information Sheet and instruction to access the V-Safe system.   [name] was instructed to call 911 with any severe reactions post vaccine: Marland Kitchen Difficulty breathing  . Swelling of face and throat  . A fast heartbeat  . A bad rash all over body  . Dizziness and weakness

## 2020-05-11 MED FILL — SPRYCEL 50 MG TABLET: 50 | 30 days supply | Qty: 30 | Fill #8

## 2020-06-04 ENCOUNTER — Other Ambulatory Visit: Payer: BC Managed Care – PPO

## 2020-06-04 ENCOUNTER — Ambulatory Visit: Payer: BC Managed Care – PPO | Admitting: Family

## 2020-06-05 ENCOUNTER — Inpatient Hospital Stay: Payer: BC Managed Care – PPO | Attending: Family

## 2020-06-05 ENCOUNTER — Encounter: Payer: Self-pay | Admitting: Family

## 2020-06-05 ENCOUNTER — Inpatient Hospital Stay (HOSPITAL_BASED_OUTPATIENT_CLINIC_OR_DEPARTMENT_OTHER): Payer: BC Managed Care – PPO | Admitting: Family

## 2020-06-05 ENCOUNTER — Other Ambulatory Visit: Payer: Self-pay

## 2020-06-05 VITALS — BP 117/72 | HR 63 | Temp 98.0°F | Wt 184.0 lb

## 2020-06-05 DIAGNOSIS — C9210 Chronic myeloid leukemia, BCR/ABL-positive, not having achieved remission: Secondary | ICD-10-CM

## 2020-06-05 DIAGNOSIS — D508 Other iron deficiency anemias: Secondary | ICD-10-CM

## 2020-06-05 DIAGNOSIS — Z79899 Other long term (current) drug therapy: Secondary | ICD-10-CM | POA: Diagnosis not present

## 2020-06-05 LAB — CBC WITH DIFFERENTIAL (CANCER CENTER ONLY)
Abs Immature Granulocytes: 0.01 10*3/uL (ref 0.00–0.07)
Basophils Absolute: 0.1 10*3/uL (ref 0.0–0.1)
Basophils Relative: 1 %
Eosinophils Absolute: 0.1 10*3/uL (ref 0.0–0.5)
Eosinophils Relative: 2 %
HCT: 37.8 % (ref 36.0–46.0)
Hemoglobin: 12.1 g/dL (ref 12.0–15.0)
Immature Granulocytes: 0 %
Lymphocytes Relative: 47 %
Lymphs Abs: 1.8 10*3/uL (ref 0.7–4.0)
MCH: 29.9 pg (ref 26.0–34.0)
MCHC: 32 g/dL (ref 30.0–36.0)
MCV: 93.3 fL (ref 80.0–100.0)
Monocytes Absolute: 0.2 10*3/uL (ref 0.1–1.0)
Monocytes Relative: 6 %
Neutro Abs: 1.7 10*3/uL (ref 1.7–7.7)
Neutrophils Relative %: 44 %
Platelet Count: 192 10*3/uL (ref 150–400)
RBC: 4.05 MIL/uL (ref 3.87–5.11)
RDW: 12.9 % (ref 11.5–15.5)
WBC Count: 3.8 10*3/uL — ABNORMAL LOW (ref 4.0–10.5)
nRBC: 0 % (ref 0.0–0.2)

## 2020-06-05 LAB — CMP (CANCER CENTER ONLY)
ALT: 10 U/L (ref 0–44)
AST: 14 U/L — ABNORMAL LOW (ref 15–41)
Albumin: 4.5 g/dL (ref 3.5–5.0)
Alkaline Phosphatase: 57 U/L (ref 38–126)
Anion gap: 6 (ref 5–15)
BUN: 24 mg/dL — ABNORMAL HIGH (ref 6–20)
CO2: 30 mmol/L (ref 22–32)
Calcium: 10.2 mg/dL (ref 8.9–10.3)
Chloride: 101 mmol/L (ref 98–111)
Creatinine: 0.92 mg/dL (ref 0.44–1.00)
GFR, Estimated: >60 mL/min (ref >60)
Glucose, Bld: 94 mg/dL (ref 70–99)
Potassium: 4.4 mmol/L (ref 3.5–5.1)
Sodium: 137 mmol/L (ref 135–145)
Total Bilirubin: 0.4 mg/dL (ref 0.3–1.2)
Total Protein: 7.3 g/dL (ref 6.5–8.1)

## 2020-06-05 LAB — SAVE SMEAR(SSMR), FOR PROVIDER SLIDE REVIEW

## 2020-06-05 NOTE — Progress Notes (Signed)
Hematology and Oncology Follow Up Visit  [name] [id number] [birth year] 49 y.o. 06/05/2020   Principle Diagnosis:  Chronic phase CML- major molecular remission  Current Therapy:        Sprycel 50 mg by mouth daily   Interim History:  [name] is here today for follow-up. She is doing well but is still having issues with her older kids. This is just such a hard time for kids to grow up. She is doing such a great job and is being very supportive.  They have started running some for exercise and are adjusting to the colder temperatures.  She is doing well on her Sprycel and taking as prescribed.  BCR-ABL is pending. Last level 6 months ago was 0.0069%.  No episodes of bleeding. No bruising or petechiae.  No fever, chills, n/v, cough, rash, dizziness, SOB, chest pain, palpitations, abdominal pain/bloating or changes in bowel or bladder habits.  No hot flashes or night sweats.  No swelling or tenderness in her extremities.  She has what sounds like intermittent positional numbness and tingling in her hands at times.  No falls or syncope.  She has maintained a good appetite and is doing her best to stay well hydrated. Her weight is stable.   ECOG Performance Status: 1 - Symptomatic but completely ambulatory  Medications:  Allergies as of 06/05/2020   No Known Allergies     Medication List       Accurate as of June 05, 2020  2:05 PM. If you have any questions, ask your nurse or doctor.        cholecalciferol 1000 units tablet Commonly known as: VITAMIN D Take 1,000 Units by mouth daily.   Premarin vaginal cream Generic drug: conjugated estrogens 1 Applicatorful as needed.   Sprycel 50 MG tablet Generic drug: dasatinib TAKE 1 TABLET (50 MG TOTAL) BY MOUTH DAILY.   Vitamins/Minerals Tabs Take by mouth daily.       Allergies: No Known Allergies  Past Medical History, Surgical history, Social history, and Family History were reviewed and updated.  Review  of Systems: All other 10 point review of systems is negative.   Physical Exam:  weight is 184 lb (83.5 kg). Her oral temperature is 98 F (36.7 C). Her blood pressure is 117/72 and her pulse is 63. Her oxygen saturation is 100%.   Wt Readings from Last 3 Encounters:  06/05/20 184 lb (83.5 kg)  12/04/19 184 lb 6.4 oz (83.6 kg)  09/18/19 193 lb 4 oz (87.7 kg)    Ocular: Sclerae unicteric, pupils equal, round and reactive to light Ear-nose-throat: Oropharynx clear, dentition fair Lymphatic: No cervical or supraclavicular adenopathy Lungs no rales or rhonchi, good excursion bilaterally Heart regular rate and rhythm, no murmur appreciated Abd soft, nontender, positive bowel sounds MSK no focal spinal tenderness, no joint edema Neuro: non-focal, well-oriented, appropriate affect Breasts: Deferred   Lab Results  Component Value Date   WBC 3.8 (L) 06/05/2020   HGB 12.1 06/05/2020   HCT 37.8 06/05/2020   MCV 93.3 06/05/2020   PLT 192 06/05/2020   Lab Results  Component Value Date   FERRITIN 158 12/30/2016   IRON 49 12/30/2016   TIBC 285 12/30/2016   UIBC 236 12/30/2016   IRONPCTSAT 17 (L) 12/30/2016   Lab Results  Component Value Date   RBC 4.05 06/05/2020   No results found for: KPAFRELGTCHN, LAMBDASER, KAPLAMBRATIO No results found for: IGGSERUM, IGA, IGMSERUM No results found for: TOTALPROTELP, ALBUMINELP, A1GS, A2GS, BETS,  Truddie Crumble, [place]   Chemistry      Component Value Date/Time   NA 137 06/05/2020 1319   NA 143 08/10/2017 1518   K 4.4 06/05/2020 1319   K 4.2 08/10/2017 1518   CL 101 06/05/2020 1319   CL 106 08/10/2017 1518   CO2 30 06/05/2020 1319   CO2 31 08/10/2017 1518   BUN 24 (H) 06/05/2020 1319   BUN 21 08/10/2017 1518   CREATININE 0.92 06/05/2020 1319   CREATININE 0.8 08/10/2017 1518      Component Value Date/Time   CALCIUM 10.2 06/05/2020 1319   CALCIUM 9.2 08/10/2017 1518   ALKPHOS 57 06/05/2020 1319   ALKPHOS 65 08/10/2017 1518    AST 14 (L) 06/05/2020 1319   ALT 10 06/05/2020 1319   ALT 24 08/10/2017 1518   BILITOT 0.4 06/05/2020 1319       Impression and Plan: [name] is a very pleasant 49 yo Hispanic female with chronic phase CML in MMR. She continues to do well. Hopefully things at home with her kids will get easier.  BCR-ABL is pending.  We will go ahead and plan for follow-up in 6 months.  She was encouraged to contact our office with any questions or concerns.   Laverna Peace, NP 10/22/20212:05 PM

## 2020-06-08 ENCOUNTER — Telehealth: Payer: Self-pay | Admitting: Family

## 2020-06-08 NOTE — Telephone Encounter (Signed)
Appointments scheduled calendar printed & mailed per 10/22 los

## 2020-06-10 MED FILL — SPRYCEL 50 MG TABLET: 50 | 30 days supply | Qty: 30 | Fill #9

## 2020-06-11 ENCOUNTER — Telehealth: Payer: Self-pay | Admitting: *Deleted

## 2020-06-11 LAB — BCR/ABL

## 2020-06-11 NOTE — Telephone Encounter (Signed)
Unable to reach pt. lmovm with results. Encouraged pt to call with any concerns.

## 2020-06-11 NOTE — Telephone Encounter (Signed)
-----   Message from Lauren Napoleon, MD sent at 06/11/2020  3:21 PM EDT ----- Call - the CML is in remission!!!  Happy Thanksgiving!!!  [name]

## 2020-07-01 ENCOUNTER — Encounter: Payer: Self-pay | Admitting: Internal Medicine

## 2020-07-03 MED FILL — SPRYCEL 50 MG TABLET: 50 | 30 days supply | Qty: 30 | Fill #10

## 2020-07-13 ENCOUNTER — Encounter: Payer: Self-pay | Admitting: Internal Medicine

## 2020-07-13 DIAGNOSIS — Z780 Asymptomatic menopausal state: Secondary | ICD-10-CM

## 2020-07-15 ENCOUNTER — Telehealth: Payer: Self-pay

## 2020-07-15 NOTE — Telephone Encounter (Signed)
Cologuard ordered through [employer] portal.

## 2020-07-27 MED FILL — SPRYCEL 50 MG TABLET: 50 | 30 days supply | Qty: 30 | Fill #11

## 2020-08-03 ENCOUNTER — Telehealth: Payer: Self-pay | Admitting: Pharmacy Technician

## 2020-08-03 NOTE — Telephone Encounter (Signed)
Oral Oncology Patient Advocate Encounter  Received fax notification from [facility] that prior authorization for Sprycel is required.  PA questionnaire completed and faxed on 07/29/20.  Received approval fax on 07/31/20. Approval dates: 07/31/20-07/31/21  Oral Oncology Clinic will continue to follow.  [facility] Phone [phone] Fax [phone] 08/03/2020 8:48 AM

## 2020-09-07 MED FILL — SPRYCEL 50 MG TABLET: 50 | 30 days supply | Qty: 30 | Fill #12

## 2020-09-30 ENCOUNTER — Encounter: Payer: Self-pay | Admitting: Internal Medicine

## 2020-09-30 ENCOUNTER — Other Ambulatory Visit: Payer: Self-pay

## 2020-09-30 ENCOUNTER — Ambulatory Visit (INDEPENDENT_AMBULATORY_CARE_PROVIDER_SITE_OTHER): Payer: BC Managed Care – PPO | Admitting: Internal Medicine

## 2020-09-30 VITALS — BP 116/80 | HR 68 | Temp 97.9°F | Resp 16 | Ht 67.0 in | Wt 189.1 lb

## 2020-09-30 DIAGNOSIS — R739 Hyperglycemia, unspecified: Secondary | ICD-10-CM

## 2020-09-30 DIAGNOSIS — E559 Vitamin D deficiency, unspecified: Secondary | ICD-10-CM | POA: Diagnosis not present

## 2020-09-30 DIAGNOSIS — Z0000 Encounter for general adult medical examination without abnormal findings: Secondary | ICD-10-CM | POA: Diagnosis not present

## 2020-09-30 DIAGNOSIS — Z780 Asymptomatic menopausal state: Secondary | ICD-10-CM

## 2020-09-30 LAB — LIPID PANEL
Cholesterol: 226 mg/dL — ABNORMAL HIGH (ref 0–200)
HDL: 87.8 mg/dL (ref >39.00)
LDL Cholesterol: 128 mg/dL — ABNORMAL HIGH (ref 0–99)
NonHDL: 138.62
Total CHOL/HDL Ratio: 3
Triglycerides: 55 mg/dL (ref 0.0–149.0)
VLDL: 11 mg/dL (ref 0.0–40.0)

## 2020-09-30 LAB — HEMOGLOBIN A1C: Hgb A1c MFr Bld: 5.6 % (ref 4.6–6.5)

## 2020-09-30 LAB — TSH: TSH: 3.47 u[IU]/mL (ref 0.35–4.50)

## 2020-09-30 LAB — VITAMIN D 25 HYDROXY (VIT D DEFICIENCY, FRACTURES): VITD: 19.3 ng/mL — ABNORMAL LOW (ref 30.00–100.00)

## 2020-09-30 NOTE — Patient Instructions (Addendum)
Consider immunization called Shingrix (again shingles), check your insurance. GO TO THE LAB : Get the blood work  [name], [name] Come back for   a physical exam in 1 year  STOP BY THE FIRST FLOOR: Schedule a bone density test   Advance Directive  Advance directives are legal documents that allow you to make decisions about your health care and medical treatment in case you become unable to communicate for yourself. Advance directives let your wishes be known to family, friends, and health care providers. Discussing and writing advance directives should happen over time rather than all at once. Advance directives can be changed and updated at any time. There are different types of advance directives, such as:  Medical power of attorney.  Living will.  Do not resuscitate (DNR) order or do not attempt resuscitation (DNAR) order. Health care proxy and medical power of attorney A health care proxy is also called a health care agent. This person is appointed to make medical decisions for you when you are unable to make decisions for yourself. Generally, people ask a trusted friend or family member to act as their proxy and represent their preferences. Make sure you have an agreement with your trusted person to act as your proxy. A proxy may have to make a medical decision on your behalf if your wishes are not known. A medical power of attorney, also called a durable power of attorney for health care, is a legal document that names your health care proxy. Depending on the laws in your state, the document may need to be:  Signed.  Notarized.  Dated.  Copied.  Witnessed.  Incorporated into your medical record. You may also want to appoint a trusted person to manage your money in the event you are unable to do so. This is called a durable power of attorney for finances. It is a separate legal document from the durable power of attorney for health care.  You may choose your health care proxy or someone different to act as your agent in money matters. If you do not appoint a proxy, or there is a concern that the proxy is not acting in your best interest, a court may appoint a guardian to act on your behalf. Living will A living will is a set of instructions that state your wishes about medical care when you cannot express them yourself. Health care providers should keep a copy of your living will in your medical record. You may want to give a copy to family members or friends. To alert caregivers in case of an emergency, you can place a card in your wallet to let them know that you have a living will and where they can find it. A living will is used if you become:  Terminally ill.  Disabled.  Unable to communicate or make decisions. The following decisions should be included in your living will:  To use or not to use life support equipment, such as dialysis machines and breathing machines (ventilators).  Whether you want a DNR or DNAR order. This tells health care providers not to use cardiopulmonary resuscitation (CPR) if breathing or heartbeat stops.  To use or not to use tube feeding.  To be given or not to be given food and fluids.  Whether you want comfort (palliative) care when the goal becomes comfort rather than a cure.  Whether you want to donate your organs and tissues. A living will does not give  instructions for distributing your money and property if you should pass away. DNR or DNAR A DNR or DNAR order is a request not to have CPR in the event that your heart stops beating or you stop breathing. If a DNR or DNAR order has not been made and shared, a health care provider will try to help any patient whose heart has stopped or who has stopped breathing. If you plan to have surgery, talk with your health care provider about how your DNR or DNAR order will be followed if problems occur. What if I do not have an advance  directive? Some states assign family decision makers to act on your behalf if you do not have an advance directive. Each state has its own laws about advance directives. You may want to check with your health care provider, attorney, or state representative about the laws in your state. Summary  Advance directives are legal documents that allow you to make decisions about your health care and medical treatment in case you become unable to communicate for yourself.  The process of discussing and writing advance directives should happen over time. You can change and update advance directives at any time.  Advance directives may include a medical power of attorney, a living will, and a DNR or DNAR order. This information is not intended to replace advice given to you by your health care provider. Make sure you discuss any questions you have with your health care provider. Document Revised: 05/05/2020 Document Reviewed: 05/05/2020 Elsevier Patient Education  2021 [employer].

## 2020-09-30 NOTE — Progress Notes (Signed)
Subjective:    Patient ID: [name], female    DOB: [birth year], 50 y.o.   MRN: [id number]  DOS:  09/30/2020 Type of visit - description: CPX Since the last office visit she is doing very well physically. She continue with challenges at home, ++ stress but is taking proactive steps to manage them including psychotherapy.   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 If you have any questions, ask your nurse or doctor.        cholecalciferol 1000 units tablet Commonly known as: VITAMIN D Take 1,000 Units by mouth daily.   Premarin vaginal cream Generic drug: conjugated estrogens 1 Applicatorful as needed.   Sprycel 50 MG tablet Generic drug: dasatinib TAKE 1 TABLET (50 MG TOTAL) BY MOUTH DAILY.   Vitamins/Minerals Tabs Take by mouth daily.          Objective:   Physical Exam BP 116/80 (BP Location: Right Arm, Patient Position: Sitting, Cuff Size: Small)    Pulse 68    Temp 97.9 F (36.6 C) (Oral)    Resp 16    Ht 5\' 7"  (1.702 m)    Wt 189 lb 2 oz (85.8 kg)    SpO2 96%    BMI 29.62 kg/m  General: Well developed, NAD, BMI noted Neck: No  thyromegaly  HEENT:  Normocephalic . Face symmetric, atraumatic Lungs:  CTA B Normal respiratory effort, no intercostal retractions, no accessory muscle use. Heart: RRR,  no murmur.   Abdomen:  Not distended, soft, non-tender. No rebound or rigidity.  Palpable nontender aorta Lower extremities: no pretibial edema bilaterally  Skin: Exposed areas without rash. Not pale. Not jaundice Neurologic:  alert & oriented X3.  Speech normal, gait appropriate for age and unassisted Strength symmetric and appropriate for age.  Psych: Cognition and judgment appear intact.  Cooperative with normal attention span and concentration.  Behavior appropriate. Slightly tearful at times     Assessment      Assessment  Anxiety depression --- dx 2014 ADD dx 2015 , rx  meds 2015 , self d/c ~ 04-2015  CML ---  DX 2013 + PPD, s/p treatment, had a BCG Glaucoma suspect BTL Menopausal age 21 Chronic dermatitis (perianal, GU area) has tried different medications, currently on hydrocortisone as needed   PLAN  Here for CPX Anxiety depression: Multiple issues at home, daughter was very depressed at some point, Lota is doing psychotherapy and managing the problem. Listening therapy provided. CML: Seems a stable RTC 1 year, sooner if needed    This visit occurred during the SARS-CoV-2 public health emergency.  Safety protocols were in place, including  screening questions prior to the visit, additional usage of staff PPE, and extensive cleaning of exam room while observing appropriate contact time as indicated for disinfecting solutions.

## 2020-09-30 NOTE — Progress Notes (Signed)
Pre visit review using our clinic review tool, if applicable. No additional management support is needed unless otherwise documented below in the visit note. 

## 2020-10-01 ENCOUNTER — Encounter: Payer: Self-pay | Admitting: Internal Medicine

## 2020-10-01 LAB — HEPATITIS C ANTIBODY
Hepatitis C Ab: NONREACTIVE
SIGNAL TO CUT-OFF: 0.01 (ref <1.00)

## 2020-10-01 NOTE — Assessment & Plan Note (Signed)
Here for CPX Anxiety depression: Multiple issues at home, daughter was very depressed at some point, Rima is doing psychotherapy and managing the problem. Listening therapy provided. CML: Seems a stable RTC 1 year, sooner if needed

## 2020-10-01 NOTE — Assessment & Plan Note (Signed)
-  Td 08/2018 - pnm 23 2017; prevnar 04-2016 - covid vax x 3 -  Had a  flu shot   -Female care per gyn, PAP  and MMG 03-2020 ([facility]) -CCS: No previous colonoscopy, 3 modalities discussed before, pt has a Cologuard kit and plans to send it soon. -[place] Ao 05-2014 neg for AAA -Menopausal, age 50, rx DEXA   -Diet-exercise discussed, doing well -Advance directive: Information provided -Available labs reviewed, will get FLP, A1c, TSH, vitamin D, hep C

## 2020-10-04 LAB — COLOGUARD: Cologuard: NEGATIVE

## 2020-10-05 ENCOUNTER — Other Ambulatory Visit: Payer: Self-pay

## 2020-10-05 DIAGNOSIS — C9210 Chronic myeloid leukemia, BCR/ABL-positive, not having achieved remission: Secondary | ICD-10-CM

## 2020-10-05 DIAGNOSIS — D849 Immunodeficiency, unspecified: Secondary | ICD-10-CM

## 2020-10-05 NOTE — Addendum Note (Signed)
Addended by: Tora Kindred on: 10/05/2020 09:28 AM   Modules accepted: Orders

## 2020-10-06 ENCOUNTER — Other Ambulatory Visit: Payer: Self-pay | Admitting: Hematology & Oncology

## 2020-10-07 ENCOUNTER — Telehealth: Payer: Self-pay | Admitting: Adult Health

## 2020-10-07 NOTE — Telephone Encounter (Signed)
Called patient and LMOM to return my call about Evusheld injection for COVID 19 pre exposure prophylaxis as she is at increased risk secondary to hematologic malignancy (CML) on active treatment with Sprycel.    Wilber Bihari, NP

## 2020-10-12 ENCOUNTER — Other Ambulatory Visit: Payer: Self-pay

## 2020-10-12 DIAGNOSIS — Z1211 Encounter for screening for malignant neoplasm of colon: Secondary | ICD-10-CM

## 2020-10-12 LAB — COLOGUARD: Cologuard: NEGATIVE

## 2020-10-15 MED FILL — SPRYCEL 50 MG TABLET: 50 | 30 days supply | Qty: 30 | Fill #0

## 2020-10-16 ENCOUNTER — Encounter: Payer: Self-pay | Admitting: Hematology & Oncology

## 2020-10-20 MED FILL — SPRYCEL 50 MG TABLET: 50 | 30 days supply | Qty: 30 | Fill #0

## 2020-10-21 ENCOUNTER — Encounter: Payer: Self-pay | Admitting: *Deleted

## 2020-11-05 ENCOUNTER — Other Ambulatory Visit (HOSPITAL_COMMUNITY): Payer: Self-pay

## 2020-11-09 MED FILL — SPRYCEL 50 MG TABLET: 50 | 30 days supply | Qty: 30 | Fill #1

## 2020-11-23 ENCOUNTER — Ambulatory Visit (HOSPITAL_BASED_OUTPATIENT_CLINIC_OR_DEPARTMENT_OTHER)
Admission: RE | Admit: 2020-11-23 | Discharge: 2020-11-23 | Disposition: A | Discharge status: Home / Self Care | Payer: BC Managed Care – PPO | Source: Ambulatory Visit | Attending: Internal Medicine | Admitting: Internal Medicine

## 2020-11-23 ENCOUNTER — Other Ambulatory Visit: Payer: Self-pay

## 2020-11-23 DIAGNOSIS — Z780 Asymptomatic menopausal state: Secondary | ICD-10-CM | POA: Insufficient documentation

## 2020-12-03 ENCOUNTER — Other Ambulatory Visit (HOSPITAL_COMMUNITY): Payer: Self-pay

## 2020-12-03 MED FILL — Dasatinib Tab 50 MG: ORAL | 30 days supply | Qty: 30 | Fill #0 | Status: AC

## 2020-12-04 ENCOUNTER — Inpatient Hospital Stay (HOSPITAL_BASED_OUTPATIENT_CLINIC_OR_DEPARTMENT_OTHER): Payer: BC Managed Care – PPO | Admitting: Family

## 2020-12-04 ENCOUNTER — Inpatient Hospital Stay: Payer: BC Managed Care – PPO | Attending: Hematology & Oncology

## 2020-12-04 ENCOUNTER — Other Ambulatory Visit: Payer: Self-pay

## 2020-12-04 VITALS — BP 101/55 | HR 61 | Temp 97.9°F | Resp 17 | Wt 191.0 lb

## 2020-12-04 DIAGNOSIS — C9210 Chronic myeloid leukemia, BCR/ABL-positive, not having achieved remission: Secondary | ICD-10-CM | POA: Insufficient documentation

## 2020-12-04 DIAGNOSIS — D508 Other iron deficiency anemias: Secondary | ICD-10-CM | POA: Diagnosis not present

## 2020-12-04 DIAGNOSIS — Z79899 Other long term (current) drug therapy: Secondary | ICD-10-CM | POA: Diagnosis not present

## 2020-12-04 LAB — CBC WITH DIFFERENTIAL (CANCER CENTER ONLY)
Abs Immature Granulocytes: 0.01 10*3/uL (ref 0.00–0.07)
Basophils Absolute: 0 10*3/uL (ref 0.0–0.1)
Basophils Relative: 1 %
Eosinophils Absolute: 0.1 10*3/uL (ref 0.0–0.5)
Eosinophils Relative: 2 %
HCT: 34.6 % — ABNORMAL LOW (ref 36.0–46.0)
Hemoglobin: 11.4 g/dL — ABNORMAL LOW (ref 12.0–15.0)
Immature Granulocytes: 0 %
Lymphocytes Relative: 48 %
Lymphs Abs: 1.5 10*3/uL (ref 0.7–4.0)
MCH: 29.7 pg (ref 26.0–34.0)
MCHC: 32.9 g/dL (ref 30.0–36.0)
MCV: 90.1 fL (ref 80.0–100.0)
Monocytes Absolute: 0.2 10*3/uL (ref 0.1–1.0)
Monocytes Relative: 8 %
Neutro Abs: 1.3 10*3/uL — ABNORMAL LOW (ref 1.7–7.7)
Neutrophils Relative %: 41 %
Platelet Count: 182 10*3/uL (ref 150–400)
RBC: 3.84 MIL/uL — ABNORMAL LOW (ref 3.87–5.11)
RDW: 12.9 % (ref 11.5–15.5)
WBC Count: 3.1 10*3/uL — ABNORMAL LOW (ref 4.0–10.5)
nRBC: 0 % (ref 0.0–0.2)

## 2020-12-04 LAB — CMP (CANCER CENTER ONLY)
ALT: 13 U/L (ref 0–44)
AST: 17 U/L (ref 15–41)
Albumin: 4.2 g/dL (ref 3.5–5.0)
Alkaline Phosphatase: 54 U/L (ref 38–126)
Anion gap: 4 — ABNORMAL LOW (ref 5–15)
BUN: 22 mg/dL — ABNORMAL HIGH (ref 6–20)
CO2: 31 mmol/L (ref 22–32)
Calcium: 9.7 mg/dL (ref 8.9–10.3)
Chloride: 102 mmol/L (ref 98–111)
Creatinine: 0.81 mg/dL (ref 0.44–1.00)
GFR, Estimated: >60 mL/min (ref >60)
Glucose, Bld: 94 mg/dL (ref 70–99)
Potassium: 4.9 mmol/L (ref 3.5–5.1)
Sodium: 137 mmol/L (ref 135–145)
Total Bilirubin: 0.4 mg/dL (ref 0.3–1.2)
Total Protein: 6.9 g/dL (ref 6.5–8.1)

## 2020-12-04 LAB — SAVE SMEAR(SSMR), FOR PROVIDER SLIDE REVIEW

## 2020-12-04 NOTE — Progress Notes (Signed)
Hematology and Oncology Follow Up Visit  [name] [id number] 1970-10-26 50 y.o. 12/04/2020   Principle Diagnosis:  Chronic phase CML- major molecular remission  Current Therapy: Sprycel 50 mg by mouth daily   Interim History:  [name] is here today for follow-up. She is doing well staying quite active with her 2 kids. They walk often and go horseback riding.  She has occasional fatigue and takes a break to rest if needed.  BCR-ABL was not detected at last visit. Today's result is pending.  No blood loss noted. No bruising or petechiae.  She has occasional hot flash and night sweats which she attributes to the change of life.  No adenopathy or lymphedema noted.  No fever, chills, n/v, cough, rash, dizziness, SOB, chest pain, palpitations, abdominal pain or changes in bowel or bladder habits.  No swelling, tenderness, numbness or tingling in her extremities.  No falls or syncope to report.  She has maintained a good appetite but admits that she needs to better hydrate throughout the day. Her weight is stable at 191 lbs.   ECOG Performance Status: 1 - Symptomatic but completely ambulatory  Medications:  Allergies as of 12/04/2020      Reactions   Chocolate Flavor Hives      Medication List       Accurate as of December 04, 2020  1:24 PM. If you have any questions, ask your nurse or doctor.        cholecalciferol 1000 units tablet Commonly known as: VITAMIN D Take 1,000 Units by mouth daily.   Premarin vaginal cream Generic drug: conjugated estrogens 1 Applicatorful as needed.   Sprycel 50 MG tablet Generic drug: dasatinib TAKE 1 TABLET (50 MG TOTAL) BY MOUTH DAILY.   Vitamins/Minerals Tabs Take by mouth daily.       Allergies:  Allergies  Allergen Reactions  . Chocolate Flavor Hives    Past Medical History, Surgical history, Social history, and Family History were reviewed and updated.  Review of Systems: All other 10 point review of systems  is negative.   Physical Exam:  vitals were not taken for this visit.   Wt Readings from Last 3 Encounters:  09/30/20 189 lb 2 oz (85.8 kg)  06/05/20 184 lb (83.5 kg)  12/04/19 184 lb 6.4 oz (83.6 kg)    Ocular: Sclerae unicteric, pupils equal, round and reactive to light Ear-nose-throat: Oropharynx clear, dentition fair Lymphatic: No cervical or supraclavicular adenopathy Lungs no rales or rhonchi, good excursion bilaterally Heart regular rate and rhythm, no murmur appreciated Abd soft, nontender, positive bowel sounds MSK no focal spinal tenderness, no joint edema Neuro: non-focal, well-oriented, appropriate affect Breasts: Deferred   Lab Results  Component Value Date   WBC 3.8 (L) 06/05/2020   HGB 12.1 06/05/2020   HCT 37.8 06/05/2020   MCV 93.3 06/05/2020   PLT 192 06/05/2020   Lab Results  Component Value Date   FERRITIN 158 12/30/2016   IRON 49 12/30/2016   TIBC 285 12/30/2016   UIBC 236 12/30/2016   IRONPCTSAT 17 (L) 12/30/2016   Lab Results  Component Value Date   RBC 4.05 06/05/2020   No results found for: KPAFRELGTCHN, LAMBDASER, KAPLAMBRATIO No results found for: IGGSERUM, IGA, IGMSERUM No results found for: Ronnald Ramp, A1GS, A2GS, Violet Baldy, MSPIKE, SPEI   Chemistry      Component Value Date/Time   NA 137 06/05/2020 1319   NA 143 08/10/2017 1518   K 4.4 06/05/2020 1319   K  4.2 08/10/2017 1518   CL 101 06/05/2020 1319   CL 106 08/10/2017 1518   CO2 30 06/05/2020 1319   CO2 31 08/10/2017 1518   BUN 24 (H) 06/05/2020 1319   BUN 21 08/10/2017 1518   CREATININE 0.92 06/05/2020 1319   CREATININE 0.8 08/10/2017 1518      Component Value Date/Time   CALCIUM 10.2 06/05/2020 1319   CALCIUM 9.2 08/10/2017 1518   ALKPHOS 57 06/05/2020 1319   ALKPHOS 65 08/10/2017 1518   AST 14 (L) 06/05/2020 1319   ALT 10 06/05/2020 1319   ALT 24 08/10/2017 1518   BILITOT 0.4 06/05/2020 1319       Impression and Plan: [name] is a very  pleasant 50yo Hispanic female with chronic phase CML in MMR. She will continue her same regimen with Sprycel.  BCR-ABL is pending. Will adjust treatment if needed based on results.  Iron studies pending.  Follow-up in 6 months.  She can contact our office with any questions or concerns.   Laverna Peace, NP 4/22/20221:24 PM

## 2020-12-07 ENCOUNTER — Other Ambulatory Visit (HOSPITAL_COMMUNITY): Payer: Self-pay

## 2020-12-07 ENCOUNTER — Telehealth: Payer: Self-pay

## 2020-12-07 LAB — FERRITIN: Ferritin: 211 ng/mL (ref 11–307)

## 2020-12-07 LAB — IRON AND TIBC
Iron: 59 ug/dL (ref 41–142)
Saturation Ratios: 19 % — ABNORMAL LOW (ref 21–57)
TIBC: 304 ug/dL (ref 236–444)
UIBC: 245 ug/dL (ref 120–384)

## 2020-12-07 NOTE — Telephone Encounter (Signed)
S/w pt and she is aware of her appts per 12/04/20 los   [employer]

## 2020-12-08 ENCOUNTER — Other Ambulatory Visit: Payer: Self-pay | Admitting: Family

## 2020-12-08 DIAGNOSIS — D509 Iron deficiency anemia, unspecified: Secondary | ICD-10-CM

## 2020-12-09 ENCOUNTER — Telehealth: Payer: Self-pay | Admitting: *Deleted

## 2020-12-09 NOTE — Telephone Encounter (Signed)
Per scheduling message 12/08/20 Judson Roch - called and gave upcoming appointment

## 2020-12-14 ENCOUNTER — Inpatient Hospital Stay: Payer: BC Managed Care – PPO | Attending: Family

## 2020-12-14 ENCOUNTER — Other Ambulatory Visit: Payer: Self-pay

## 2020-12-14 ENCOUNTER — Encounter: Payer: Self-pay | Admitting: *Deleted

## 2020-12-14 VITALS — BP 107/70 | HR 63 | Temp 98.2°F | Resp 17

## 2020-12-14 DIAGNOSIS — D509 Iron deficiency anemia, unspecified: Secondary | ICD-10-CM | POA: Insufficient documentation

## 2020-12-14 DIAGNOSIS — C9210 Chronic myeloid leukemia, BCR/ABL-positive, not having achieved remission: Secondary | ICD-10-CM | POA: Diagnosis present

## 2020-12-14 LAB — BCR/ABL

## 2020-12-14 MED ORDER — SODIUM CHLORIDE 0.9 % IV SOLN
125.0000 mg | Freq: Once | INTRAVENOUS | Status: AC
  Administered 2020-12-14: 125 mg via INTRAVENOUS
  Filled 2020-12-14: qty 10

## 2020-12-14 MED ORDER — SODIUM CHLORIDE 0.9 % IV SOLN
Freq: Once | INTRAVENOUS | Status: AC
  Filled 2020-12-14: qty 250

## 2020-12-14 NOTE — Patient Instructions (Signed)
Sodium Ferric Gluconate Complex injection What is this medicine? SODIUM FERRIC GLUCONATE COMPLEX (SOE dee um FER ik GLOO koe nate KOM pleks) is an iron replacement. It is used with epoetin therapy to treat low iron levels in patients who are receiving hemodialysis. This medicine may be used for other purposes; ask your health care provider or pharmacist if you have questions. COMMON BRAND NAME(S): Ferrlecit, Nulecit What should I tell my health care provider before I take this medicine? They need to know if you have any of the following conditions:  anemia that is not from iron deficiency  high levels of iron in the body  an unusual or allergic reaction to iron, benzyl alcohol, other medicines, foods, dyes, or preservatives  pregnant or are trying to become pregnant  breast-feeding How should I use this medicine? This medicine is for infusion into a vein. It is given by a health care professional in a hospital or clinic setting. Talk to your pediatrician regarding the use of this medicine in children. While this drug may be prescribed for children as young as 6 years old for selected conditions, precautions do apply. Overdosage: If you think you have taken too much of this medicine contact a poison control center or emergency room at once. NOTE: This medicine is only for you. Do not share this medicine with others. What if I miss a dose? It is important not to miss your dose. Call your doctor or health care professional if you are unable to keep an appointment. What may interact with this medicine? Do not take this medicine with any of the following medications:  deferoxamine  dimercaprol  other iron products This medicine may also interact with the following medications:  chloramphenicol  deferasirox  medicine for blood pressure like enalapril This list may not describe all possible interactions. Give your health care provider a list of all the medicines, herbs,  non-prescription drugs, or dietary supplements you use. Also tell them if you smoke, drink alcohol, or use illegal drugs. Some items may interact with your medicine. What should I watch for while using this medicine? Your condition will be monitored carefully while you are receiving this medicine. Visit your doctor for check-ups as directed. What side effects may I notice from receiving this medicine? Side effects that you should report to your doctor or health care professional as soon as possible:  allergic reactions like skin rash, itching or hives, swelling of the face, lips, or tongue  breathing problems  changes in hearing  changes in vision  chills, flushing, or sweating  fast, irregular heartbeat  feeling faint or lightheaded, falls  fever, flu-like symptoms  high or low blood pressure  pain, tingling, numbness in the hands or feet  severe pain in the chest, back, flanks, or groin  swelling of the ankles, feet, hands  trouble passing urine or change in the amount of urine  unusually weak or tired Side effects that usually do not require medical attention (report to your doctor or health care professional if they continue or are bothersome):  cramps  dark colored stools  diarrhea  headache  nausea, vomiting  stomach upset This list may not describe all possible side effects. Call your doctor for medical advice about side effects. You may report side effects to FDA at 1-800-FDA-1088. Where should I keep my medicine? This drug is given in a hospital or clinic and will not be stored at home. NOTE: This sheet is a summary. It may not cover all   possible information. If you have questions about this medicine, talk to your doctor, pharmacist, or health care provider.  2021 Elsevier/Gold Standard (2008-04-02 15:58:57)   

## 2020-12-14 NOTE — Progress Notes (Signed)
Pt refused to stay for 30 minutes post iron infusion and is without complaints at time of discharge.

## 2021-01-01 ENCOUNTER — Other Ambulatory Visit (HOSPITAL_COMMUNITY): Payer: Self-pay

## 2021-01-01 MED FILL — Dasatinib Tab 50 MG: ORAL | 30 days supply | Qty: 30 | Fill #1 | Status: CN

## 2021-01-07 ENCOUNTER — Other Ambulatory Visit (HOSPITAL_COMMUNITY): Payer: Self-pay

## 2021-01-07 MED FILL — Dasatinib Tab 50 MG: ORAL | 30 days supply | Qty: 30 | Fill #1 | Status: AC

## 2021-02-02 ENCOUNTER — Other Ambulatory Visit (HOSPITAL_COMMUNITY): Payer: Self-pay

## 2021-02-03 ENCOUNTER — Other Ambulatory Visit (HOSPITAL_COMMUNITY): Payer: Self-pay

## 2021-02-03 MED FILL — Dasatinib Tab 50 MG: ORAL | 30 days supply | Qty: 30 | Fill #2 | Status: AC

## 2021-02-11 ENCOUNTER — Other Ambulatory Visit (HOSPITAL_COMMUNITY): Payer: Self-pay

## 2021-02-27 ENCOUNTER — Encounter: Payer: Self-pay | Admitting: Family

## 2021-03-08 ENCOUNTER — Other Ambulatory Visit (HOSPITAL_COMMUNITY): Payer: Self-pay

## 2021-03-08 MED FILL — Dasatinib Tab 50 MG: ORAL | 30 days supply | Qty: 30 | Fill #3 | Status: AC

## 2021-03-31 LAB — HM MAMMOGRAPHY

## 2021-04-05 ENCOUNTER — Other Ambulatory Visit (HOSPITAL_COMMUNITY): Payer: Self-pay

## 2021-04-07 ENCOUNTER — Other Ambulatory Visit (HOSPITAL_COMMUNITY): Payer: Self-pay

## 2021-04-07 MED FILL — Dasatinib Tab 50 MG: ORAL | 30 days supply | Qty: 30 | Fill #4 | Status: AC

## 2021-04-08 ENCOUNTER — Other Ambulatory Visit (HOSPITAL_COMMUNITY): Payer: Self-pay

## 2021-04-30 ENCOUNTER — Encounter: Payer: Self-pay | Admitting: Internal Medicine

## 2021-05-06 ENCOUNTER — Other Ambulatory Visit (HOSPITAL_COMMUNITY): Payer: Self-pay

## 2021-05-10 ENCOUNTER — Other Ambulatory Visit (HOSPITAL_COMMUNITY): Payer: Self-pay

## 2021-05-10 MED FILL — Dasatinib Tab 50 MG: ORAL | 30 days supply | Qty: 30 | Fill #5 | Status: AC

## 2021-05-12 ENCOUNTER — Other Ambulatory Visit (HOSPITAL_COMMUNITY): Payer: Self-pay

## 2021-05-30 ENCOUNTER — Encounter: Payer: Self-pay | Admitting: Internal Medicine

## 2021-06-02 ENCOUNTER — Encounter: Payer: Self-pay | Admitting: Family

## 2021-06-04 ENCOUNTER — Inpatient Hospital Stay: Payer: BC Managed Care – PPO | Attending: Hematology & Oncology

## 2021-06-04 ENCOUNTER — Other Ambulatory Visit: Payer: Self-pay

## 2021-06-04 ENCOUNTER — Ambulatory Visit: Payer: BC Managed Care – PPO | Admitting: Internal Medicine

## 2021-06-04 ENCOUNTER — Telehealth: Payer: Self-pay | Admitting: *Deleted

## 2021-06-04 ENCOUNTER — Encounter: Payer: Self-pay | Admitting: Family

## 2021-06-04 ENCOUNTER — Inpatient Hospital Stay: Payer: BC Managed Care – PPO | Admitting: Family

## 2021-06-04 ENCOUNTER — Encounter: Payer: Self-pay | Admitting: Internal Medicine

## 2021-06-04 VITALS — BP 108/68 | HR 58 | Temp 98.1°F | Resp 16 | Ht 67.0 in | Wt 193.0 lb

## 2021-06-04 VITALS — BP 105/61 | HR 61 | Temp 98.3°F | Resp 18 | Ht 67.0 in | Wt 193.8 lb

## 2021-06-04 DIAGNOSIS — F419 Anxiety disorder, unspecified: Secondary | ICD-10-CM | POA: Diagnosis not present

## 2021-06-04 DIAGNOSIS — F32A Depression, unspecified: Secondary | ICD-10-CM | POA: Diagnosis not present

## 2021-06-04 DIAGNOSIS — D509 Iron deficiency anemia, unspecified: Secondary | ICD-10-CM | POA: Diagnosis not present

## 2021-06-04 DIAGNOSIS — C9211 Chronic myeloid leukemia, BCR/ABL-positive, in remission: Secondary | ICD-10-CM | POA: Diagnosis not present

## 2021-06-04 DIAGNOSIS — Z79899 Other long term (current) drug therapy: Secondary | ICD-10-CM | POA: Insufficient documentation

## 2021-06-04 DIAGNOSIS — Z23 Encounter for immunization: Secondary | ICD-10-CM

## 2021-06-04 DIAGNOSIS — D508 Other iron deficiency anemias: Secondary | ICD-10-CM

## 2021-06-04 DIAGNOSIS — C9210 Chronic myeloid leukemia, BCR/ABL-positive, not having achieved remission: Secondary | ICD-10-CM | POA: Diagnosis not present

## 2021-06-04 LAB — CBC WITH DIFFERENTIAL (CANCER CENTER ONLY)
Abs Immature Granulocytes: 0.01 10*3/uL (ref 0.00–0.07)
Basophils Absolute: 0 10*3/uL (ref 0.0–0.1)
Basophils Relative: 1 %
Eosinophils Absolute: 0.1 10*3/uL (ref 0.0–0.5)
Eosinophils Relative: 2 %
HCT: 38.3 % (ref 36.0–46.0)
Hemoglobin: 12.7 g/dL (ref 12.0–15.0)
Immature Granulocytes: 0 %
Lymphocytes Relative: 40 %
Lymphs Abs: 1.4 10*3/uL (ref 0.7–4.0)
MCH: 30.2 pg (ref 26.0–34.0)
MCHC: 33.2 g/dL (ref 30.0–36.0)
MCV: 91 fL (ref 80.0–100.0)
Monocytes Absolute: 0.3 10*3/uL (ref 0.1–1.0)
Monocytes Relative: 7 %
Neutro Abs: 1.8 10*3/uL (ref 1.7–7.7)
Neutrophils Relative %: 50 %
Platelet Count: 204 10*3/uL (ref 150–400)
RBC: 4.21 MIL/uL (ref 3.87–5.11)
RDW: 13.1 % (ref 11.5–15.5)
WBC Count: 3.7 10*3/uL — ABNORMAL LOW (ref 4.0–10.5)
nRBC: 0 % (ref 0.0–0.2)

## 2021-06-04 LAB — CMP (CANCER CENTER ONLY)
ALT: 11 U/L (ref 0–44)
AST: 16 U/L (ref 15–41)
Albumin: 4.5 g/dL (ref 3.5–5.0)
Alkaline Phosphatase: 55 U/L (ref 38–126)
Anion gap: 7 (ref 5–15)
BUN: 20 mg/dL (ref 6–20)
CO2: 28 mmol/L (ref 22–32)
Calcium: 9.9 mg/dL (ref 8.9–10.3)
Chloride: 104 mmol/L (ref 98–111)
Creatinine: 0.79 mg/dL (ref 0.44–1.00)
GFR, Estimated: >60 mL/min (ref >60)
Glucose, Bld: 89 mg/dL (ref 70–99)
Potassium: 4.1 mmol/L (ref 3.5–5.1)
Sodium: 139 mmol/L (ref 135–145)
Total Bilirubin: 0.4 mg/dL (ref 0.3–1.2)
Total Protein: 7.1 g/dL (ref 6.5–8.1)

## 2021-06-04 LAB — IRON AND TIBC
Iron: 75 ug/dL (ref 28–170)
Saturation Ratios: 21 % (ref 10.4–31.8)
TIBC: 358 ug/dL (ref 250–450)
UIBC: 283 ug/dL

## 2021-06-04 LAB — FERRITIN: Ferritin: 247 ng/mL (ref 11–307)

## 2021-06-04 LAB — SAVE SMEAR(SSMR), FOR PROVIDER SLIDE REVIEW

## 2021-06-04 MED ORDER — FLUOXETINE HCL 10 MG PO CAPS: ORAL_CAPSULE | ORAL | 3 refills | Status: DC

## 2021-06-04 NOTE — Patient Instructions (Addendum)
Recommend to proceed with new covid booster at your pharmacy  Continue counseling  Start fluoxetine  Come back in 6 weeks for recheck.

## 2021-06-04 NOTE — Telephone Encounter (Signed)
Per 06/04/21 los - gave upcoming appointments - confirmed

## 2021-06-04 NOTE — Progress Notes (Signed)
Subjective:    Patient ID: [name], female    DOB: 12/25/1970, 50 y.o.   MRN: [id number]  DOS:  06/04/2021 Type of visit - description: acute  Her chief complaint today is stress. Her daughter who is 49 y/o become depressed during the pandemia, now is on psychotherapy and medications. The relationship with her husband continue to be very conflictive and unhealthy. They have decided to separate.  Denies suicidal ideas. Sleeping okay as long as she takes melatonin Frequent crying.  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 If you have any questions, ask your nurse or doctor.          STOP taking these medications    Premarin vaginal cream Generic drug: conjugated estrogens Stopped by: Lottie Dawson, NP       TAKE these medications    calcium carbonate 1250 (500 Ca) MG tablet Commonly known as: OS-CAL - dosed in mg of elemental calcium Take 1 tablet by mouth.   cholecalciferol 1000 units tablet Commonly known as: VITAMIN D Take 1,000 Units by mouth daily.   FLUoxetine 10 MG capsule Commonly known as: PROZAC 1 tablet daily x10 days, then 2 tablets daily Started by: Kathlene November, MD   Sprycel 50 MG tablet Generic drug: dasatinib TAKE 1 TABLET (50 MG TOTAL) BY MOUTH DAILY.   Vitamins/Minerals Tabs Take by mouth daily.           Objective:   Physical Exam BP 108/68 (BP Location: Left Arm, Patient Position: Sitting, Cuff Size: Small)   Pulse (!) 58   Temp 98.1 F (36.7 C) (Oral)   Resp 16   Ht 5\' 7"   (1.702 m)   Wt 193 lb (87.5 kg)   SpO2 98%   BMI 30.23 kg/m  General:   Well developed,  BMI noted. HEENT:  Normocephalic . Face symmetric, atraumatic Lower extremities: no pretibial edema bilaterally  Skin: Not pale. Not jaundice Neurologic:  alert & oriented X3.  Speech normal, gait appropriate for age and unassisted Psych--  Tearful when we talk about her family.     Assessment     Assessment  Anxiety depression --- dx 2014 ADD dx 2015 , rx  meds 2015 , self d/c ~ 04-2015  CML ---  DX 2013 + PPD, s/p treatment, had a BCG Glaucoma suspect BTL Menopausal age 60 Chronic dermatitis (perianal, GU area) has tried different medications, currently on hydrocortisone as needed   PLAN Anxiety depression: Relationship with her husband continue to be very conflictive, they decided to separate, her daughter has developed depression. Pt's  depression has increased, requests medication. Denies suicidal ideas. Listening therapy provided. We agreed to start fluoxetine 10 mg daily, then 20 mg daily.  Potential for side effects including suicidal ideas discussed. RTC 6 weeks.   This visit occurred during the SARS-CoV-2 public health emergency.  Safety protocols were in  place, including screening questions prior to the visit, additional usage of staff PPE, and extensive cleaning of exam room while observing appropriate contact time as indicated for disinfecting solutions.

## 2021-06-04 NOTE — Progress Notes (Signed)
Hematology and Oncology Follow Up Visit  [name] [id number] 14-Sep-1970 50 y.o. 06/04/2021   Principle Diagnosis:  Chronic phase CML- major molecular remission   Current Therapy:        Sprycel 50 mg by mouth daily   Interim History:  [name] is here today for follow-up. She is doing fairly well but does have a lot of stress at home and work right now. She has a therapist that she is talking to and has an appointment with her PCP to discuss medication. She is a little tearful during our visit today. No thoughts of harming herself.  She has been in MMR since October 2021. Today's BCR/ABL result pending.  No fever, chills, n/v, cough, rash, dizziness, SOB, chest pain, palpitations, abdominal pain or changes in bowel or bladder habits.  No swelling, tenderness, numbness or tingling in her extremities at this time.  No falls or syncope to report.  Her appetite has been ok and is staying well hydrated. Her weight is stable at 193 lbs.   ECOG Performance Status: 0 - Asymptomatic  Medications:  Allergies as of 06/04/2021       Reactions   Chocolate Flavor Hives        Medication List        Accurate as of June 04, 2021 11:17 AM. If you have any questions, ask your nurse or doctor.          STOP taking these medications    Premarin vaginal cream Generic drug: conjugated estrogens Stopped by: Lottie Dawson, NP       TAKE these medications    cholecalciferol 1000 units tablet Commonly known as: VITAMIN D Take 1,000 Units by mouth daily.   Sprycel 50 MG tablet Generic drug: dasatinib TAKE 1 TABLET (50 MG TOTAL) BY MOUTH DAILY.   Vitamins/Minerals Tabs Take by mouth daily.        Allergies:  Allergies  Allergen Reactions   Chocolate Flavor Hives    Past Medical History, Surgical history, Social history, and Family History were reviewed and updated.  Review of Systems: All other 10 point review of systems is negative.   Physical Exam:   height is _0  (1.702 m) and weight is 193 lb 12.8 oz (87.9 kg). Her oral temperature is 98.3 F (36.8 C). Her blood pressure is 105/61 and her pulse is 61. Her respiration is 18 and oxygen saturation is 100%.   Wt Readings from Last 3 Encounters:  06/04/21 193 lb 12.8 oz (87.9 kg)  12/04/20 191 lb (86.6 kg)  09/30/20 189 lb 2 oz (85.8 kg)    Ocular: Sclerae unicteric, pupils equal, round and reactive to light Ear-nose-throat: Oropharynx clear, dentition fair Lymphatic: No cervical or supraclavicular adenopathy Lungs no rales or rhonchi, good excursion bilaterally Heart regular rate and rhythm, no murmur appreciated Abd soft, nontender, positive bowel sounds MSK no focal spinal tenderness, no joint edema Neuro: non-focal, well-oriented, appropriate affect Breasts: Deferred   Lab Results  Component Value Date   WBC 3.7 (L) 06/04/2021   HGB 12.7 06/04/2021   HCT 38.3 06/04/2021   MCV 91.0 06/04/2021   PLT 204 06/04/2021   Lab Results  Component Value Date   FERRITIN 211 12/04/2020   IRON 59 12/04/2020   TIBC 304 12/04/2020   UIBC 245 12/04/2020   IRONPCTSAT 19 (L) 12/04/2020   Lab Results  Component Value Date   RBC 4.21 06/04/2021   No results found for: KPAFRELGTCHN, LAMBDASER, KAPLAMBRATIO No results found  for: Kandis Cocking, IGMSERUM No results found for: Odetta Pink, SPEI   Chemistry      Component Value Date/Time   NA 139 06/04/2021 1021   NA 143 08/10/2017 1518   K 4.1 06/04/2021 1021   K 4.2 08/10/2017 1518   CL 104 06/04/2021 1021   CL 106 08/10/2017 1518   CO2 28 06/04/2021 1021   CO2 31 08/10/2017 1518   BUN 20 06/04/2021 1021   BUN 21 08/10/2017 1518   CREATININE 0.79 06/04/2021 1021   CREATININE 0.8 08/10/2017 1518      Component Value Date/Time   CALCIUM 9.9 06/04/2021 1021   CALCIUM 9.2 08/10/2017 1518   ALKPHOS 55 06/04/2021 1021   ALKPHOS 65 08/10/2017 1518   AST 16 06/04/2021 1021    ALT 11 06/04/2021 1021   ALT 24 08/10/2017 1518   BILITOT 0.4 06/04/2021 1021       Impression and Plan: [name] is a very pleasant 50 yo Hispanic female with chronic phase CML in MMR. She continues to do well on Sprycel.  We would like her to be in MMR for 2 years before considering giving her a break from treatment per MD.  BCR/ABL is pending today.  Follow-up in 6 months.  She can contact our office with any questions or concerns.   Lottie Dawson, NP 10/21/202211:17 AM

## 2021-06-06 NOTE — Assessment & Plan Note (Signed)
Anxiety depression: Relationship with her husband continue to be very conflictive, they decided to separate, her daughter has developed depression. Pt's  depression has increased, requests medication. Denies suicidal ideas. Listening therapy provided. We agreed to start fluoxetine 10 mg daily, then 20 mg daily.  Potential for side effects including suicidal ideas discussed. RTC 6 weeks.

## 2021-06-08 ENCOUNTER — Other Ambulatory Visit (HOSPITAL_COMMUNITY): Payer: Self-pay

## 2021-06-08 MED FILL — Dasatinib Tab 50 MG: ORAL | 30 days supply | Qty: 30 | Fill #6 | Status: AC

## 2021-06-09 ENCOUNTER — Other Ambulatory Visit (HOSPITAL_COMMUNITY): Payer: Self-pay

## 2021-06-14 LAB — BCR/ABL

## 2021-06-15 ENCOUNTER — Encounter: Payer: Self-pay | Admitting: *Deleted

## 2021-07-05 ENCOUNTER — Telehealth: Payer: Self-pay | Admitting: Pharmacy Technician

## 2021-07-05 NOTE — Telephone Encounter (Signed)
Oral Oncology Patient Advocate Encounter  Prior Authorization for Sprycel has been approved.    PA# [id number] Effective dates: 07/03/21 through 07/03/22  Oral Oncology Clinic will continue to follow.   [place] [facility] [place] Phone [phone] Fax [phone] 07/05/2021 8:24 AM

## 2021-07-05 NOTE — Telephone Encounter (Signed)
Oral Oncology Patient Advocate Encounter   Received notification from [facility] that the existing prior authorization for Sprycel is due for renewal.   Renewal PA faxed to [phone] on 07/02/21. Status is pending   Oral Oncology Clinic will continue to follow.  [facility] [place] Phone [phone] Fax [phone] 07/05/2021 8:24 AM

## 2021-07-06 ENCOUNTER — Encounter: Payer: Self-pay | Admitting: Internal Medicine

## 2021-07-07 ENCOUNTER — Other Ambulatory Visit (HOSPITAL_COMMUNITY): Payer: Self-pay

## 2021-07-07 ENCOUNTER — Ambulatory Visit: Payer: BC Managed Care – PPO | Admitting: Internal Medicine

## 2021-07-07 MED FILL — Dasatinib Tab 50 MG: ORAL | 30 days supply | Qty: 30 | Fill #7 | Status: AC

## 2021-07-13 ENCOUNTER — Other Ambulatory Visit (HOSPITAL_COMMUNITY): Payer: Self-pay

## 2021-08-04 ENCOUNTER — Ambulatory Visit: Payer: BC Managed Care – PPO | Admitting: Internal Medicine

## 2021-08-04 ENCOUNTER — Encounter: Payer: Self-pay | Admitting: Internal Medicine

## 2021-08-11 ENCOUNTER — Other Ambulatory Visit (HOSPITAL_COMMUNITY): Payer: Self-pay

## 2021-08-11 MED FILL — Dasatinib Tab 50 MG: ORAL | 30 days supply | Qty: 30 | Fill #8 | Status: AC

## 2021-08-25 ENCOUNTER — Encounter: Payer: Self-pay | Admitting: Internal Medicine

## 2021-08-25 MED ORDER — FLUOXETINE HCL 10 MG PO CAPS: 20.0000 mg | ORAL_CAPSULE | Freq: Every day | ORAL | 0 refills | Status: DC

## 2021-09-14 ENCOUNTER — Other Ambulatory Visit (HOSPITAL_COMMUNITY): Payer: Self-pay

## 2021-09-14 MED FILL — Dasatinib Tab 50 MG: ORAL | 30 days supply | Qty: 30 | Fill #9 | Status: AC

## 2021-09-15 ENCOUNTER — Other Ambulatory Visit (HOSPITAL_COMMUNITY): Payer: Self-pay

## 2021-10-01 ENCOUNTER — Encounter: Payer: Self-pay | Admitting: Internal Medicine

## 2021-10-01 ENCOUNTER — Ambulatory Visit (INDEPENDENT_AMBULATORY_CARE_PROVIDER_SITE_OTHER): Payer: BC Managed Care – PPO | Admitting: Internal Medicine

## 2021-10-01 VITALS — BP 126/78 | HR 70 | Temp 98.4°F | Resp 16 | Ht 67.0 in | Wt 199.5 lb

## 2021-10-01 DIAGNOSIS — Z0000 Encounter for general adult medical examination without abnormal findings: Secondary | ICD-10-CM | POA: Diagnosis not present

## 2021-10-01 DIAGNOSIS — E559 Vitamin D deficiency, unspecified: Secondary | ICD-10-CM

## 2021-10-01 DIAGNOSIS — Z23 Encounter for immunization: Secondary | ICD-10-CM | POA: Diagnosis not present

## 2021-10-01 DIAGNOSIS — R739 Hyperglycemia, unspecified: Secondary | ICD-10-CM

## 2021-10-01 DIAGNOSIS — F419 Anxiety disorder, unspecified: Secondary | ICD-10-CM | POA: Diagnosis not present

## 2021-10-01 DIAGNOSIS — F32A Depression, unspecified: Secondary | ICD-10-CM

## 2021-10-01 LAB — LIPID PANEL
Cholesterol: 235 mg/dL — ABNORMAL HIGH (ref 0–200)
HDL: 87.1 mg/dL
LDL Cholesterol: 135 mg/dL — ABNORMAL HIGH (ref 0–99)
NonHDL: 147.67
Total CHOL/HDL Ratio: 3
Triglycerides: 64 mg/dL (ref 0.0–149.0)
VLDL: 12.8 mg/dL (ref 0.0–40.0)

## 2021-10-01 LAB — VITAMIN D 25 HYDROXY (VIT D DEFICIENCY, FRACTURES): VITD: 17.32 ng/mL — ABNORMAL LOW (ref 30.00–100.00)

## 2021-10-01 LAB — TSH: TSH: 3.24 u[IU]/mL (ref 0.35–5.50)

## 2021-10-01 LAB — HEMOGLOBIN A1C: Hgb A1c MFr Bld: 5.7 % (ref 4.6–6.5)

## 2021-10-01 MED ORDER — FLUOXETINE HCL 10 MG PO CAPS: 30.0000 mg | ORAL_CAPSULE | Freq: Every day | ORAL | 1 refills | Status: DC

## 2021-10-01 NOTE — Patient Instructions (Addendum)
Increase fluoxetine 10 mg to 3 tablets a day.  Let me know in 6 weeks how that is working for you.  You are getting your first Shingrix shot today, next is anytime between 2 and 4 months.  Please make an appointment   GO TO THE LAB : Get the blood work     [place], [place] back for a checkup in 6 months

## 2021-10-01 NOTE — Progress Notes (Signed)
Subjective:    Patient ID: [name], female    DOB: 06-07-1971, 51 y.o.   MRN: [id number]  DOS:  10/01/2021 Type of visit - description: CPX  Here for CPX. In general she feels well. At the last visit started fluoxetine, it is helping, still have days that she feels somewhat depressed.  Wonders about an increase dose.  No suicidal ideas.   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)    Past Surgical History:  Procedure Laterality Date   CESAREAN SECTION  2006 & 2012   TUBAL LIGATION     Social History   Socioeconomic History   Marital status: Legally Separated    Spouse name: Bradly Chris   Number of children: 2   Years of education: Not on file   Highest education level: Not on file  Occupational History   Occupation: TEACHER    Employer: [employer]  Tobacco Use   Smoking status: Never   Smokeless tobacco: Never   Tobacco comments:    never used tobacco  Vaping Use   Vaping Use: Never used  Substance and Sexual Activity   Alcohol use: No    Alcohol/week: 0.0 standard drinks   Drug use: No   Sexual activity: Yes    Partners: Male  Other Topics Concern   Not on file  Social History Narrative   From [place]    Separated from Husband 2022   2 children :   female  2006, female  2012      Social Determinants of Health   Financial Resource Strain: Not on file  Food Insecurity: Not on file  Transportation Needs: Not on file  Physical Activity: Not on file  Stress: Not on file  Social Connections: Not on file  Intimate Partner Violence: Not on file     Current Outpatient  Medications  Medication Instructions   calcium carbonate (OS-CAL - DOSED IN MG OF ELEMENTAL CALCIUM) 1250 (500 Ca) MG tablet 1 tablet, Oral   cholecalciferol (VITAMIN D) 1,000 Units, Oral, Daily   dasatinib (SPRYCEL) 50 MG tablet TAKE 1 TABLET (50 MG TOTAL) BY MOUTH DAILY.   Ferrous Gluconate-C-Folic Acid (IRON-C PO) Oral   FLUoxetine (PROZAC) 30 mg, Oral, Daily   Vitamins/Minerals TABS Oral, Daily       Objective:   Physical Exam BP 126/78 (BP Location: Left Arm, Patient Position: Sitting, Cuff Size: Small)    Pulse 70    Temp 98.4 F (36.9 C) (Oral)    Resp 16    Ht 5\' 7"  (1.702 m)    Wt 199 lb 8 oz (90.5 kg)    SpO2 97%    BMI 31.25 kg/m  General: Well developed, NAD, BMI noted Neck: No  thyromegaly  HEENT:  Normocephalic . Face symmetric, atraumatic Lungs:  CTA B Normal respiratory effort, no intercostal retractions, no accessory muscle use. Heart: RRR,  no murmur.  Abdomen:  Not distended, soft, non-tender. No rebound or rigidity.   Lower extremities: no pretibial edema bilaterally  Skin: Exposed areas without rash. Not pale. Not jaundice Neurologic:  alert & oriented X3.  Speech normal, gait appropriate for age and unassisted Strength symmetric and appropriate for age.  Psych: Cognition and judgment appear intact.  Cooperative with normal attention span and concentration.  Behavior appropriate. No anxious or depressed appearing.     Assessment     Assessment  Anxiety depression --- dx 2014 ADD dx 2015 , rx  meds 2015 , self d/c ~ 04-2015  CML ---  DX 2013 + PPD, s/p treatment, had a BCG Glaucoma suspect BTL Menopausal age 23 Chronic dermatitis (perianal, GU area) has tried different medications, currently on hydrocortisone as needed Osteopenia T score -1.1 (11-2020)  PLAN Here for CPX Anxiety depression: At the last visit the started fluoxetine, she also sees a therapist.  Still feeling depressed at times, no suicidal ideas.  Plan: cont w/ psychotherapy,  increase fluoxetine to 30 mg a day, she will let me know in 6 weeks how she is feeling. Osteopenia: On calcium, vitamin D, encourage exercise. Vitamin D deficiency: On OTCs, checking labs CML: Closely follow-up by hematology RTC 6 months    This visit occurred during the SARS-CoV-2 public health emergency.  Safety protocols were in place, including screening questions prior to the visit, additional usage of staff PPE, and extensive cleaning of exam room while observing appropriate contact time as indicated for disinfecting solutions.

## 2021-10-03 ENCOUNTER — Encounter: Payer: Self-pay | Admitting: Internal Medicine

## 2021-10-03 NOTE — Assessment & Plan Note (Signed)
-  Td 08/2018 - shingrix d/w pt, no interactions with Sprycel, proceed with #1 today, next in 2 to 4 months. - pnm 23 2017; prevnar 04-2016 - covid vax : had a booster 07-2021 -  Had a  flu shot   -Female care per gyn, PAP  and [facility] 03-2021 ([facility]) -CCS: No previous colonoscopy, Cologuard neg 08-2020 -[place] Ao 05-2014 neg for AAA -Menopausal, age 51    -Diet-exercise : Has gained few pounds, diet and exercise encourage -Available labs reviewed, will get FLP, A1c, TSH, vitamin D

## 2021-10-03 NOTE — Assessment & Plan Note (Signed)
Here for CPX Anxiety depression: At the last visit the started fluoxetine, she also sees a therapist.  Still feeling depressed at times, no suicidal ideas.  Plan: cont w/ psychotherapy, increase fluoxetine to 30 mg a day, she will let me know in 6 weeks how she is feeling. Osteopenia: On calcium, vitamin D, encourage exercise. Vitamin D deficiency: On OTCs, checking labs CML: Closely follow-up by hematology RTC 6 months

## 2021-10-06 ENCOUNTER — Encounter: Payer: Self-pay | Admitting: Internal Medicine

## 2021-10-06 MED ORDER — VITAMIN D (ERGOCALCIFEROL) 1.25 MG (50000 UNIT) PO CAPS: 50000.0000 [IU] | ORAL_CAPSULE | ORAL | 0 refills | Status: DC

## 2021-10-06 NOTE — Addendum Note (Signed)
Addended byDamita Dunnings D on: 10/06/2021 07:50 AM   Modules accepted: Orders

## 2021-10-12 ENCOUNTER — Other Ambulatory Visit: Payer: Self-pay | Admitting: Hematology & Oncology

## 2021-10-12 ENCOUNTER — Other Ambulatory Visit (HOSPITAL_COMMUNITY): Payer: Self-pay

## 2021-10-13 ENCOUNTER — Encounter: Payer: Self-pay | Admitting: Family

## 2021-10-13 ENCOUNTER — Other Ambulatory Visit (HOSPITAL_COMMUNITY): Payer: Self-pay

## 2021-10-13 MED ORDER — DASATINIB 50 MG PO TABS
ORAL_TABLET | Freq: Every day | ORAL | 12 refills | Status: DC
  Filled 2021-10-13: qty 30, 30d supply, fill #0
  Filled 2021-11-12: qty 30, 30d supply, fill #1
  Filled 2021-12-15: qty 30, 30d supply, fill #2
  Filled 2022-01-11: qty 30, 30d supply, fill #3
  Filled 2022-02-10: qty 30, 30d supply, fill #4
  Filled 2022-03-10: qty 30, 30d supply, fill #5
  Filled 2022-04-12: qty 30, 30d supply, fill #6
  Filled 2022-05-09: qty 30, 30d supply, fill #7
  Filled 2022-06-08: qty 30, 30d supply, fill #8
  Filled 2022-07-06: qty 30, 30d supply, fill #9
  Filled 2022-08-12: qty 30, 30d supply, fill #10
  Filled 2022-09-13: qty 30, 30d supply, fill #11

## 2021-10-19 ENCOUNTER — Other Ambulatory Visit (HOSPITAL_COMMUNITY): Payer: Self-pay

## 2021-10-20 ENCOUNTER — Other Ambulatory Visit (HOSPITAL_COMMUNITY): Payer: Self-pay

## 2021-10-26 ENCOUNTER — Other Ambulatory Visit (HOSPITAL_COMMUNITY): Payer: Self-pay

## 2021-10-26 ENCOUNTER — Encounter: Payer: Self-pay | Admitting: Family

## 2021-11-12 ENCOUNTER — Other Ambulatory Visit (HOSPITAL_COMMUNITY): Payer: Self-pay

## 2021-11-24 ENCOUNTER — Other Ambulatory Visit (HOSPITAL_COMMUNITY): Payer: Self-pay

## 2021-11-26 ENCOUNTER — Other Ambulatory Visit: Payer: Self-pay

## 2021-11-26 ENCOUNTER — Inpatient Hospital Stay: Payer: BC Managed Care – PPO | Admitting: Hematology & Oncology

## 2021-11-26 ENCOUNTER — Encounter: Payer: Self-pay | Admitting: Hematology & Oncology

## 2021-11-26 ENCOUNTER — Inpatient Hospital Stay: Payer: BC Managed Care – PPO | Attending: Hematology & Oncology

## 2021-11-26 VITALS — BP 119/69 | HR 66 | Temp 98.1°F | Resp 18 | Wt 202.0 lb

## 2021-11-26 DIAGNOSIS — Z79899 Other long term (current) drug therapy: Secondary | ICD-10-CM | POA: Insufficient documentation

## 2021-11-26 DIAGNOSIS — D509 Iron deficiency anemia, unspecified: Secondary | ICD-10-CM

## 2021-11-26 DIAGNOSIS — C9211 Chronic myeloid leukemia, BCR/ABL-positive, in remission: Secondary | ICD-10-CM | POA: Insufficient documentation

## 2021-11-26 DIAGNOSIS — C9210 Chronic myeloid leukemia, BCR/ABL-positive, not having achieved remission: Secondary | ICD-10-CM

## 2021-11-26 LAB — CBC WITH DIFFERENTIAL (CANCER CENTER ONLY)
Abs Immature Granulocytes: 0.01 10*3/uL (ref 0.00–0.07)
Basophils Absolute: 0 10*3/uL (ref 0.0–0.1)
Basophils Relative: 1 %
Eosinophils Absolute: 0.1 10*3/uL (ref 0.0–0.5)
Eosinophils Relative: 3 %
HCT: 34.7 % — ABNORMAL LOW (ref 36.0–46.0)
Hemoglobin: 11.5 g/dL — ABNORMAL LOW (ref 12.0–15.0)
Immature Granulocytes: 0 %
Lymphocytes Relative: 38 %
Lymphs Abs: 1.4 10*3/uL (ref 0.7–4.0)
MCH: 30.4 pg (ref 26.0–34.0)
MCHC: 33.1 g/dL (ref 30.0–36.0)
MCV: 91.8 fL (ref 80.0–100.0)
Monocytes Absolute: 0.3 10*3/uL (ref 0.1–1.0)
Monocytes Relative: 7 %
Neutro Abs: 1.9 10*3/uL (ref 1.7–7.7)
Neutrophils Relative %: 51 %
Platelet Count: 169 10*3/uL (ref 150–400)
RBC: 3.78 MIL/uL — ABNORMAL LOW (ref 3.87–5.11)
RDW: 13.1 % (ref 11.5–15.5)
WBC Count: 3.8 10*3/uL — ABNORMAL LOW (ref 4.0–10.5)
nRBC: 0 % (ref 0.0–0.2)

## 2021-11-26 LAB — CMP (CANCER CENTER ONLY)
ALT: 18 U/L (ref 0–44)
AST: 18 U/L (ref 15–41)
Albumin: 4.3 g/dL (ref 3.5–5.0)
Alkaline Phosphatase: 49 U/L (ref 38–126)
Anion gap: 7 (ref 5–15)
BUN: 22 mg/dL — ABNORMAL HIGH (ref 6–20)
CO2: 27 mmol/L (ref 22–32)
Calcium: 9.1 mg/dL (ref 8.9–10.3)
Chloride: 105 mmol/L (ref 98–111)
Creatinine: 0.8 mg/dL (ref 0.44–1.00)
GFR, Estimated: >60 mL/min (ref >60)
Glucose, Bld: 96 mg/dL (ref 70–99)
Potassium: 4 mmol/L (ref 3.5–5.1)
Sodium: 139 mmol/L (ref 135–145)
Total Bilirubin: 0.4 mg/dL (ref 0.3–1.2)
Total Protein: 6.5 g/dL (ref 6.5–8.1)

## 2021-11-26 LAB — FERRITIN: Ferritin: 218 ng/mL (ref 11–307)

## 2021-11-26 LAB — IRON AND IRON BINDING CAPACITY (CC-WL,HP ONLY)
Iron: 63 ug/dL (ref 28–170)
Saturation Ratios: 19 % (ref 10.4–31.8)
TIBC: 330 ug/dL (ref 250–450)
UIBC: 267 ug/dL (ref 148–442)

## 2021-11-26 LAB — SAVE SMEAR(SSMR), FOR PROVIDER SLIDE REVIEW

## 2021-11-26 NOTE — Progress Notes (Signed)
?Hematology and Oncology Follow Up Visit ? ?[name] ?[id number] ?1970-12-04 51 y.o. ?11/26/2021 ? ? ?Principle Diagnosis:  ?Chronic phase CML- major molecular remission ?  ?Current Therapy:        ?Sprycel 50 mg by mouth daily ?  ?Interim History:  [name] is here today for follow-up.  Unfortunately, a lot is been going on in her life.  She has had a lot of personal issues.  I just feel bad about this.  She is doing her best to try to help her family and help her children. ? ?She now has poison ivy.  She was out working in the garden.  She has it under her left eye.  She has on her arms.  She went to the urgent care.  She says she got a injection.  I suspect this probably was steroid.  I told her to put some over-the-counter Ivy Dry on the lesions. ? ?As far as her CML is concerned, she is done incredibly well with this.  Her last BCR/ABL back in October was nondetectable. ? 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 ? ?She has had no change in bowel or bladder habits.  She is through the change of life. ? ?Overall, I would say performance status is probably ECOG 1.   ? ? ?Medications:  ?Allergies as of 11/26/2021   ? ?   Reactions  ? Chocolate Flavor Hives  ? ?  ? ?  ?Medication List  ?  ? ?  ? Accurate as of November 26, 2021  8:31 AM. If you have any questions, ask your nurse or doctor.  ?  ?  ? ?  ? ?calcium carbonate 1250 (500 Ca) MG tablet ?Commonly known as: OS-CAL - dosed in mg of elemental calcium ?Take 1 tablet by mouth. ?  ?cholecalciferol 1000 units tablet ?Commonly known as: VITAMIN D ?Take 1,000 Units by mouth daily. ?  ?FLUoxetine 10 MG capsule ?Commonly known as: PROZAC ?Take 3 capsules (30 mg total) by mouth daily. ?  ?IRON-C PO ?Take by mouth. ?  ?Sprycel 50 MG tablet ?Generic drug: dasatinib ?Tome 1 tableta (50 mg en total) por v?a oral diariamente. ?(TAKE 1 TABLET (50 MG TOTAL) BY MOUTH DAILY.) ?  ?Vitamin D (Ergocalciferol) 1.25 MG (50000 UNIT) Caps capsule ?Commonly known as:  DRISDOL ?Take 1 capsule (50,000 Units total) by mouth every 7 (seven) days. ?  ?Vitamins/Minerals Tabs ?Take by mouth daily. ?  ? ?  ? ? ?Allergies:  ?Allergies  ?Allergen Reactions  ? Chocolate Flavor Hives  ? ? ?Past Medical History, Surgical history, Social history, and Family History were reviewed and updated. ? ?Review of Systems: ?Review of Systems  ?Constitutional: Negative.   ?HENT: Negative.    ?Eyes:  Positive for discharge and redness.  ?Respiratory: Negative.    ?Cardiovascular: Negative.   ?Gastrointestinal: Negative.   ?Genitourinary: Negative.   ?Musculoskeletal: Negative.   ?Skin:  Positive for itching and rash.  ?Neurological: Negative.   ?Endo/Heme/Allergies: Negative.   ?Psychiatric/Behavioral: Negative.    ? ? ?Physical Exam: ? vitals were not taken for this visit.  ? ?Wt Readings from Last 3 Encounters:  ?10/01/21 199 lb 8 oz (90.5 kg)  ?06/04/21 193 lb (87.5 kg)  ?06/04/21 193 lb 12.8 oz (87.9 kg)  ?Her vital signs are temperature of 98.1.  Pulse 66.  Blood pressure 119/69.  Weight is 202 pounds. ? ?Physical Exam ?Vitals reviewed.  ?HENT:  ?   Head: Normocephalic and  atraumatic.  ?   Comments: Head and neck exam shows erythema and swelling under the left eye.  Maybe a little bit of erythema with the conjunctive of the left eye.  Her pupil reacts appropriately.  There is no adenopathy in the neck. ?Eyes:  ?   Pupils: Pupils are equal, round, and reactive to light.  ?Cardiovascular:  ?   Rate and Rhythm: Normal rate and regular rhythm.  ?   Heart sounds: Normal heart sounds.  ?Pulmonary:  ?   Effort: Pulmonary effort is normal.  ?   Breath sounds: Normal breath sounds.  ?Abdominal:  ?   General: Bowel sounds are normal.  ?   Palpations: Abdomen is soft.  ?Musculoskeletal:     ?   General: No tenderness or deformity. Normal range of motion.  ?   Cervical back: Normal range of motion.  ?Lymphadenopathy:  ?   Cervical: No cervical adenopathy.  ?Skin: ?   General: Skin is warm and dry.  ?   Findings:  No erythema or rash.  ?   Comments: She has some scattered papular type lesions on her arms secondary to the poison ivy.  ?Neurological:  ?   Mental Status: She is alert and oriented to person, place, and time.  ?Psychiatric:     ?   Behavior: Behavior normal.     ?   Thought Content: Thought content normal.     ?   Judgment: Judgment normal.  ? ? ? ?Lab Results  ?Component Value Date  ? WBC 3.8 (L) 11/26/2021  ? HGB 11.5 (L) 11/26/2021  ? HCT 34.7 (L) 11/26/2021  ? MCV 91.8 11/26/2021  ? PLT 169 11/26/2021  ? ?Lab Results  ?Component Value Date  ? FERRITIN 247 06/04/2021  ? IRON 75 06/04/2021  ? TIBC 358 06/04/2021  ? UIBC 283 06/04/2021  ? IRONPCTSAT 21 06/04/2021  ? ?Lab Results  ?Component Value Date  ? RBC 3.78 (L) 11/26/2021  ? ?No results found for: KPAFRELGTCHN, LAMBDASER, KAPLAMBRATIO ?No results found for: IGGSERUM, IGA, IGMSERUM ?No results found for: TOTALPROTELP, ALBUMINELP, A1GS, A2GS, BETS, BETA2SER, GAMS, MSPIKE, SPEI ?  Chemistry   ?   ?Component Value Date/Time  ? NA 139 06/04/2021 1021  ? NA 143 08/10/2017 1518  ? K 4.1 06/04/2021 1021  ? K 4.2 08/10/2017 1518  ? CL 104 06/04/2021 1021  ? CL 106 08/10/2017 1518  ? CO2 28 06/04/2021 1021  ? CO2 31 08/10/2017 1518  ? BUN 20 06/04/2021 1021  ? BUN 21 08/10/2017 1518  ? CREATININE 0.79 06/04/2021 1021  ? CREATININE 0.8 08/10/2017 1518  ?    ?Component Value Date/Time  ? CALCIUM 9.9 06/04/2021 1021  ? CALCIUM 9.2 08/10/2017 1518  ? ALKPHOS 55 06/04/2021 1021  ? ALKPHOS 65 08/10/2017 1518  ? AST 16 06/04/2021 1021  ? ALT 11 06/04/2021 1021  ? ALT 24 08/10/2017 1518  ? BILITOT 0.4 06/04/2021 1021  ?  ? ? ? ?Impression and Plan: [name] is a very pleasant 51 yo Hispanic female with chronic phase CML in MMR.  She did have a relapse back in 2017.  We have her on Sprycel for the relapse. ? ?So far, she looks like she is in a remission.We will see what the BCR/ABL ratio is.   ? ?I just feel bad that she is having issues at home.  Hopefully, she will be able  to get all this resolved. ? ?We will see what the BCR/ABL is. ? ?  Hopefully, the over-the-counter agent for her poison ivy will work. ? ?We will plan to get her back in 6 more months. ?  ? ?Volanda Napoleon, MD ?4/14/20238:31 AM ? ?

## 2021-12-07 LAB — BCR/ABL

## 2021-12-08 ENCOUNTER — Encounter: Payer: Self-pay | Admitting: *Deleted

## 2021-12-10 ENCOUNTER — Other Ambulatory Visit (HOSPITAL_COMMUNITY): Payer: Self-pay

## 2021-12-13 ENCOUNTER — Other Ambulatory Visit (HOSPITAL_COMMUNITY): Payer: Self-pay

## 2021-12-15 ENCOUNTER — Other Ambulatory Visit (HOSPITAL_COMMUNITY): Payer: Self-pay

## 2021-12-17 ENCOUNTER — Other Ambulatory Visit (HOSPITAL_COMMUNITY): Payer: Self-pay

## 2021-12-25 ENCOUNTER — Other Ambulatory Visit: Payer: Self-pay | Admitting: Internal Medicine

## 2021-12-29 ENCOUNTER — Other Ambulatory Visit: Payer: Self-pay | Admitting: Internal Medicine

## 2022-01-05 ENCOUNTER — Encounter: Payer: Self-pay | Admitting: Internal Medicine

## 2022-01-06 ENCOUNTER — Other Ambulatory Visit (HOSPITAL_COMMUNITY): Payer: Self-pay

## 2022-01-11 ENCOUNTER — Other Ambulatory Visit (HOSPITAL_COMMUNITY): Payer: Self-pay

## 2022-01-13 ENCOUNTER — Other Ambulatory Visit (HOSPITAL_COMMUNITY): Payer: Self-pay

## 2022-02-08 ENCOUNTER — Other Ambulatory Visit (HOSPITAL_COMMUNITY): Payer: Self-pay

## 2022-02-10 ENCOUNTER — Other Ambulatory Visit (HOSPITAL_COMMUNITY): Payer: Self-pay

## 2022-02-10 ENCOUNTER — Ambulatory Visit (INDEPENDENT_AMBULATORY_CARE_PROVIDER_SITE_OTHER): Payer: BC Managed Care – PPO

## 2022-02-10 DIAGNOSIS — Z23 Encounter for immunization: Secondary | ICD-10-CM | POA: Diagnosis not present

## 2022-02-14 ENCOUNTER — Other Ambulatory Visit (HOSPITAL_COMMUNITY): Payer: Self-pay

## 2022-03-10 ENCOUNTER — Other Ambulatory Visit (HOSPITAL_COMMUNITY): Payer: Self-pay

## 2022-03-16 ENCOUNTER — Other Ambulatory Visit (HOSPITAL_COMMUNITY): Payer: Self-pay

## 2022-04-08 ENCOUNTER — Encounter: Payer: Self-pay | Admitting: Internal Medicine

## 2022-04-12 ENCOUNTER — Other Ambulatory Visit (HOSPITAL_COMMUNITY): Payer: Self-pay

## 2022-04-14 ENCOUNTER — Other Ambulatory Visit (HOSPITAL_COMMUNITY): Payer: Self-pay

## 2022-04-19 ENCOUNTER — Other Ambulatory Visit (HOSPITAL_COMMUNITY): Payer: Self-pay

## 2022-05-09 ENCOUNTER — Other Ambulatory Visit (HOSPITAL_COMMUNITY): Payer: Self-pay

## 2022-05-16 ENCOUNTER — Encounter: Payer: Self-pay | Admitting: Internal Medicine

## 2022-05-19 ENCOUNTER — Other Ambulatory Visit (HOSPITAL_COMMUNITY): Payer: Self-pay

## 2022-05-23 ENCOUNTER — Ambulatory Visit: Payer: BC Managed Care – PPO | Admitting: Internal Medicine

## 2022-05-25 ENCOUNTER — Encounter: Payer: Self-pay | Admitting: Internal Medicine

## 2022-05-25 ENCOUNTER — Ambulatory Visit (INDEPENDENT_AMBULATORY_CARE_PROVIDER_SITE_OTHER): Payer: BC Managed Care – PPO | Admitting: Internal Medicine

## 2022-05-25 VITALS — BP 130/78 | HR 78 | Temp 98.4°F | Resp 18 | Ht 67.0 in | Wt 206.0 lb

## 2022-05-25 DIAGNOSIS — R21 Rash and other nonspecific skin eruption: Secondary | ICD-10-CM | POA: Diagnosis not present

## 2022-05-25 DIAGNOSIS — Z23 Encounter for immunization: Secondary | ICD-10-CM

## 2022-05-25 MED ORDER — BETAMETHASONE DIPROPIONATE AUG 0.05 % EX CREA: TOPICAL_CREAM | Freq: Two times a day (BID) | CUTANEOUS | 1 refills | Status: DC

## 2022-05-25 MED ORDER — FEXOFENADINE HCL 180 MG PO TABS: 180.0000 mg | ORAL_TABLET | Freq: Every day | ORAL | 1 refills | Status: DC

## 2022-05-25 NOTE — Progress Notes (Signed)
   Subjective:    Patient ID: [name], female    DOB: 02-Apr-1971, 51 y.o.   MRN: [id number]  DOS:  05/25/2022 Type of visit - description: acute  Here for eval of a rash, started 4 weeks ago.  It is extremely itchy. Her cats have fleas, she thinks they are fleabites.  Has not been in contact with poison ivy or poison oak.  Has not done yard work recently  Denies any systemic symptoms like fever chills, nausea vomiting or feeling unwell.   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   Ferrous Gluconate-C-Folic Acid (IRON-C PO) Oral   fexofenadine (ALLEGRA) 180 mg, Daily   FLUoxetine (PROZAC) 10 MG capsule TAKE 2 CAPSULES(20 MG) BY MOUTH DAILY   melatonin 3 mg, Oral, Daily PRN   predniSONE (STERAPRED UNI-PAK 48 TAB) 10 MG (48) TBPK tablet As directed   Vitamin D (Ergocalciferol) (DRISDOL) 50,000 Units, Oral, Every 7 days   Vitamins/Minerals TABS Oral, Daily       Objective:   Physical Exam BP 130/78   Pulse 78   Temp 98.4 F (36.9 C) (Oral)   Resp 18   Ht '5\' 7"'$  (1.702 m)   Wt 206 lb (93.4 kg)   SpO2 97%   BMI 32.26 kg/m  General:   Well developed, NAD, BMI noted. HEENT:  Normocephalic . Face symmetric, atraumatic Skin: Most of the skin lesions are located at the lower extremities in a sock distribution.  They are in different stages, some are fresh, others are dried up.  See  picture.  Very few lesions at the lower abdomen and upper extremity Neurologic:  alert & oriented X3.  Speech normal, gait appropriate for age and unassisted Psych--  Cognition and judgment appear intact.  Cooperative with normal attention span and concentration.  Behavior appropriate. No anxious or depressed appearing.       Assessment   Assessment  Anxiety depression --- dx 2014 ADD dx 2015 , rx  meds 2015 , self d/c ~ 04-2015  CML ---  DX 2013 + PPD, s/p treatment, had a BCG Glaucoma suspect BTL Menopausal age 46 Chronic dermatitis (perianal, GU area) has tried different medications, currently on hydrocortisone as needed Osteopenia T score -1.1 (11-2020)  PLAN Fleabites? Rash described above, her cat has fleas.  It is likely,  as the patient thinks, rash is d/t fleabites.  No systemic symptoms. Most of the skin lesions are in a sock distribution thus this could be also an allergic  reaction to socks (?) Plan: Topical steroids, restart fexofenadine, prescription sent.  Change socks, avoidance of contact with fleas.  Call if not better. CML: saw hem-onc 11-2021 Felt to be in stable. RTC for CPX 4 months

## 2022-05-25 NOTE — Patient Instructions (Addendum)
Apply the cream twice daily for 1 week  Avoid fleas  Call if not gradually better  Change your socks to Cortone only.  Call if not gradually better or if you have fever, chills or the reaction spreads.     GO TO THE FRONT DESK, PLEASE SCHEDULE YOUR APPOINTMENTS Come back for a physical exam by 09-2022

## 2022-05-25 NOTE — Assessment & Plan Note (Signed)
Fleabites? Rash described above, her cat has fleas.  It is likely,  as the patient thinks, rash is d/t fleabites.  No systemic symptoms. Most of the skin lesions are in a sock distribution thus this could be also an allergic  reaction to socks (?) Plan: Topical steroids, restart fexofenadine, prescription sent.  Change socks, avoidance of contact with fleas.  Call if not better. CML: saw hem-onc 11-2021 Felt to be in stable. RTC for CPX 4 months

## 2022-05-26 ENCOUNTER — Encounter: Payer: Self-pay | Admitting: Hematology & Oncology

## 2022-05-26 ENCOUNTER — Inpatient Hospital Stay: Payer: BC Managed Care – PPO | Attending: Hematology & Oncology

## 2022-05-26 ENCOUNTER — Other Ambulatory Visit: Payer: Self-pay

## 2022-05-26 ENCOUNTER — Inpatient Hospital Stay: Payer: BC Managed Care – PPO | Admitting: Hematology & Oncology

## 2022-05-26 VITALS — BP 112/69 | HR 71 | Temp 98.4°F | Resp 18 | Ht 67.0 in | Wt 206.1 lb

## 2022-05-26 DIAGNOSIS — C9212 Chronic myeloid leukemia, BCR/ABL-positive, in relapse: Secondary | ICD-10-CM | POA: Insufficient documentation

## 2022-05-26 DIAGNOSIS — Z79899 Other long term (current) drug therapy: Secondary | ICD-10-CM | POA: Diagnosis not present

## 2022-05-26 DIAGNOSIS — C9210 Chronic myeloid leukemia, BCR/ABL-positive, not having achieved remission: Secondary | ICD-10-CM

## 2022-05-26 LAB — CBC WITH DIFFERENTIAL (CANCER CENTER ONLY)
Abs Immature Granulocytes: 0.01 10*3/uL (ref 0.00–0.07)
Basophils Absolute: 0 10*3/uL (ref 0.0–0.1)
Basophils Relative: 1 %
Eosinophils Absolute: 0.1 10*3/uL (ref 0.0–0.5)
Eosinophils Relative: 3 %
HCT: 35.8 % — ABNORMAL LOW (ref 36.0–46.0)
Hemoglobin: 11.5 g/dL — ABNORMAL LOW (ref 12.0–15.0)
Immature Granulocytes: 0 %
Lymphocytes Relative: 30 %
Lymphs Abs: 1.1 10*3/uL (ref 0.7–4.0)
MCH: 29.8 pg (ref 26.0–34.0)
MCHC: 32.1 g/dL (ref 30.0–36.0)
MCV: 92.7 fL (ref 80.0–100.0)
Monocytes Absolute: 0.2 10*3/uL (ref 0.1–1.0)
Monocytes Relative: 7 %
Neutro Abs: 2.2 10*3/uL (ref 1.7–7.7)
Neutrophils Relative %: 59 %
Platelet Count: 184 10*3/uL (ref 150–400)
RBC: 3.86 MIL/uL — ABNORMAL LOW (ref 3.87–5.11)
RDW: 13.6 % (ref 11.5–15.5)
WBC Count: 3.7 10*3/uL — ABNORMAL LOW (ref 4.0–10.5)
nRBC: 0 % (ref 0.0–0.2)

## 2022-05-26 LAB — CMP (CANCER CENTER ONLY)
ALT: 16 U/L (ref 0–44)
AST: 20 U/L (ref 15–41)
Albumin: 4.4 g/dL (ref 3.5–5.0)
Alkaline Phosphatase: 55 U/L (ref 38–126)
Anion gap: 6 (ref 5–15)
BUN: 20 mg/dL (ref 6–20)
CO2: 29 mmol/L (ref 22–32)
Calcium: 9.5 mg/dL (ref 8.9–10.3)
Chloride: 105 mmol/L (ref 98–111)
Creatinine: 0.84 mg/dL (ref 0.44–1.00)
GFR, Estimated: >60 mL/min (ref >60)
Glucose, Bld: 107 mg/dL — ABNORMAL HIGH (ref 70–99)
Potassium: 4.4 mmol/L (ref 3.5–5.1)
Sodium: 140 mmol/L (ref 135–145)
Total Bilirubin: 0.3 mg/dL (ref 0.3–1.2)
Total Protein: 7.1 g/dL (ref 6.5–8.1)

## 2022-05-26 LAB — IRON AND IRON BINDING CAPACITY (CC-WL,HP ONLY)
Iron: 41 ug/dL (ref 28–170)
Saturation Ratios: 13 % (ref 10.4–31.8)
TIBC: 323 ug/dL (ref 250–450)
UIBC: 282 ug/dL (ref 148–442)

## 2022-05-26 LAB — SAVE SMEAR(SSMR), FOR PROVIDER SLIDE REVIEW

## 2022-05-26 LAB — FERRITIN: Ferritin: 170 ng/mL (ref 11–307)

## 2022-05-26 NOTE — Progress Notes (Signed)
Hematology and Oncology Follow Up Visit  [name] [id number] July 31, 1971 51 y.o. 05/26/2022   Principle Diagnosis:  Chronic phase CML- major molecular remission   Current Therapy:        Sprycel 50 mg by mouth daily   Interim History:  [name] is here today for follow-up.  She is doing pretty well.  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  She is quite busy.  She is doing well on the Sprycel.  Her last BCR/ABL was not detected.  As such, she is in a Major Molecular Remission.  She has had no problems with nausea or vomiting.  She did take in some rescue cats.  She reportedly had flea bites on her lower legs.  There is mostly on the left lower leg.  She has had no problems with her monthly cycles.  She is due for mammogram later this year.  She has had no cough or shortness of breath.  Thankfully, there is been no issues with COVID.  Overall, I would say that her performance status is ECOG 1.      Medications:  Allergies as of 05/26/2022       Reactions   Chocolate Flavor Hives        Medication List        Accurate as of May 26, 2022  1:25 PM. If you have any questions, ask your nurse or doctor.          augmented betamethasone dipropionate 0.05 % cream Commonly known as: DIPROLENE-AF Apply topically 2 (two) times daily.   calcium carbonate 1250 (500 Ca) MG tablet Commonly known as: OS-CAL - dosed in mg of elemental calcium Take 1 tablet by mouth.   cholecalciferol 1000 units tablet Commonly known as: VITAMIN D Take 1,000 Units by mouth daily.   fexofenadine 180 MG tablet Commonly known as: ALLEGRA Take 1 tablet (180 mg total) by mouth daily.   FLUoxetine 10 MG capsule Commonly known as: PROZAC TAKE 2 CAPSULES(20 MG) BY MOUTH DAILY   IRON-C PO Take by mouth.   melatonin 3 MG Tabs tablet Take 3 mg by mouth daily as needed.   Sprycel 50 MG tablet Generic drug:  dasatinib Tome 1 tableta (50 mg en total) por va oral diariamente. (TAKE 1 TABLET (50 MG TOTAL) BY MOUTH DAILY.)   Vitamins/Minerals Tabs Take by mouth daily.        Allergies:  Allergies  Allergen Reactions   Chocolate Flavor Hives    Past Medical History, Surgical history, Social history, and Family History were reviewed and updated.  Review of Systems: Review of Systems  Constitutional: Negative.   HENT: Negative.    Eyes:  Positive for discharge and redness.  Respiratory: Negative.    Cardiovascular: Negative.   Gastrointestinal: Negative.   Genitourinary: Negative.   Musculoskeletal: Negative.   Skin:  Positive for itching and rash.  Neurological: Negative.   Endo/Heme/Allergies: Negative.   Psychiatric/Behavioral: Negative.       Physical Exam:  height is '5\' 7"'  (1.702 m) and weight is 206 lb 1.9 oz (93.5 kg). Her oral temperature is 98.4 F (36.9 C). Her blood pressure is 112/69 and her pulse is 71. Her respiration is 18 and oxygen saturation is 100%.   Wt Readings from Last 3 Encounters:  05/26/22 206 lb 1.9 oz (93.5 kg)  05/25/22 206 lb (93.4 kg)  11/26/21 202 lb (  91.6 kg)  Her vital signs are temperature of 98.1.  Pulse 66.  Blood pressure 119/69.  Weight is 202 pounds.  Physical Exam Vitals reviewed.  HENT:     Head: Normocephalic and atraumatic.     Comments: Head and neck exam shows erythema and swelling under the left eye.  Maybe a little bit of erythema with the conjunctive of the left eye.  Her pupil reacts appropriately.  There is no adenopathy in the neck. Eyes:     Pupils: Pupils are equal, round, and reactive to light.  Cardiovascular:     Rate and Rhythm: Normal rate and regular rhythm.     Heart sounds: Normal heart sounds.  Pulmonary:     Effort: Pulmonary effort is normal.     Breath sounds: Normal breath sounds.  Abdominal:     General: Bowel sounds are normal.     Palpations: Abdomen is soft.  Musculoskeletal:        General: No  tenderness or deformity. Normal range of motion.     Cervical back: Normal range of motion.  Lymphadenopathy:     Cervical: No cervical adenopathy.  Skin:    General: Skin is warm and dry.     Findings: No erythema or rash.     Comments: She has some scattered papular type lesions on her arms secondary to the poison ivy.  Neurological:     Mental Status: She is alert and oriented to person, place, and time.  Psychiatric:        Behavior: Behavior normal.        Thought Content: Thought content normal.        Judgment: Judgment normal.     Lab Results  Component Value Date   WBC 3.7 (L) 05/26/2022   HGB 11.5 (L) 05/26/2022   HCT 35.8 (L) 05/26/2022   MCV 92.7 05/26/2022   PLT 184 05/26/2022   Lab Results  Component Value Date   FERRITIN 218 11/26/2021   IRON 63 11/26/2021   TIBC 330 11/26/2021   UIBC 267 11/26/2021   IRONPCTSAT 19 11/26/2021   Lab Results  Component Value Date   RBC 3.86 (L) 05/26/2022   No results found for: "KPAFRELGTCHN", "LAMBDASER", "KAPLAMBRATIO" No results found for: "IGGSERUM", "IGA", "IGMSERUM" No results found for: "TOTALPROTELP", "ALBUMINELP", "A1GS", "A2GS", "BETS", "BETA2SER", "GAMS", "MSPIKE", "SPEI"   Chemistry      Component Value Date/Time   NA 139 11/26/2021 0815   NA 143 08/10/2017 1518   K 4.0 11/26/2021 0815   K 4.2 08/10/2017 1518   CL 105 11/26/2021 0815   CL 106 08/10/2017 1518   CO2 27 11/26/2021 0815   CO2 31 08/10/2017 1518   BUN 22 (H) 11/26/2021 0815   BUN 21 08/10/2017 1518   CREATININE 0.80 11/26/2021 0815   CREATININE 0.8 08/10/2017 1518      Component Value Date/Time   CALCIUM 9.1 11/26/2021 0815   CALCIUM 9.2 08/10/2017 1518   ALKPHOS 49 11/26/2021 0815   ALKPHOS 65 08/10/2017 1518   AST 18 11/26/2021 0815   ALT 18 11/26/2021 0815   ALT 24 08/10/2017 1518   BILITOT 0.4 11/26/2021 0815       Impression and Plan: [name] is a very pleasant 51 yo Hispanic female with chronic phase CML in MMR.  She did  have a relapse back in 2017.  We have her on Sprycel for the relapse.  As always, I am very impressed with how well she is doing.  I would have to believe that she is in remission.  I think if she is in remission we will see her back in 2024, we might be able to stop the Sprycel.  It would be nice if we could.  I will plan to see her back in 6 months.    Volanda Napoleon, MD 10/12/20231:25 PM

## 2022-05-27 ENCOUNTER — Telehealth: Payer: Self-pay | Admitting: *Deleted

## 2022-05-27 NOTE — Telephone Encounter (Signed)
Unable to reach pt, lmovm with iron results.

## 2022-05-27 NOTE — Telephone Encounter (Signed)
-----   Message from Volanda Napoleon, MD sent at 05/26/2022  4:46 PM EDT ----- Call and let her know that the iron level is okay.  Call and let her know that the iron level is okay.

## 2022-06-06 LAB — BCR/ABL

## 2022-06-08 ENCOUNTER — Other Ambulatory Visit (HOSPITAL_COMMUNITY): Payer: Self-pay

## 2022-06-16 ENCOUNTER — Other Ambulatory Visit (HOSPITAL_COMMUNITY): Payer: Self-pay

## 2022-06-17 ENCOUNTER — Ambulatory Visit: Payer: BC Managed Care – PPO | Admitting: Internal Medicine

## 2022-06-17 ENCOUNTER — Encounter: Payer: Self-pay | Admitting: Internal Medicine

## 2022-06-17 VITALS — BP 122/66 | HR 63 | Temp 98.0°F | Resp 16 | Ht 67.0 in | Wt 206.2 lb

## 2022-06-17 DIAGNOSIS — R21 Rash and other nonspecific skin eruption: Secondary | ICD-10-CM

## 2022-06-17 DIAGNOSIS — M79646 Pain in unspecified finger(s): Secondary | ICD-10-CM

## 2022-06-17 NOTE — Progress Notes (Unsigned)
   Subjective:    Patient ID: [name], female    DOB: July 25, 1971, 51 y.o.   MRN: [id number]  DOS:  06/17/2022 Type of visit - description: f/u  Was recently seen with a rash felt to be fleabites. She is concerned because the area is not healing as fast as she was expecting. No fever or chills. No new lesions. She has a tendency to "pick on the scabs" which create more irritation and mild bleeding  Also having some pain at the base of the left thumb. Also occasional shooting pain at the dorsum of the R foot depending on certain positions.  Review of Systems See above   Past Medical History:  Diagnosis Date   Anxiety and depression 04/02/2013   CML (chronic myelocytic leukemia) ([place]) 2013   Glaucoma suspect    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)    Past Surgical History:  Procedure Laterality Date   CESAREAN SECTION  2006 & 2012   TUBAL LIGATION      Current Outpatient Medications  Medication Instructions   augmented betamethasone dipropionate (DIPROLENE-AF) 0.05 % cream Topical, 2 times daily   calcium carbonate (OS-CAL - DOSED IN MG OF ELEMENTAL CALCIUM) 1250 (500 Ca) MG tablet 1 tablet, Oral   cholecalciferol (VITAMIN D) 1,000 Units, Oral, Daily   dasatinib (SPRYCEL) 50 MG tablet TAKE 1 TABLET (50 MG TOTAL) BY MOUTH DAILY.   Ferrous Gluconate-C-Folic Acid (IRON-C PO) Oral   fexofenadine (ALLEGRA) 180 mg, Oral, Daily   FLUoxetine (PROZAC) 10 MG capsule TAKE 2 CAPSULES(20 MG) BY MOUTH DAILY   melatonin 3 mg, Oral, Daily PRN   Vitamins/Minerals TABS Oral, Daily       Objective:   Physical Exam BP 122/66   Pulse 63   Temp 98 F (36.7 C) (Oral)   Resp 16   Ht '5\' 7"'$  (1.702 m)   Wt 206 lb 4 oz (93.6 kg)   SpO2 96%   BMI 32.30 kg/m  General:   Well developed, NAD, BMI noted. HEENT:  Normocephalic . Face symmetric, atraumatic MSK: Normal hands and wrists. Skin: Compared to the last visit, lower extremity skin looks better.  Many of the  lesions are now healed, still hyper pigmented, very few have scabs on.  No edema, no cellulitis diaper changes. Neurologic:  alert & oriented X3.  Speech normal, gait appropriate for age and unassisted Psych--  Cognition and judgment appear intact.  Cooperative with normal attention span and concentration.  Behavior appropriate. No anxious or depressed appearing.      Assessment     Assessment  Anxiety depression --- dx 2014 ADD dx 2015 , rx  meds 2015 , self d/c ~ 04-2015  CML ---  DX 2013 + PPD, s/p treatment, had a BCG Glaucoma suspect BTL Menopausal age 51 Chronic dermatitis (perianal, GU area) has tried different medications, currently on hydrocortisone as needed Osteopenia T score -1.1 (11-2020)  PLAN Fleabites? Patient presented 05/25/2022 with a rash, Dx was fleabites.  Objectively that seems to be improving. Recommend to avoid removing the scabs, she is using multiple OTC creams recommend simply Alvino and OTC hydrocortisone 1%. If no gradual improvement, recommend to see dermatology. Pain, left thumb: Exam is essentially benign, early DJD?  Recommend observation for now.

## 2022-06-18 NOTE — Assessment & Plan Note (Signed)
Fleabites Patient presented 05/25/2022 with a rash, Dx was fleabites.  Objectively that seems to be improving. Recommend to avoid removing the scabs, she is using multiple OTC creams recommend simply Aveeno and OTC hydrocortisone 1%. If no gradual improvement, recommend to see dermatology. Pain, left thumb: Exam is essentially benign, early DJD?  Recommend observation for now.

## 2022-07-04 ENCOUNTER — Other Ambulatory Visit (HOSPITAL_COMMUNITY): Payer: Self-pay

## 2022-07-06 ENCOUNTER — Other Ambulatory Visit (HOSPITAL_COMMUNITY): Payer: Self-pay

## 2022-07-13 ENCOUNTER — Telehealth: Payer: Self-pay

## 2022-07-13 ENCOUNTER — Other Ambulatory Visit (HOSPITAL_COMMUNITY): Payer: Self-pay

## 2022-07-13 NOTE — Telephone Encounter (Signed)
Oral Oncology Patient Advocate Encounter  Prior Authorization for Sprycel has been approved.    PA# [id number]  Effective dates: 07/13/22 through 07/14/23  Patients co-pay is $0 with co-pay card secondary.    [name], [facility] Patient [name]  [phone] (phone) [phone] (fax) 07/13/2022 9:00 AM

## 2022-07-13 NOTE — Telephone Encounter (Signed)
Oral Oncology Patient Advocate Encounter   Received notification that prior authorization for Sprycel is due for renewal.   PA submitted on 07/13/22  Key [id number]  Status is pending     [name], [place] Patient [place]  [phone] (phone) [phone] (fax) 07/13/2022 8:13 AM

## 2022-07-26 ENCOUNTER — Other Ambulatory Visit: Payer: Self-pay | Admitting: Family

## 2022-07-26 ENCOUNTER — Encounter: Payer: Self-pay | Admitting: Family

## 2022-07-26 ENCOUNTER — Ambulatory Visit: Payer: BC Managed Care – PPO | Admitting: Family

## 2022-07-26 VITALS — BP 116/72 | HR 75 | Temp 98.0°F | Resp 18 | Ht 67.0 in | Wt 210.6 lb

## 2022-07-26 DIAGNOSIS — J069 Acute upper respiratory infection, unspecified: Secondary | ICD-10-CM

## 2022-07-26 DIAGNOSIS — J029 Acute pharyngitis, unspecified: Secondary | ICD-10-CM | POA: Diagnosis not present

## 2022-07-26 LAB — POC COVID19 BINAXNOW: SARS Coronavirus 2 Ag: NEGATIVE

## 2022-07-26 LAB — POCT INFLUENZA A/B
Influenza A, POC: NEGATIVE
Influenza B, POC: NEGATIVE

## 2022-07-26 LAB — POCT RAPID STREP A (OFFICE): Rapid Strep A Screen: NEGATIVE

## 2022-07-26 MED ORDER — FLUTICASONE PROPIONATE 50 MCG/ACT NA SUSP: 2.0000 | Freq: Every day | NASAL | 6 refills | Status: AC

## 2022-07-26 MED ORDER — BENZONATATE 100 MG PO CAPS: 100.0000 mg | ORAL_CAPSULE | Freq: Three times a day (TID) | ORAL | 0 refills | Status: DC | PRN

## 2022-07-26 NOTE — Progress Notes (Signed)
[name] is a 51 y.o. 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 60 g 1   benzonatate (TESSALON) 100 MG capsule Take 1 capsule (100 mg total) by mouth 3 (three) times daily as needed. 20 capsule 0   calcium carbonate (OS-CAL - DOSED IN MG OF ELEMENTAL CALCIUM) 1250 (500 Ca) MG tablet Take 1 tablet by mouth.     dasatinib (SPRYCEL) 50 MG tablet TAKE 1 TABLET (50 MG TOTAL) BY MOUTH DAILY. 30 tablet 12   Ferrous Gluconate-C-Folic Acid (IRON-C PO) Take by mouth.     fexofenadine (ALLEGRA) 180 MG tablet Take 1 tablet (180 mg total) by mouth daily. 30 tablet 1   FLUoxetine (PROZAC) 10 MG capsule TAKE 2 CAPSULES(20 MG) BY MOUTH DAILY 180 capsule 1   fluticasone (FLONASE) 50 MCG/ACT nasal spray Place 2 sprays into both nostrils daily. 16 g 6   melatonin 3 MG TABS tablet Take 3 mg by mouth daily as needed.     Vitamins/Minerals TABS Take by mouth daily.      No current facility-administered medications for this visit.    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)    Past Surgical History:  Procedure Laterality Date   CESAREAN SECTION  2006 & 2012   TUBAL LIGATION      Family History  Problem Relation Age of Onset   Hypertension Maternal Grandmother    Cancer Maternal Grandfather    Heart disease  Paternal Grandmother    Stroke Paternal Grandfather    Diabetes Neg Hx    Colon cancer Neg Hx    Breast cancer Neg Hx     Social History   Tobacco Use   Smoking status: Never   Smokeless tobacco: Never   Tobacco comments:    never used tobacco  Substance Use Topics   Alcohol use: No    Alcohol/week: 0.0 standard drinks of alcohol    Subjective:   2-3 day history of sore throat/ cough/ congestion; started on Sunday with sore throat; has been having chills/ body aches; son had similar symptoms last week- his symptoms lasted for about 3 days; History of CML- in remission x 3 years;  No OTC medications;   Objective:  Vitals:   07/26/22 0933  BP: 116/72  Pulse: 75  Resp: 18  Temp: 98 F (36.7 C)  TempSrc: Oral  SpO2: 98%  Weight: 210 lb 9.6 oz (95.5 kg)  Height: '5\' 7"'$  (1.702 m)    General: Well developed, well nourished, in no acute distress  Skin : Warm and dry.  Head: Normocephalic and atraumatic  Eyes: Sclera and conjunctiva clear; pupils round and reactive to light; extraocular movements intact  Ears: External normal; canals clear; tympanic membranes normal  Oropharynx: Pink, supple. No suspicious lesions  Neck: Supple without thyromegaly, adenopathy  Lungs:  Respirations unlabored; clear to auscultation bilaterally without wheeze, rales, rhonchi  CVS exam: normal rate and regular rhythm.  Neurologic: Alert and oriented; speech intact; face symmetrical; moves all extremities well; CNII-XII intact without focal deficit  Assessment:  1. Sore throat   2. Viral URI with cough     Plan:  Rapid strep, flu and COVID all negative; suspect viral illness; symptomatic treatment discussed; increased fluids, rest; Rx for Allegra, Flonase and Tessalon Perles; if no improvement by the end of the week, to consider starting antibiotic.   No follow-ups on file.  Orders Placed This Encounter  Procedures   POCT Influenza A/B   POCT rapid strep A   POC COVID-19    Order Specific  Question:   Previously tested for COVID-19    Answer:   No    Order Specific Question:   Resident in a congregate (group) care setting    Answer:   No    Order Specific Question:   Employed in healthcare setting    Answer:   No    Order Specific Question:   Pregnant    Answer:   No    Requested Prescriptions   Signed Prescriptions Disp Refills   fluticasone (FLONASE) 50 MCG/ACT nasal spray 16 g 6    Sig: Place 2 sprays into both nostrils daily.   benzonatate (TESSALON) 100 MG capsule 20 capsule 0    Sig: Take 1 capsule (100 mg total) by mouth 3 (three) times daily as needed.

## 2022-07-29 ENCOUNTER — Other Ambulatory Visit: Payer: Self-pay

## 2022-08-03 ENCOUNTER — Other Ambulatory Visit: Payer: Self-pay

## 2022-08-05 ENCOUNTER — Other Ambulatory Visit: Payer: Self-pay

## 2022-08-05 ENCOUNTER — Other Ambulatory Visit: Payer: Self-pay | Admitting: Internal Medicine

## 2022-08-05 ENCOUNTER — Other Ambulatory Visit (HOSPITAL_COMMUNITY): Payer: Self-pay

## 2022-08-12 ENCOUNTER — Other Ambulatory Visit (HOSPITAL_COMMUNITY): Payer: Self-pay

## 2022-08-17 ENCOUNTER — Other Ambulatory Visit: Payer: Self-pay

## 2022-09-09 ENCOUNTER — Other Ambulatory Visit (HOSPITAL_COMMUNITY): Payer: Self-pay

## 2022-09-12 ENCOUNTER — Other Ambulatory Visit (HOSPITAL_COMMUNITY): Payer: Self-pay

## 2022-09-13 ENCOUNTER — Other Ambulatory Visit (HOSPITAL_COMMUNITY): Payer: Self-pay

## 2022-09-21 ENCOUNTER — Other Ambulatory Visit: Payer: Self-pay

## 2022-09-30 ENCOUNTER — Encounter: Payer: Self-pay | Admitting: Internal Medicine

## 2022-10-03 ENCOUNTER — Encounter: Payer: BC Managed Care – PPO | Admitting: Internal Medicine

## 2022-10-11 ENCOUNTER — Other Ambulatory Visit (HOSPITAL_COMMUNITY): Payer: Self-pay

## 2022-10-14 ENCOUNTER — Other Ambulatory Visit (HOSPITAL_COMMUNITY): Payer: Self-pay

## 2022-10-14 ENCOUNTER — Other Ambulatory Visit: Payer: Self-pay

## 2022-10-14 ENCOUNTER — Other Ambulatory Visit: Payer: Self-pay | Admitting: Hematology & Oncology

## 2022-10-14 MED ORDER — DASATINIB 50 MG PO TABS
ORAL_TABLET | Freq: Every day | ORAL | 12 refills | Status: AC
  Filled 2022-10-14: qty 30, 30d supply, fill #0
  Filled 2022-11-15: qty 30, 30d supply, fill #1
  Filled 2022-12-21: qty 30, 30d supply, fill #2
  Filled 2023-02-17: qty 30, 30d supply, fill #3
  Filled 2023-03-30: qty 30, 30d supply, fill #4
  Filled 2023-05-02: qty 30, 30d supply, fill #5

## 2022-10-19 ENCOUNTER — Other Ambulatory Visit (HOSPITAL_COMMUNITY): Payer: Self-pay

## 2022-10-26 ENCOUNTER — Telehealth: Payer: Self-pay

## 2022-10-26 NOTE — Transitions of Care (Post Inpatient/ED Visit) (Signed)
   10/26/2022  Name: NIMRA PUCCINELLI MRN: [id number] DOB: Mar 29, 1971  Today's TOC FU Call Status: Today's TOC FU Call Status:: Successful TOC FU Call Competed TOC FU Call Complete Date: 10/26/22  Transition Care Management Follow-up Telephone Call Date of Discharge: 10/25/22 Discharge Facility: Other ([place]) Name of Other ([place]) Discharge Facility: [facility] Type of Discharge: Inpatient Admission Primary Inpatient Discharge Diagnosis:: fracture fibula How have you been since you were released from the hospital?: Better Any questions or concerns?: No  Items Reviewed: Did you receive and understand the discharge instructions provided?: Yes Medications obtained and verified?: Yes (Medications Reviewed) Any new allergies since your discharge?: No Dietary orders reviewed?: Yes Do you have support at home?: Yes People in Home: child(ren), adult  Home Care and Equipment/Supplies: [place] Ordered?: NA Any new equipment or medical supplies ordered?: NA  Functional Questionnaire: Do you need assistance with bathing/showering or dressing?: Yes Do you need assistance with meal preparation?: No Do you need assistance with eating?: No Do you have difficulty maintaining continence: No Do you need assistance with getting out of bed/getting out of a chair/moving?: Yes Do you have difficulty managing or taking your medications?: No  Folllow up appointments reviewed: [facility] Follow-up appointment confirmed?: Yes Date of Specialist follow-up appointment?: 10/26/22 Follow-Up Specialty Provider:: Ortho Do you need transportation to your follow-up appointment?: No Do you understand care options if your condition(s) worsen?: Yes-patient verbalized understanding Has follow up appt with Dr Larose Kells 11/04/2022 @ 4:00   [place], [place] Nurse Health Advisor Direct Dial [phone]

## 2022-10-26 NOTE — Telephone Encounter (Signed)
noted 

## 2022-11-04 ENCOUNTER — Inpatient Hospital Stay: Payer: BC Managed Care – PPO | Admitting: Internal Medicine

## 2022-11-04 ENCOUNTER — Encounter: Payer: Self-pay | Admitting: Internal Medicine

## 2022-11-04 ENCOUNTER — Ambulatory Visit: Payer: BC Managed Care – PPO | Admitting: Internal Medicine

## 2022-11-04 VITALS — BP 116/62 | HR 62 | Temp 97.7°F | Resp 18 | Ht 67.0 in | Wt 212.4 lb

## 2022-11-04 DIAGNOSIS — D649 Anemia, unspecified: Secondary | ICD-10-CM

## 2022-11-04 DIAGNOSIS — R55 Syncope and collapse: Secondary | ICD-10-CM

## 2022-11-04 DIAGNOSIS — S82831D Other fracture of upper and lower end of right fibula, subsequent encounter for closed fracture with routine healing: Secondary | ICD-10-CM | POA: Insufficient documentation

## 2022-11-04 NOTE — Progress Notes (Unsigned)
Subjective:    Patient ID: [name], female    DOB: 02-01-1971, 52 y.o.   MRN: [id number]  DOS:  11/04/2022 Type of visit - description: acute  Went to the ER 10/24/2022: She was at home, suddenly developed mid anterior chest pain described as a cramp, the next thing she knew she was on the floor. At the time did not sustain head or neck injury but she developed right fibula fracture. No bladder or bowel incontinence. no sz activity, no post tictal. She had some   shortness of breath and dizziness.  Felt slightly clammy. Denies diplopia, slurred speech or facial numbness. After the syncope went to the ER.  Workup:  EKG: Sinus bradycardia, poor R wave progression. CTA: No pulmonary emboli, no acute findings. X-ray: R right fibula closed fracture. Magnesium slightly low at 1.8.  BMP okay, hemoglobin 11.1 slightly low.Ferritin normal, iron slightly low. UA no WBCs. Troponin negative. TSH normal.   Review of Systems Since the ER visit had no further syncope however for the last month has been experiencing episodes of ill-defined weakness associated with some nausea and mild dizziness. She thinks is "a low blood pressure feeling" although she has never checked her blood pressure.   Past Medical History:  Diagnosis Date   Anxiety and depression 04/02/2013   CML (chronic myelocytic leukemia) ([place]) 2013   Glaucoma suspect    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)    Past Surgical History:  Procedure Laterality Date   CESAREAN SECTION  2006 & 2012   TUBAL LIGATION      Current Outpatient Medications  Medication Instructions   calcium carbonate (OS-CAL - DOSED IN MG OF ELEMENTAL CALCIUM) 1250 (500 Ca) MG tablet 1 tablet, Oral   dasatinib (SPRYCEL) 50 MG tablet TAKE 1 TABLET (50 MG TOTAL) BY MOUTH DAILY.   Ferrous Gluconate-C-Folic Acid (IRON-C PO) Oral   FLUoxetine (PROZAC) 20 mg, Oral, Daily   fluticasone (FLONASE) 50 MCG/ACT nasal spray 2 sprays, Each  Nare, Daily   melatonin 3 mg, Oral, Daily PRN   meloxicam (MOBIC) 15 MG tablet 1 tablet, Oral, Daily   Vitamins/Minerals TABS Oral, Daily       Objective:   Physical Exam BP 116/62   Pulse 62   Temp 97.7 F (36.5 C) (Oral)   Resp 18   Ht 5\' 7"  (1.702 m)   Wt 212 lb 6 oz (96.3 kg)   SpO2 97%   BMI 33.26 kg/m  General:   Well developed, NAD, BMI noted. HEENT:  Normocephalic . Face symmetric, atraumatic Neck: Normal carotid pulses Lungs:  CTA B Normal respiratory effort, no intercostal retractions, no accessory muscle use. Heart: RRR,  no murmur.  Skin: Not pale. Not jaundice Neurologic:  alert & oriented X3.  Speech normal, gait limited by a right boot. Psych--  Cognition and judgment appear intact.  Cooperative with normal attention span and concentration.  Behavior appropriate. No anxious or depressed appearing.      Assessment     Assessment  Anxiety depression --- dx 2014 ADD dx 2015 , rx  meds 2015 , self d/c ~ 04-2015  CML ---  DX 2013 + PPD, s/p treatment, had a BCG Glaucoma suspect BTL Menopausal age 1 Chronic dermatitis (perianal, GU area) has tried different medications, currently on hydrocortisone as needed Osteopenia T score -1.1 (11-2020)  PLAN Syncope: 52 year old with syncope x1. She also reports that in the last month has episodes of  ill-defined weakness associated with some dizziness.  The last time he had such episode she laid down in the floor, elevate her legs and felt somewhat better.  No documented low BPs. Labs at the ER essentially negative, CT chest no PE. EKG today: Sinus rhythm, rate 62. Plan: Recheck CBC and magnesium. Refer to cardiology for further eval.  She ask about driving, I recommend not to drive for now. Right fibula fracture: Per sports medicine. RTC 3 months

## 2022-11-04 NOTE — Patient Instructions (Addendum)
Check the  blood pressure regularly ?BP GOAL is between 110/65 and  135/85. ?If it is consistently higher or lower, let me know ? ?  ?GO TO THE LAB : Get the blood work   ? ? ?GO TO THE FRONT DESK, PLEASE SCHEDULE YOUR APPOINTMENTS ?Come back for a checkup in 3 months ?

## 2022-11-05 NOTE — Assessment & Plan Note (Signed)
Syncope: 52 year old with syncope x1. She also reports that in the last month has episodes of ill-defined weakness associated with some dizziness.  The last time he had such episode she laid down in the floor, elevate her legs and felt somewhat better.  No documented low BPs. Labs at the ER essentially negative, CT chest no PE. EKG today: Sinus rhythm, rate 62. Plan: Recheck CBC and magnesium. Refer to cardiology for further eval.  She ask about driving, I recommend not to drive for now. Right fibula fracture: Per sports medicine. RTC 3 months

## 2022-11-09 ENCOUNTER — Ambulatory Visit (INDEPENDENT_AMBULATORY_CARE_PROVIDER_SITE_OTHER): Payer: BC Managed Care – PPO

## 2022-11-09 ENCOUNTER — Ambulatory Visit: Payer: BC Managed Care – PPO | Attending: Cardiology | Admitting: Internal Medicine

## 2022-11-09 ENCOUNTER — Encounter: Payer: Self-pay | Admitting: Internal Medicine

## 2022-11-09 VITALS — BP 120/60 | HR 69 | Ht 67.0 in | Wt 215.0 lb

## 2022-11-09 DIAGNOSIS — N182 Chronic kidney disease, stage 2 (mild): Secondary | ICD-10-CM

## 2022-11-09 DIAGNOSIS — R55 Syncope and collapse: Secondary | ICD-10-CM

## 2022-11-09 DIAGNOSIS — Z6833 Body mass index (BMI) 33.0-33.9, adult: Secondary | ICD-10-CM

## 2022-11-09 DIAGNOSIS — R0600 Dyspnea, unspecified: Secondary | ICD-10-CM

## 2022-11-09 DIAGNOSIS — C9210 Chronic myeloid leukemia, BCR/ABL-positive, not having achieved remission: Secondary | ICD-10-CM

## 2022-11-09 MED ORDER — METOPROLOL TARTRATE 50 MG PO TABS: 50.0000 mg | ORAL_TABLET | Freq: Once | ORAL | 0 refills | Status: DC

## 2022-11-09 NOTE — Progress Notes (Addendum)
Cardiology Office Note:    Date:  11/09/2022   ID:  [name], DOB April 16, 1971, MRN [id number]  PCP:  Lauren Plump, MD   [facility] Providers Cardiologist:  Alverda Skeans, MD Referring MD: Lauren Plump, MD   Chief Complaint/Reason for Referral:  Syncope  ASSESSMENT:    1. Syncope and collapse   2. CKD (chronic kidney disease) stage 2, GFR 60-89 ml/min   3. CML (chronic myelocytic leukemia) (H[facility])   4. BMI 33.0-33.9,adult   5. Dyspnea, unspecified type     PLAN:    In order of problems listed above: 1.  Syncope: Will obtain monitor, echocardiogram, and CTA.  If reassuring her episode likely represents a vasovagal mechanism.   2.  CKD stage II:  Monitor for now. 3.  CML: Continue current therapy; followed by oncology and on chronic immunotherapy. 4.  Elevated BMI: I encouraged her to pursue diet and exercise as she did in the past when she is recuperated from her fibular fracture. 5.  Dyspnea: Will obtain coronary CTA to evaluate further.  If the patient has mild obstructive coronary artery disease, they will require a statin (with goal LDL < 70) and aspirin, if they have high-grade disease we will need to consider optimal medical therapy and if symptoms are refractory to medical therapy, then a cardiac catheterization with possible PCI will be pursued to alleviate symptoms.  If they have high risk disease we will proceed directly to cardiac catheterization.     ADDENDUM: Patient is on maintenance Sprycel for her CML.  I was alerted by pharmacy that there is a significant drug drug interaction with this medication and aspirin leading to severe bleeding.  For this reason we will defer aspirin in this patient.              Dispo:  Return if symptoms worsen or fail to improve.      Medication Adjustments/Labs and Tests Ordered: Current medicines are reviewed at length with the patient today.  Concerns regarding medicines are outlined above.  The following changes  have been made:  no change   Labs/tests ordered: Orders Placed This Encounter  Procedures   CT CORONARY MORPH W/CTA COR W/SCORE W/CA W/CM &/OR WO/CM   LONG TERM MONITOR (3-14 DAYS)   EKG 12-Lead   ECHOCARDIOGRAM COMPLETE    Medication Changes: Meds ordered this encounter  Medications   metoprolol tartrate (LOPRESSOR) 50 MG tablet    Sig: Take 1 tablet (50 mg total) by mouth once for 1 dose. Take 90-120 minutes prior to scan.    Dispense:  1 tablet    Refill:  0     Current medicines are reviewed at length with the patient today.  The patient does not have concerns regarding medicines.   History of Present Illness:    FOCUSED PROBLEM LIST:   CML in molecular remission on immunotherapy CKD stage II BMI 33  The patient is a 52 y.o. female with the indicated medical history here for recommendations regarding syncope.  Patient presented to an outside hospital on March 11 of this year.  She had an episode of chest pain and shortness of breath followed by syncope.  Currently she fell down and sustained a fibular fracture.  EKG demonstrated sinus rhythm with poor R wave progression.  CT scan demonstrated no aortic pathology, coronary artery calcification, or pulmonary embolism.  Troponins were negative x 2 other lab work being unremarkable.  Seen by her primary care provider recently.  She has had no recurrent episodes of syncope.  Her blood pressure at that visit was well-controlled.  The patient tells me that on the day of the event she was very thirsty and was drinking a lot of water more than she usually does and at a more rapid pace.  Following that she swallowed very hard.  She then became nauseated and lightheaded.  Before this happened she denied any exertional angina.  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  She has gained approximately 40 pounds in the last 3 to 4 years and she does admit to some shortness of breath when she is walking.  She denies  any presyncope, syncope, palpitations, paroxysmal nocturnal dyspnea, orthopnea, signs or symptoms of stroke, or bad bleeding prior to the episode.  Since the episode she has had no recurrent issues.  She is currently in a splint due to her right fibular fracture.          Current Medications: Current Meds  Medication Sig   calcium carbonate (OS-CAL - DOSED IN MG OF ELEMENTAL CALCIUM) 1250 (500 Ca) MG tablet Take 1 tablet by mouth.   dasatinib (SPRYCEL) 50 MG tablet TAKE 1 TABLET (50 MG TOTAL) BY MOUTH DAILY.   Ferrous Gluconate-C-Folic Acid (IRON-C PO) Take by mouth.   FLUoxetine (PROZAC) 10 MG capsule Take 2 capsules (20 mg total) by mouth daily.   fluticasone (FLONASE) 50 MCG/ACT nasal spray Place 2 sprays into both nostrils daily.   melatonin 3 MG TABS tablet Take 3 mg by mouth daily as needed.   meloxicam (MOBIC) 15 MG tablet Take 1 tablet by mouth daily.   metoprolol tartrate (LOPRESSOR) 50 MG tablet Take 1 tablet (50 mg total) by mouth once for 1 dose. Take 90-120 minutes prior to scan.   Vitamins/Minerals TABS Take by mouth daily.      Allergies:    Chocolate flavor   Social History:   Social History   Tobacco Use   Smoking status: Never   Smokeless tobacco: Never   Tobacco comments:    never used tobacco  Vaping Use   Vaping Use: Never used  Substance Use Topics   Alcohol use: No    Alcohol/week: 0.0 standard drinks of alcohol   Drug use: No     Family Hx: Family History  Problem Relation Age of Onset   Hypertension Maternal Grandmother    Cancer Maternal Grandfather    Heart disease Paternal Grandmother    Stroke Paternal Grandfather    Diabetes Neg Hx    Colon cancer Neg Hx    Breast cancer Neg Hx      Review of Systems:   Please see the history of present illness.    All other systems reviewed and are negative.     EKGs/Labs/Other Test Reviewed:    EKG:  EKG performed today that I personally reviewed demonstrates normal sinus rhythm with  nonspecific T wave abnormality.  Prior CV studies:  Screening abdominal ultrasound 2015: Negative    Other studies Reviewed: Review of the additional studies/records demonstrates: Imaging studies reviewed demonstrate no evidence of aortic atherosclerosis or coronary artery calcification  Recent Labs: 05/26/2022: ALT 16; BUN 20; Creatinine 0.84; Hemoglobin 11.5; Platelet Count 184; Potassium 4.4; Sodium 140   Recent Lipid Panel Lab Results  Component Value Date/Time   CHOL 235 (H) 10/01/2021 08:47 AM   TRIG 64.0 10/01/2021 08:47 AM   HDL 87.10 10/01/2021 08:47 AM   LDLCALC 135 (  H) 10/01/2021 08:47 AM    Risk Assessment/Calculations:                Physical Exam:    VS:  BP 120/60   Pulse 69   Ht 5\' 7"  (1.702 m)   Wt 215 lb (97.5 kg)   SpO2 99%   BMI 33.67 kg/m    Wt Readings from Last 3 Encounters:  11/09/22 215 lb (97.5 kg)  11/04/22 212 lb 6 oz (96.3 kg)  07/26/22 210 lb 9.6 oz (95.5 kg)    GENERAL:  No apparent distress, AOx3 HEENT:  No carotid bruits, +2 carotid impulses, no scleral icterus CAR: RRR no murmurs, gallops, rubs, or thrills RES:  Clear to auscultation bilaterally ABD:  Soft, nontender, nondistended, positive bowel sounds x 4 VASC:  +2 radial pulses, +2 carotid pulses, palpable pedal pulses NEURO:  CN 2-12 grossly intact; motor and sensory grossly intact PSYCH:  No active depression or anxiety EXT:  No edema, ecchymosis, or cyanosis  Signed, Orbie Pyo, MD  11/09/2022 9:30 AM    [facility] [address] [place], [place], [place]  [address] Phone: [phone]; Fax: [phone]   Note:  This document was prepared using Dragon voice recognition software and may include unintentional dictation errors.

## 2022-11-09 NOTE — Patient Instructions (Addendum)
Medication Instructions:  No changes *If you need a refill on your cardiac medications before your next appointment, please call your pharmacy*   Lab Work: Today: bme If you have labs (blood work) drawn today and your tests are completely normal, you will receive your results only by: [place] (if you have MyChart) OR A paper copy in the mail If you have any lab test that is abnormal or we need to change your treatment, we will call you to review the results.   Testing/Procedures: Your physician has requested that you have an echocardiogram. Echocardiography is a painless test that uses sound waves to create images of your heart. It provides your doctor with information about the size and shape of your heart and how well your heart's chambers and valves are working. This procedure takes approximately one hour. There are no restrictions for this procedure. Please do NOT wear cologne, perfume, aftershave, or lotions (deodorant is allowed). Please arrive 15 minutes prior to your appointment time.  Zio XT - to be applied today   Follow-Up: As needed  Other Instructions   Your cardiac CT will be scheduled at  [facility] [place], [place] [address] [phone]   Please arrive at the [facility] En[facility] (Entrance C2) of [facility] 30 minutes prior to test start time. You can use the FREE valet parking offered at entrance C (encouraged to control the heart rate for the test)  Proceed to the [facility] Radiology Department (first floor) to check-in and test prep.  All radiology patients and guests should use entrance C2 at [facility], accessed from [facility], even though the hospital's physical address listed is [address].     Please follow these instructions carefully (unless otherwise directed):   On the Night Before the Test: Be sure to Drink plenty of water. Do not consume any  caffeinated/decaffeinated beverages or chocolate 12 hours prior to your test. Do not take any antihistamines 12 hours prior to your test.  On the Day of the Test: Drink plenty of water until 1 hour prior to the test. Do not eat any food 1 hour prior to test. You may take your regular medications prior to the test.  Take metoprolol (Lopressor) two hours prior to test. FEMALES- please wear underwire-free bra if available, avoid dresses & tight clothing       After the Test: Drink plenty of water. After receiving IV contrast, you may experience a mild flushed feeling. This is normal. On occasion, you may experience a mild rash up to 24 hours after the test. This is not dangerous. If this occurs, you can take Benadryl 25 mg and increase your fluid intake. If you experience trouble breathing, this can be serious. If it is severe call 911 IMMEDIATELY. If it is mild, please call our office. If you take any of these medications: Glipizide/Metformin, Avandament, Glucavance, please do not take 48 hours after completing test unless otherwise instructed.  We will call to schedule your test 2-4 weeks out understanding that some insurance companies will need an authorization prior to the service being performed.   For non-scheduling related questions, please contact the cardiac imaging nurse navigator should you have any questions/concerns: Marchia Bond, Cardiac Imaging Nurse Navigator Gordy Clement, Cardiac Imaging Nurse Navigator [facility] Direct Office Dial: [phone]   For scheduling needs, including cancellations and rescheduling, please call [place], [phone].

## 2022-11-09 NOTE — Progress Notes (Unsigned)
ZIO XT [id number] from office inventory applied to patient.

## 2022-11-10 ENCOUNTER — Other Ambulatory Visit: Payer: Self-pay | Admitting: Internal Medicine

## 2022-11-10 DIAGNOSIS — R55 Syncope and collapse: Secondary | ICD-10-CM

## 2022-11-10 DIAGNOSIS — R0600 Dyspnea, unspecified: Secondary | ICD-10-CM

## 2022-11-11 ENCOUNTER — Other Ambulatory Visit: Payer: Self-pay

## 2022-11-14 ENCOUNTER — Other Ambulatory Visit: Payer: Self-pay

## 2022-11-15 ENCOUNTER — Other Ambulatory Visit (HOSPITAL_COMMUNITY): Payer: Self-pay

## 2022-11-16 ENCOUNTER — Other Ambulatory Visit: Payer: Self-pay

## 2022-11-21 ENCOUNTER — Other Ambulatory Visit: Payer: Self-pay

## 2022-11-25 ENCOUNTER — Other Ambulatory Visit: Payer: Self-pay

## 2022-11-25 ENCOUNTER — Inpatient Hospital Stay: Payer: BC Managed Care – PPO | Attending: Hematology & Oncology | Admitting: Family

## 2022-11-25 ENCOUNTER — Inpatient Hospital Stay: Payer: BC Managed Care – PPO

## 2022-11-25 ENCOUNTER — Encounter: Payer: Self-pay | Admitting: Family

## 2022-11-25 VITALS — BP 126/68 | HR 62 | Temp 97.9°F | Resp 18 | Ht 67.0 in | Wt 217.8 lb

## 2022-11-25 DIAGNOSIS — Z79899 Other long term (current) drug therapy: Secondary | ICD-10-CM | POA: Diagnosis not present

## 2022-11-25 DIAGNOSIS — C9212 Chronic myeloid leukemia, BCR/ABL-positive, in relapse: Secondary | ICD-10-CM | POA: Diagnosis present

## 2022-11-25 DIAGNOSIS — C9210 Chronic myeloid leukemia, BCR/ABL-positive, not having achieved remission: Secondary | ICD-10-CM | POA: Diagnosis not present

## 2022-11-25 NOTE — Progress Notes (Signed)
Hematology and Oncology Follow Up Visit  [name] [id number] 1970/10/01 51 y.o. 11/25/2022   Principle Diagnosis:  Chronic phase CML- major molecular remission   Current Therapy:        Sprycel 50 mg by mouth daily   Interim History:  [name] is here today for follow-up. She is doing well recuperating from closed fracture of the fibula. She is wearing an immobilizer boot right now and is hoping to get out of it soon. She had passed out at home.  She is wearing a heart monitor and will be having a heart CT to better evaluate for cause.  No issue with infection. No fever, chills, n/v, cough, rash, dizziness, SOB, chest pain, palpitations, abdominal pain or changes in bowel or bladder habits.  Tingling in her fingertips unchanged from baseline.  No new falls or syncope reported.  Appetite and hydration are good. Weight is stable at 217 lbs.   ECOG Performance Status: 1 - Symptomatic but completely ambulatory  Medications:  Allergies as of 11/25/2022       Reactions   Chocolate Flavor Hives        Medication List        Accurate as of November 25, 2022  3:41 PM. If you have any questions, ask your nurse or doctor.          calcium carbonate 1250 (500 Ca) MG tablet Commonly known as: OS-CAL - dosed in mg of elemental calcium Take 1 tablet by mouth.   FLUoxetine 10 MG capsule Commonly known as: PROZAC Take 2 capsules (20 mg total) by mouth daily.   fluticasone 50 MCG/ACT nasal spray Commonly known as: FLONASE Place 2 sprays into both nostrils daily.   IRON-C PO Take by mouth.   melatonin 3 MG Tabs tablet Take 3 mg by mouth daily as needed.   meloxicam 15 MG tablet Commonly known as: MOBIC Take 1 tablet by mouth daily.   metoprolol tartrate 50 MG tablet Commonly known as: LOPRESSOR Take 1 tablet (50 mg total) by mouth once for 1 dose. Take 90-120 minutes prior to scan.   Sprycel 50 MG tablet Generic drug: dasatinib Tome 1 tableta (50 mg en total) por  va oral diariamente. (TAKE 1 TABLET (50 MG TOTAL) BY MOUTH DAILY.)   Vitamins/Minerals Tabs Take by mouth daily.        Allergies:  Allergies  Allergen Reactions   Chocolate Flavor Hives    Past Medical History, Surgical history, Social history, and Family History were reviewed and updated.  Review of Systems: All other 10 point review of systems is negative.   Physical Exam:  height is 5\' 7"  (1.702 m) and weight is 217 lb 12.8 oz (98.8 kg). Her oral temperature is 97.9 F (36.6 C). Her blood pressure is 126/68 and her pulse is 62. Her respiration is 18 and oxygen saturation is 100%.   Wt Readings from Last 3 Encounters:  11/25/22 217 lb 12.8 oz (98.8 kg)  11/09/22 215 lb (97.5 kg)  11/04/22 212 lb 6 oz (96.3 kg)    Ocular: Sclerae unicteric, pupils equal, round and reactive to light Ear-nose-throat: Oropharynx clear, dentition fair Lymphatic: No cervical or supraclavicular adenopathy Lungs no rales or rhonchi, good excursion bilaterally Heart regular rate and rhythm, no murmur appreciated Abd soft, nontender, positive bowel sounds MSK no focal spinal tenderness, no joint edema Neuro: non-focal, well-oriented, appropriate affect Breasts: Deferred   Lab Results  Component Value Date   WBC 3.7 (L) 05/26/2022  HGB 11.5 (L) 05/26/2022   HCT 35.8 (L) 05/26/2022   MCV 92.7 05/26/2022   PLT 184 05/26/2022   Lab Results  Component Value Date   FERRITIN 170 05/26/2022   IRON 41 05/26/2022   TIBC 323 05/26/2022   UIBC 282 05/26/2022   IRONPCTSAT 13 05/26/2022   Lab Results  Component Value Date   RBC 3.86 (L) 05/26/2022   No results found for: "KPAFRELGTCHN", "LAMBDASER", "KAPLAMBRATIO" No results found for: "IGGSERUM", "IGA", "IGMSERUM" No results found for: "TOTALPROTELP", "ALBUMINELP", "A1GS", "A2GS", "BETS", "BETA2SER", "GAMS", "MSPIKE", "SPEI"   Chemistry      Component Value Date/Time   NA 140 05/26/2022 1304   NA 143 08/10/2017 1518   K 4.4  05/26/2022 1304   K 4.2 08/10/2017 1518   CL 105 05/26/2022 1304   CL 106 08/10/2017 1518   CO2 29 05/26/2022 1304   CO2 31 08/10/2017 1518   BUN 20 05/26/2022 1304   BUN 21 08/10/2017 1518   CREATININE 0.84 05/26/2022 1304   CREATININE 0.8 08/10/2017 1518      Component Value Date/Time   CALCIUM 9.5 05/26/2022 1304   CALCIUM 9.2 08/10/2017 1518   ALKPHOS 55 05/26/2022 1304   ALKPHOS 65 08/10/2017 1518   AST 20 05/26/2022 1304   ALT 16 05/26/2022 1304   ALT 24 08/10/2017 1518   BILITOT 0.3 05/26/2022 1304       Impression and Plan: [name] is a very pleasant 52 yo Hispanic female with chronic phase CML in MMR.  She relapsed in 2017 and is currently on Sprycel.  She is tolerating well and will continue her same regimen.  We will have her come back next week for lab only since our courier does not pick up BCR/ABL on Fridays.  Follow-up in 6 months.   Lauren Stanford, NP 4/12/20243:41 PM

## 2022-11-28 ENCOUNTER — Encounter: Payer: Self-pay | Admitting: Internal Medicine

## 2022-11-29 ENCOUNTER — Inpatient Hospital Stay: Payer: BC Managed Care – PPO

## 2022-12-05 ENCOUNTER — Ambulatory Visit (HOSPITAL_COMMUNITY): Payer: BC Managed Care – PPO | Attending: Internal Medicine

## 2022-12-05 DIAGNOSIS — C9210 Chronic myeloid leukemia, BCR/ABL-positive, not having achieved remission: Secondary | ICD-10-CM | POA: Diagnosis present

## 2022-12-05 DIAGNOSIS — R55 Syncope and collapse: Secondary | ICD-10-CM | POA: Diagnosis present

## 2022-12-05 DIAGNOSIS — N182 Chronic kidney disease, stage 2 (mild): Secondary | ICD-10-CM | POA: Insufficient documentation

## 2022-12-05 DIAGNOSIS — R0600 Dyspnea, unspecified: Secondary | ICD-10-CM | POA: Insufficient documentation

## 2022-12-05 DIAGNOSIS — Z6833 Body mass index (BMI) 33.0-33.9, adult: Secondary | ICD-10-CM | POA: Insufficient documentation

## 2022-12-05 LAB — ECHOCARDIOGRAM COMPLETE
AR max vel: 2.52 cm2
AV Area VTI: 2.38 cm2
AV Area mean vel: 2.48 cm2
AV Mean grad: 6 mmHg
AV Peak grad: 10.8 mmHg
Ao pk vel: 1.64 m/s
Area-P 1/2: 4.27 cm2
S' Lateral: 2.7 cm

## 2022-12-15 ENCOUNTER — Other Ambulatory Visit (HOSPITAL_COMMUNITY): Payer: Self-pay

## 2022-12-21 ENCOUNTER — Other Ambulatory Visit (HOSPITAL_COMMUNITY): Payer: Self-pay

## 2022-12-22 ENCOUNTER — Encounter (HOSPITAL_COMMUNITY): Payer: Self-pay

## 2022-12-26 ENCOUNTER — Ambulatory Visit: Payer: BC Managed Care – PPO | Admitting: Cardiology

## 2022-12-27 ENCOUNTER — Other Ambulatory Visit (HOSPITAL_COMMUNITY): Payer: Self-pay

## 2022-12-28 ENCOUNTER — Other Ambulatory Visit: Payer: Self-pay

## 2023-01-26 ENCOUNTER — Inpatient Hospital Stay: Payer: BC Managed Care – PPO | Attending: Hematology & Oncology

## 2023-01-26 DIAGNOSIS — C9210 Chronic myeloid leukemia, BCR/ABL-positive, not having achieved remission: Secondary | ICD-10-CM

## 2023-01-26 DIAGNOSIS — C9212 Chronic myeloid leukemia, BCR/ABL-positive, in relapse: Secondary | ICD-10-CM | POA: Insufficient documentation

## 2023-01-26 LAB — CMP (CANCER CENTER ONLY)
ALT: 27 U/L (ref 0–44)
AST: 20 U/L (ref 15–41)
Albumin: 4.5 g/dL (ref 3.5–5.0)
Alkaline Phosphatase: 59 U/L (ref 38–126)
Anion gap: 6 (ref 5–15)
BUN: 19 mg/dL (ref 6–20)
CO2: 29 mmol/L (ref 22–32)
Calcium: 9.3 mg/dL (ref 8.9–10.3)
Chloride: 106 mmol/L (ref 98–111)
Creatinine: 0.84 mg/dL (ref 0.44–1.00)
GFR, Estimated: >60 mL/min (ref >60)
Glucose, Bld: 99 mg/dL (ref 70–99)
Potassium: 4.1 mmol/L (ref 3.5–5.1)
Sodium: 141 mmol/L (ref 135–145)
Total Bilirubin: 0.5 mg/dL (ref 0.3–1.2)
Total Protein: 6.9 g/dL (ref 6.5–8.1)

## 2023-01-26 LAB — CBC WITH DIFFERENTIAL (CANCER CENTER ONLY)
Abs Immature Granulocytes: 0.01 10*3/uL (ref 0.00–0.07)
Basophils Absolute: 0 10*3/uL (ref 0.0–0.1)
Basophils Relative: 1 %
Eosinophils Absolute: 0.1 10*3/uL (ref 0.0–0.5)
Eosinophils Relative: 2 %
HCT: 34.6 % — ABNORMAL LOW (ref 36.0–46.0)
Hemoglobin: 11.2 g/dL — ABNORMAL LOW (ref 12.0–15.0)
Immature Granulocytes: 0 %
Lymphocytes Relative: 31 %
Lymphs Abs: 1.3 10*3/uL (ref 0.7–4.0)
MCH: 30 pg (ref 26.0–34.0)
MCHC: 32.4 g/dL (ref 30.0–36.0)
MCV: 92.8 fL (ref 80.0–100.0)
Monocytes Absolute: 0.3 10*3/uL (ref 0.1–1.0)
Monocytes Relative: 8 %
Neutro Abs: 2.3 10*3/uL (ref 1.7–7.7)
Neutrophils Relative %: 58 %
Platelet Count: 176 10*3/uL (ref 150–400)
RBC: 3.73 MIL/uL — ABNORMAL LOW (ref 3.87–5.11)
RDW: 13.7 % (ref 11.5–15.5)
WBC Count: 4.1 10*3/uL (ref 4.0–10.5)
nRBC: 0 % (ref 0.0–0.2)

## 2023-01-26 LAB — SAVE SMEAR(SSMR), FOR PROVIDER SLIDE REVIEW

## 2023-01-26 LAB — LACTATE DEHYDROGENASE: LDH: 110 U/L (ref 98–192)

## 2023-01-27 ENCOUNTER — Other Ambulatory Visit (HOSPITAL_COMMUNITY): Payer: Self-pay

## 2023-02-02 LAB — BCR/ABL

## 2023-02-03 ENCOUNTER — Telehealth: Payer: Self-pay

## 2023-02-03 NOTE — Telephone Encounter (Signed)
-----   Message from Erenest Blank, NP sent at 02/03/2023 12:05 PM EDT ----- She is still in remission!!!!! WOO HOO!!!   ----- Message ----- From: Interface, Lab In [place] Sent: 01/26/2023  11:47 AM EDT To: Erenest Blank, NP

## 2023-02-03 NOTE — Telephone Encounter (Signed)
Advised via MyChart.

## 2023-02-08 ENCOUNTER — Other Ambulatory Visit (HOSPITAL_COMMUNITY): Payer: Self-pay

## 2023-02-10 ENCOUNTER — Other Ambulatory Visit (HOSPITAL_COMMUNITY): Payer: Self-pay

## 2023-02-13 ENCOUNTER — Other Ambulatory Visit (HOSPITAL_COMMUNITY): Payer: Self-pay

## 2023-02-14 ENCOUNTER — Other Ambulatory Visit: Payer: Self-pay | Admitting: Internal Medicine

## 2023-02-14 ENCOUNTER — Encounter (HOSPITAL_COMMUNITY): Payer: Self-pay

## 2023-02-14 ENCOUNTER — Ambulatory Visit (HOSPITAL_COMMUNITY): Admission: RE | Admit: 2023-02-14 | Payer: BC Managed Care – PPO | Source: Ambulatory Visit

## 2023-02-14 DIAGNOSIS — R0600 Dyspnea, unspecified: Secondary | ICD-10-CM

## 2023-02-14 DIAGNOSIS — R55 Syncope and collapse: Secondary | ICD-10-CM

## 2023-02-17 ENCOUNTER — Other Ambulatory Visit (HOSPITAL_COMMUNITY): Payer: Self-pay

## 2023-02-20 ENCOUNTER — Other Ambulatory Visit (HOSPITAL_COMMUNITY): Payer: Self-pay

## 2023-02-24 ENCOUNTER — Telehealth: Payer: Self-pay | Admitting: Internal Medicine

## 2023-02-24 ENCOUNTER — Encounter: Payer: Self-pay | Admitting: Internal Medicine

## 2023-02-24 DIAGNOSIS — R0600 Dyspnea, unspecified: Secondary | ICD-10-CM

## 2023-02-24 DIAGNOSIS — R55 Syncope and collapse: Secondary | ICD-10-CM

## 2023-02-24 MED ORDER — METOPROLOL TARTRATE 50 MG PO TABS
50.0000 mg | ORAL_TABLET | Freq: Once | ORAL | 0 refills | Status: DC
Start: 2023-02-24 — End: 2023-02-24

## 2023-02-24 MED ORDER — METOPROLOL TARTRATE 50 MG PO TABS
50.0000 mg | ORAL_TABLET | Freq: Once | ORAL | 0 refills | Status: DC
Start: 2023-02-24 — End: 2023-02-27

## 2023-02-24 NOTE — Telephone Encounter (Signed)
Returned call to patient informing her Rx for one time dose of Lopressor was sent to pharmacy requested for CT scan scheduled for 03/06/23.  Patient verbalized understanding and expressed appreciation for assistance.

## 2023-02-24 NOTE — Telephone Encounter (Signed)
Pt c/o medication issue:  1. Name of Medication:   metoprolol tartrate (LOPRESSOR) 50 MG tablet (Expired)    2. How are you currently taking this medication (dosage and times per day)? Will take prior to her CT  3. Are you having a reaction (difficulty breathing--STAT)? no  4. What is your medication issue? Patient states the pharmacy never received the medication so she was not able to have her CT. She requests it be sent again to [facility] on [address]. She would like a call back so when it is sent.

## 2023-02-26 ENCOUNTER — Other Ambulatory Visit: Payer: Self-pay | Admitting: Internal Medicine

## 2023-02-26 DIAGNOSIS — R0600 Dyspnea, unspecified: Secondary | ICD-10-CM

## 2023-02-26 DIAGNOSIS — R55 Syncope and collapse: Secondary | ICD-10-CM

## 2023-02-27 LAB — HM PAP SMEAR: HPV, high-risk: NEGATIVE

## 2023-03-03 ENCOUNTER — Encounter: Payer: BC Managed Care – PPO | Admitting: Internal Medicine

## 2023-03-05 ENCOUNTER — Other Ambulatory Visit: Payer: Self-pay | Admitting: Internal Medicine

## 2023-03-06 ENCOUNTER — Ambulatory Visit (HOSPITAL_COMMUNITY): Admission: RE | Admit: 2023-03-06 | Payer: BC Managed Care – PPO | Source: Ambulatory Visit

## 2023-03-08 ENCOUNTER — Encounter: Payer: BC Managed Care – PPO | Admitting: Internal Medicine

## 2023-03-13 ENCOUNTER — Telehealth (HOSPITAL_COMMUNITY): Payer: Self-pay | Admitting: *Deleted

## 2023-03-13 NOTE — Telephone Encounter (Signed)
Reaching out to patient to offer assistance regarding upcoming cardiac imaging study; pt verbalizes understanding of appt date/time, parking situation and where to check in, pre-test NPO status and medications ordered, and verified current allergies; name and call back number provided for further questions should they arise  Larey Brick RN Navigator Cardiac Imaging [facility] [phone] office [phone] cell  Patient to take 50mg  metoprolol tartrate two hours prior to her cardiac CT scan. She is aware to arrive at 1:30 PM.

## 2023-03-13 NOTE — Telephone Encounter (Signed)
Attempted to call patient regarding upcoming cardiac CT appointment. °Left message on voicemail with name and callback number ° °Merle Prescott RN Navigator Cardiac Imaging °[facility] °[phone] Office °[phone] Cell ° °

## 2023-03-14 ENCOUNTER — Ambulatory Visit (HOSPITAL_COMMUNITY)
Admission: RE | Admit: 2023-03-14 | Discharge: 2023-03-14 | Disposition: A | Discharge status: Home / Self Care | Payer: BC Managed Care – PPO | Source: Ambulatory Visit | Attending: Internal Medicine | Admitting: Internal Medicine

## 2023-03-14 DIAGNOSIS — R9430 Abnormal result of cardiovascular function study, unspecified: Secondary | ICD-10-CM | POA: Diagnosis not present

## 2023-03-14 DIAGNOSIS — R55 Syncope and collapse: Secondary | ICD-10-CM | POA: Insufficient documentation

## 2023-03-14 DIAGNOSIS — R0600 Dyspnea, unspecified: Secondary | ICD-10-CM | POA: Insufficient documentation

## 2023-03-14 MED ORDER — NITROGLYCERIN 0.4 MG SL SUBL
0.8000 mg | SUBLINGUAL_TABLET | SUBLINGUAL | Status: DC | PRN
  Administered 2023-03-14: 0.8 mg via SUBLINGUAL

## 2023-03-14 MED ORDER — IOHEXOL 350 MG/ML SOLN
95.0000 mL | Freq: Once | INTRAVENOUS | Status: AC | PRN
  Administered 2023-03-14: 95 mL via INTRAVENOUS

## 2023-03-14 MED ORDER — NITROGLYCERIN 0.4 MG SL SUBL
SUBLINGUAL_TABLET | SUBLINGUAL | Status: AC
  Filled 2023-03-14: qty 2

## 2023-03-14 NOTE — Progress Notes (Signed)
Pt verbalized understanding of discharge instruction; opportunity for questions provided ?

## 2023-03-29 ENCOUNTER — Encounter: Payer: Self-pay | Admitting: Internal Medicine

## 2023-03-29 ENCOUNTER — Ambulatory Visit (INDEPENDENT_AMBULATORY_CARE_PROVIDER_SITE_OTHER): Payer: BC Managed Care – PPO | Admitting: Internal Medicine

## 2023-03-29 VITALS — BP 112/78 | HR 65 | Temp 98.3°F | Resp 18 | Ht 67.0 in | Wt 211.0 lb

## 2023-03-29 DIAGNOSIS — E559 Vitamin D deficiency, unspecified: Secondary | ICD-10-CM

## 2023-03-29 DIAGNOSIS — R5383 Other fatigue: Secondary | ICD-10-CM

## 2023-03-29 DIAGNOSIS — E785 Hyperlipidemia, unspecified: Secondary | ICD-10-CM | POA: Insufficient documentation

## 2023-03-29 DIAGNOSIS — Z0001 Encounter for general adult medical examination with abnormal findings: Secondary | ICD-10-CM

## 2023-03-29 DIAGNOSIS — R739 Hyperglycemia, unspecified: Secondary | ICD-10-CM

## 2023-03-29 NOTE — Patient Instructions (Addendum)
Watch your diet closely, eat less carbohydrates and starches Exercise goal: 3 hours a week   GO TO THE LAB : Get the blood work     GO TO THE FRONT DESK, PLEASE SCHEDULE YOUR APPOINTMENTS Come back for a checkup in 3 months

## 2023-03-29 NOTE — Progress Notes (Signed)
Subjective:    Patient ID: [name], female    DOB: [birth year], 52 y.o.   MRN: [id number]  DOS:  03/29/2023 Type of visit - description: CPX  Since LOV, saw cardiology, workup reviewed.  No further syncope. She reports that she is tired all the time. She is unsure if she snores but her son tells her that when she takes a nap she sometimes "stops breathing".   Review of Systems  Other than above, a 14 point review of systems is negative    Past Medical History:  Diagnosis Date   Anxiety and depression 04/02/2013   CML (chronic myelocytic leukemia) (HCC) 2013   Glaucoma suspect    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)    Past Surgical History:  Procedure Laterality Date   CESAREAN SECTION  2006 & 2012   TUBAL LIGATION     Social History   Socioeconomic History   Marital status: Divorced    Spouse name: Financial planner   Number of children: 2   Years of education: Not on file   Highest education level: Master's degree (e.g., MA, MS, MEng, MEd, MSW, MBA)  Occupational History   Occupation: TEACHER    Employer: [employer]  Tobacco Use   Smoking status: Never   Smokeless tobacco: Never   Tobacco comments:    never used tobacco  Vaping Use   Vaping status: Never Used  Substance and Sexual Activity   Alcohol use: No    Alcohol/week: 0.0 standard drinks of alcohol   Drug use: No   Sexual activity: Yes    Partners: Male  Other Topics Concern   Not on file  Social History Narrative   From [place]    Separated from Husband 2022   2 children :   female  2006, female  2012      Social Determinants of Health   Financial Resource Strain: Low Risk  (11/03/2022)   Overall Financial Resource Strain (CARDIA)    Difficulty of Paying Living Expenses: Not very hard  Food Insecurity: No Food Insecurity (11/03/2022)   Hunger Vital Sign    Worried About Running Out of Food in the Last Year: Never true    Ran Out of Food in the Last Year: Never true   Transportation Needs: No Transportation Needs (11/03/2022)   PRAPARE - Administrator, Civil Service (Medical): No    Lack of Transportation (Non-Medical): No  Physical Activity: Sufficiently Active (11/03/2022)   Exercise Vital Sign    Days of Exercise per Week: 5 days    Minutes of Exercise per Session: 30 min  Stress: Stress Concern Present (11/03/2022)   [employer] of Occupational Health - Occupational Stress Questionnaire    Feeling of Stress : Very much  Social Connections: Socially Isolated (11/03/2022)   Social Connection and Isolation Panel [NHANES]    Frequency of Communication with Friends and Family: More than three times a week    Frequency of Social Gatherings with Friends and Family: Never    Attends Religious Services: Never    Database administrator or Organizations: No    Attends Engineer, structural: Not on file    Marital Status: Separated  Intimate Partner Violence: Not on file     Current Outpatient Medications  Medication Instructions   calcium carbonate (OS-CAL - DOSED IN MG OF ELEMENTAL CALCIUM) 1250 (500 Ca) MG tablet 1 tablet, Oral   dasatinib (SPRYCEL)  50 MG tablet TAKE 1 TABLET (50 MG TOTAL) BY MOUTH DAILY.   Ferrous Gluconate-C-Folic Acid (IRON-C PO) Oral   FLUoxetine (PROZAC) 10 MG capsule TAKE 2 CAPSULES(20 MG) BY MOUTH DAILY   fluticasone (FLONASE) 50 MCG/ACT nasal spray 2 sprays, Each Nare, Daily   melatonin 3 mg, Oral, Daily PRN   Vitamins/Minerals TABS Oral, Daily       Objective:   Physical Exam BP 112/78 (BP Location: Left Arm, Patient Position: Sitting, Cuff Size: Normal)   Pulse 65   Temp 98.3 F (36.8 C) (Oral)   Resp 18   Ht 5\' 7"  (1.702 m)   Wt 211 lb (95.7 kg)   SpO2 96%   BMI 33.05 kg/m  General: Well developed, NAD, BMI noted Neck: No  thyromegaly  HEENT:  Normocephalic . Face symmetric, atraumatic. Throat: Symmetric, not crowded Lungs:  CTA B Normal respiratory effort, no intercostal  retractions, no accessory muscle use. Heart: RRR,  no murmur.  Abdomen:  Not distended, soft, non-tender. No rebound or rigidity.   Lower extremities: no pretibial edema bilaterally  Skin: Exposed areas without rash. Not pale. Not jaundice Neurologic:  alert & oriented X3.  Speech normal, gait appropriate for age and unassisted Strength symmetric and appropriate for age.  Psych: Cognition and judgment appear intact.  Cooperative with normal attention span and concentration.  Behavior appropriate. No anxious or depressed appearing.     Assessment    Assessment  Anxiety depression --- dx 2014 ADD dx 2015 , rx  meds 2015 , self d/c ~ 04-2015  CML ---  DX 2013 + PPD, s/p treatment, had a BCG Glaucoma suspect BTL Menopausal age 43 Chronic dermatitis (perianal, GU area) has tried different medications, currently on hydrocortisone as needed Osteopenia T score -1.1 (11-2020)  PLAN Here for CPX  - Td 08/2018 - s/p shingrix   - pnm 23 2017; prevnar 04-2016 - recommend: covid booster and a flu shot this fall -Female care per gyn, PAP  and MMG 03-2021 ([facility]).  Plans to get another mammogram this year -CCS: No previous colonoscopy, Cologuard neg 08-2020.  Next 08-2023 -[place] Ao 05-2014 neg for AAA -Menopausal, age 14    -Diet-exercise : Trying to do better, encourage low carbohydrate diet, increase fruits vegetables.  3 hours of exercise weekly in addition to being active at work -Available labs reviewed, will get FLP A1c vitamin d Syncope, see LOV, since then saw cardiology,   CT coronary morphology with CTA: Minimal noncalcified plaque.  Rx aspirin and atorvastatin (not taking) Echo was normal Monitor with no arrhythmias. No further syncope Dyslipidemia: See above, soft  plaque on CTA, pt did not start atorvastatin, will check a FLP and send a rx w/  results. Aspirin: Patient is concerned about interactions with Sprycel, per up-to-date there is indeed a potential interaction.  Will hold off  ASA for now. Fatigue: Occasionally snores, Epworth scale 7, average; throat not crowded,  monitor the situation. RTC 3 months.

## 2023-03-29 NOTE — Assessment & Plan Note (Signed)
Here for CPX  - Td 08/2018 - s/p shingrix   - pnm 23 2017; prevnar 04-2016 - recommend: covid booster and a flu shot this fall -Female care per gyn, PAP  and MMG 03-2021 ([facility]).  Plans to get another mammogram this year -CCS: No previous colonoscopy, Cologuard neg 08-2020.  Next 08-2023 -[place] Ao 05-2014 neg for AAA -Menopausal, age 52    -Diet-exercise : Trying to do better, encourage low carbohydrate diet, increase fruits vegetables.  3 hours of exercise weekly in addition to being active at work -Available labs reviewed, will get FLP A1c vitamin d

## 2023-03-29 NOTE — Assessment & Plan Note (Signed)
Here for CPX Dyslipidemia: See above, soft  plaque on CTA, pt did not start atorvastatin, will check a FLP and send a rx w/  results. Aspirin: Patient is concerned about interactions with Sprycel, per up-to-date there is indeed a potential interaction.  Will hold off ASA for now. Fatigue: Occasionally snores, Epworth scale 7, average; throat not crowded,  monitor the situation. RTC 3 months.

## 2023-03-29 NOTE — Addendum Note (Signed)
Addended by: Willow Ora E on: 03/29/2023 04:50 PM   Modules accepted: Level of Service

## 2023-03-30 ENCOUNTER — Other Ambulatory Visit (INDEPENDENT_AMBULATORY_CARE_PROVIDER_SITE_OTHER): Payer: BC Managed Care – PPO

## 2023-03-30 ENCOUNTER — Other Ambulatory Visit (HOSPITAL_COMMUNITY): Payer: Self-pay

## 2023-03-30 DIAGNOSIS — R739 Hyperglycemia, unspecified: Secondary | ICD-10-CM

## 2023-03-30 DIAGNOSIS — E785 Hyperlipidemia, unspecified: Secondary | ICD-10-CM

## 2023-03-30 DIAGNOSIS — E559 Vitamin D deficiency, unspecified: Secondary | ICD-10-CM | POA: Diagnosis not present

## 2023-03-30 LAB — LIPID PANEL
Cholesterol: 249 mg/dL — ABNORMAL HIGH (ref 0–200)
HDL: 71.1 mg/dL (ref >39.00)
LDL Cholesterol: 163 mg/dL — ABNORMAL HIGH (ref 0–99)
NonHDL: 177.99
Total CHOL/HDL Ratio: 4
Triglycerides: 73 mg/dL (ref 0.0–149.0)
VLDL: 14.6 mg/dL (ref 0.0–40.0)

## 2023-03-30 LAB — HEMOGLOBIN A1C: Hgb A1c MFr Bld: 5.7 % (ref 4.6–6.5)

## 2023-03-30 LAB — VITAMIN D 25 HYDROXY (VIT D DEFICIENCY, FRACTURES): VITD: 23.55 ng/mL — ABNORMAL LOW (ref 30.00–100.00)

## 2023-03-30 NOTE — Addendum Note (Signed)
Addended by: Mervin Kung A on: 03/30/2023 08:17 AM   Modules accepted: Orders

## 2023-03-31 ENCOUNTER — Telehealth: Payer: Self-pay | Admitting: *Deleted

## 2023-03-31 DIAGNOSIS — I2510 Atherosclerotic heart disease of native coronary artery without angina pectoris: Secondary | ICD-10-CM

## 2023-03-31 MED ORDER — ATORVASTATIN CALCIUM 20 MG PO TABS: 20.0000 mg | ORAL_TABLET | Freq: Every day | ORAL | 3 refills | Status: DC

## 2023-03-31 NOTE — Telephone Encounter (Signed)
Spoke w patient.  She is aware to not start aspirin due to interaction w Sprycel.  She will start atorvastatin 20 mg - one tablet daily.  Will plan to check lipids, liver and Lp(a) at [facility] on 06/01/23.

## 2023-03-31 NOTE — Telephone Encounter (Signed)
-----   Message from Orbie Pyo sent at 03/31/2023  9:46 AM EDT ----- Joen Laura start atorvastatin 20 mg and check a lipid panel, LFTs, LP(a) in 2 months. Let's have her follow up in 1 year with APP

## 2023-04-03 NOTE — Telephone Encounter (Signed)
Mailed lab reqs to patient's home to take w her to lab appointment in October.

## 2023-04-04 ENCOUNTER — Other Ambulatory Visit (HOSPITAL_COMMUNITY): Payer: Self-pay

## 2023-04-04 MED ORDER — VITAMIN D (ERGOCALCIFEROL) 1.25 MG (50000 UNIT) PO CAPS: 50000.0000 [IU] | ORAL_CAPSULE | ORAL | 0 refills | Status: AC

## 2023-04-04 NOTE — Addendum Note (Signed)
[place] on: 04/04/2023 03:01 PM   Modules accepted: Orders

## 2023-04-06 ENCOUNTER — Encounter: Payer: Self-pay | Admitting: Internal Medicine

## 2023-04-11 ENCOUNTER — Encounter: Payer: Self-pay | Admitting: Internal Medicine

## 2023-04-27 ENCOUNTER — Other Ambulatory Visit (HOSPITAL_COMMUNITY): Payer: Self-pay

## 2023-05-01 ENCOUNTER — Other Ambulatory Visit: Payer: Self-pay

## 2023-05-02 ENCOUNTER — Other Ambulatory Visit (HOSPITAL_COMMUNITY): Payer: Self-pay

## 2023-05-02 ENCOUNTER — Other Ambulatory Visit: Payer: Self-pay

## 2023-05-11 ENCOUNTER — Other Ambulatory Visit: Payer: Self-pay

## 2023-05-23 ENCOUNTER — Other Ambulatory Visit: Payer: Self-pay

## 2023-05-31 ENCOUNTER — Other Ambulatory Visit: Payer: Self-pay

## 2023-05-31 DIAGNOSIS — C9210 Chronic myeloid leukemia, BCR/ABL-positive, not having achieved remission: Secondary | ICD-10-CM

## 2023-06-01 ENCOUNTER — Inpatient Hospital Stay: Payer: BC Managed Care – PPO | Attending: Hematology & Oncology

## 2023-06-01 DIAGNOSIS — C9212 Chronic myeloid leukemia, BCR/ABL-positive, in relapse: Secondary | ICD-10-CM | POA: Diagnosis present

## 2023-06-01 DIAGNOSIS — Z79899 Other long term (current) drug therapy: Secondary | ICD-10-CM | POA: Diagnosis not present

## 2023-06-01 DIAGNOSIS — C9210 Chronic myeloid leukemia, BCR/ABL-positive, not having achieved remission: Secondary | ICD-10-CM

## 2023-06-01 LAB — CBC WITH DIFFERENTIAL (CANCER CENTER ONLY)
Abs Immature Granulocytes: 0.01 10*3/uL (ref 0.00–0.07)
Basophils Absolute: 0 10*3/uL (ref 0.0–0.1)
Basophils Relative: 1 %
Eosinophils Absolute: 0.1 10*3/uL (ref 0.0–0.5)
Eosinophils Relative: 3 %
HCT: 33.8 % — ABNORMAL LOW (ref 36.0–46.0)
Hemoglobin: 11.3 g/dL — ABNORMAL LOW (ref 12.0–15.0)
Immature Granulocytes: 0 %
Lymphocytes Relative: 45 %
Lymphs Abs: 1.7 10*3/uL (ref 0.7–4.0)
MCH: 30.1 pg (ref 26.0–34.0)
MCHC: 33.4 g/dL (ref 30.0–36.0)
MCV: 89.9 fL (ref 80.0–100.0)
Monocytes Absolute: 0.2 10*3/uL (ref 0.1–1.0)
Monocytes Relative: 6 %
Neutro Abs: 1.7 10*3/uL (ref 1.7–7.7)
Neutrophils Relative %: 45 %
Platelet Count: 188 10*3/uL (ref 150–400)
RBC: 3.76 MIL/uL — ABNORMAL LOW (ref 3.87–5.11)
RDW: 13.5 % (ref 11.5–15.5)
WBC Count: 3.7 10*3/uL — ABNORMAL LOW (ref 4.0–10.5)
nRBC: 0 % (ref 0.0–0.2)

## 2023-06-01 LAB — CMP (CANCER CENTER ONLY)
ALT: 17 U/L (ref 0–44)
AST: 20 U/L (ref 15–41)
Albumin: 4.1 g/dL (ref 3.5–5.0)
Alkaline Phosphatase: 65 U/L (ref 38–126)
Anion gap: 7 (ref 5–15)
BUN: 17 mg/dL (ref 6–20)
CO2: 29 mmol/L (ref 22–32)
Calcium: 9.1 mg/dL (ref 8.9–10.3)
Chloride: 105 mmol/L (ref 98–111)
Creatinine: 0.81 mg/dL (ref 0.44–1.00)
GFR, Estimated: >60 mL/min (ref >60)
Glucose, Bld: 101 mg/dL — ABNORMAL HIGH (ref 70–99)
Potassium: 4 mmol/L (ref 3.5–5.1)
Sodium: 141 mmol/L (ref 135–145)
Total Bilirubin: 0.3 mg/dL (ref 0.3–1.2)
Total Protein: 6.6 g/dL (ref 6.5–8.1)

## 2023-06-01 LAB — LACTATE DEHYDROGENASE: LDH: 101 U/L (ref 98–192)

## 2023-06-01 LAB — SAVE SMEAR(SSMR), FOR PROVIDER SLIDE REVIEW

## 2023-06-02 ENCOUNTER — Other Ambulatory Visit: Payer: Self-pay | Admitting: Internal Medicine

## 2023-06-02 ENCOUNTER — Inpatient Hospital Stay (HOSPITAL_BASED_OUTPATIENT_CLINIC_OR_DEPARTMENT_OTHER): Payer: BC Managed Care – PPO | Admitting: Hematology & Oncology

## 2023-06-02 ENCOUNTER — Encounter: Payer: Self-pay | Admitting: Hematology & Oncology

## 2023-06-02 VITALS — BP 126/64 | HR 65 | Temp 98.3°F | Resp 20 | Ht 67.0 in | Wt 206.0 lb

## 2023-06-02 DIAGNOSIS — C9210 Chronic myeloid leukemia, BCR/ABL-positive, not having achieved remission: Secondary | ICD-10-CM | POA: Diagnosis not present

## 2023-06-02 DIAGNOSIS — C9212 Chronic myeloid leukemia, BCR/ABL-positive, in relapse: Secondary | ICD-10-CM | POA: Diagnosis not present

## 2023-06-02 NOTE — Progress Notes (Signed)
Hematology and Oncology Follow Up Visit  [name] [id number] 05/29/1971 52 y.o. 06/02/2023   Principle Diagnosis:  Chronic phase CML- major molecular remission   Current Therapy:        Sprycel 50 mg by mouth daily -DC on 06/02/2023   Interim History:  [name] is here today for follow-up.  We last saw her 6 months ago.  She has had a pretty busy summer.  She is back to teaching.  She is still separated from her husband.  It has  been about 3 years now.  Her 2 children are managing.  I know that it is challenging to have the parents not being together.  Hopefully, she will have her parents come up from [place] over the [place] season.  Her last BCR/ABL was undetectable.  As such, she is still in a major molecular remission.  At this point, I think we can stop the Sprycel.  She has been on Suboxone for 10 years.  She has had undetectable BCR/ABL for the past several years.  I think would be reasonable to stop the Sprycel and watch her.  She has had no problems with bowels or bladder.  She has had no nausea or vomiting.  She has had no rashes.  She has had no bleeding.  Her birthday is on [birth year].  This will be a wonderful birthday present for her in which she does not have to take Sprycel.  Overall, I would say that her performance status is probably ECOG 0.     Medications:  Allergies as of 06/02/2023       Reactions   Chocolate Flavor Hives        Medication List        Accurate as of June 02, 2023  9:18 AM. If you have any questions, ask your nurse or doctor.          STOP taking these medications    melatonin 3 MG Tabs tablet Stopped by: Josph Macho       TAKE these medications    atorvastatin 20 MG tablet Commonly known as: LIPITOR Take 1 tablet (20 mg total) by mouth daily.   calcium carbonate 1250 (500 Ca) MG tablet Commonly known as: OS-CAL - dosed in mg of elemental calcium Take 1 tablet by mouth daily.   FLUoxetine 10 MG  capsule Commonly known as: PROZAC Take 2 capsules (20 mg total) by mouth daily. What changed: See the new instructions. Changed by: Willow Ora   fluticasone 50 MCG/ACT nasal spray Commonly known as: FLONASE Place 2 sprays into both nostrils daily.   IRON-C PO Take by mouth daily.   Magnesium 100 MG Caps 200 mg daily.   nystatin-triamcinolone ointment Commonly known as: MYCOLOG daily as needed.   Sprycel 50 MG tablet Generic drug: dasatinib Tome 1 tableta (50 mg en total) por va oral diariamente. (TAKE 1 TABLET (50 MG TOTAL) BY MOUTH DAILY.)   Vitamin D (Ergocalciferol) 1.25 MG (50000 UNIT) Caps capsule Commonly known as: DRISDOL Take 1 capsule (50,000 Units total) by mouth every 7 (seven) days.   Vitamins/Minerals Tabs Take by mouth daily.        Allergies:  Allergies  Allergen Reactions   Chocolate Flavor Hives    Past Medical History, Surgical history, Social history, and Family History were reviewed and updated.  Review of Systems: Review of Systems  Constitutional: Negative.   HENT: Negative.    Eyes:  Positive for discharge and redness.  Respiratory:  Negative.    Cardiovascular: Negative.   Gastrointestinal: Negative.   Genitourinary: Negative.   Musculoskeletal: Negative.   Skin:  Positive for itching and rash.  Neurological: Negative.   Endo/Heme/Allergies: Negative.   Psychiatric/Behavioral: Negative.       Physical Exam:  height is 5\' 7"  (1.702 m) and weight is 206 lb (93.4 kg). Her oral temperature is 98.3 F (36.8 C). Her blood pressure is 126/64 and her pulse is 65. Her respiration is 20 and oxygen saturation is 99%.   Wt Readings from Last 3 Encounters:  06/02/23 206 lb (93.4 kg)  03/29/23 211 lb (95.7 kg)  11/25/22 217 lb 12.8 oz (98.8 kg)   Her vital signs are temperature of 98.1.  Pulse 66.  Blood pressure 126/64.  Weight is 206 pounds.  Physical Exam Vitals reviewed.  HENT:     Head: Normocephalic and atraumatic.      Comments: Head and neck exam shows erythema and swelling under the left eye.  Maybe a little bit of erythema with the conjunctive of the left eye.  Her pupil reacts appropriately.  There is no adenopathy in the neck. Eyes:     Pupils: Pupils are equal, round, and reactive to light.  Cardiovascular:     Rate and Rhythm: Normal rate and regular rhythm.     Heart sounds: Normal heart sounds.  Pulmonary:     Effort: Pulmonary effort is normal.     Breath sounds: Normal breath sounds.  Abdominal:     General: Bowel sounds are normal.     Palpations: Abdomen is soft.  Musculoskeletal:        General: No tenderness or deformity. Normal range of motion.     Cervical back: Normal range of motion.  Lymphadenopathy:     Cervical: No cervical adenopathy.  Skin:    General: Skin is warm and dry.     Findings: No erythema or rash.     Comments: She has some scattered papular type lesions on her arms secondary to the poison ivy.  Neurological:     Mental Status: She is alert and oriented to person, place, and time.  Psychiatric:        Behavior: Behavior normal.        Thought Content: Thought content normal.        Judgment: Judgment normal.      Lab Results  Component Value Date   WBC 3.7 (L) 06/01/2023   HGB 11.3 (L) 06/01/2023   HCT 33.8 (L) 06/01/2023   MCV 89.9 06/01/2023   PLT 188 06/01/2023   Lab Results  Component Value Date   FERRITIN 170 05/26/2022   IRON 41 05/26/2022   TIBC 323 05/26/2022   UIBC 282 05/26/2022   IRONPCTSAT 13 05/26/2022   Lab Results  Component Value Date   RBC 3.76 (L) 06/01/2023   No results found for: "KPAFRELGTCHN", "LAMBDASER", "KAPLAMBRATIO" No results found for: "IGGSERUM", "IGA", "IGMSERUM" No results found for: "TOTALPROTELP", "ALBUMINELP", "A1GS", "A2GS", "BETS", "BETA2SER", "GAMS", "MSPIKE", "SPEI"   Chemistry      Component Value Date/Time   NA 141 06/01/2023 1312   NA 143 08/10/2017 1518   K 4.0 06/01/2023 1312   K 4.2 08/10/2017  1518   CL 105 06/01/2023 1312   CL 106 08/10/2017 1518   CO2 29 06/01/2023 1312   CO2 31 08/10/2017 1518   BUN 17 06/01/2023 1312   BUN 21 08/10/2017 1518   CREATININE 0.81 06/01/2023 1312   CREATININE 0.8 08/10/2017  1518      Component Value Date/Time   CALCIUM 9.1 06/01/2023 1312   CALCIUM 9.2 08/10/2017 1518   ALKPHOS 65 06/01/2023 1312   ALKPHOS 65 08/10/2017 1518   AST 20 06/01/2023 1312   ALT 17 06/01/2023 1312   ALT 24 08/10/2017 1518   BILITOT 0.3 06/01/2023 1312       Impression and Plan: Ms. Menne is a very pleasant 52 yo Hispanic female with chronic phase CML in MMR.  She did have a relapse back in 2017.  We have her on Sprycel for the relapse.  Again, I think we can hold the Sprycel for right now.  We will plan to have her come back in 3-4 months.  Will have to watch the BCR/ABL closely.  I am just happy that we can get her off the Sprycel.  I know that she will have a wonderful birthday and a very enjoyable Holiday season. Marland Kitchen    Josph Macho, MD 10/18/20249:18 AM

## 2023-06-07 ENCOUNTER — Other Ambulatory Visit: Payer: Self-pay

## 2023-06-07 LAB — BCR/ABL

## 2023-06-20 ENCOUNTER — Ambulatory Visit: Payer: BC Managed Care – PPO | Admitting: Internal Medicine

## 2023-06-23 ENCOUNTER — Other Ambulatory Visit: Payer: Self-pay | Admitting: Family

## 2023-06-23 ENCOUNTER — Ambulatory Visit: Payer: BC Managed Care – PPO | Admitting: Family

## 2023-06-23 ENCOUNTER — Encounter: Payer: Self-pay | Admitting: Family

## 2023-06-23 VITALS — BP 120/84 | HR 76 | Resp 18 | Ht 67.0 in | Wt 210.4 lb

## 2023-06-23 DIAGNOSIS — J209 Acute bronchitis, unspecified: Secondary | ICD-10-CM | POA: Diagnosis not present

## 2023-06-23 MED ORDER — AZITHROMYCIN 250 MG PO TABS: ORAL_TABLET | ORAL | 0 refills | Status: DC

## 2023-06-23 NOTE — Progress Notes (Signed)
[name] is a 52 y.o. 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 90 tablet 3   azithromycin (ZITHROMAX) 250 MG tablet Take 2 tab po the first day then take 1 tablet po daily for 4 days 6 tablet 0   calcium carbonate (OS-CAL - DOSED IN MG OF ELEMENTAL CALCIUM) 1250 (500 Ca) MG tablet Take 1 tablet by mouth daily.     Ferrous Gluconate-C-Folic Acid (IRON-C PO) Take by mouth daily.     FLUoxetine (PROZAC) 10 MG capsule Take 2 capsules (20 mg total) by mouth daily. 180 capsule 0   fluticasone (FLONASE) 50 MCG/ACT nasal spray Place 2 sprays into both nostrils daily. 16 g 6   Magnesium 100 MG CAPS 200 mg daily.     nystatin-triamcinolone ointment (MYCOLOG) daily as needed.     Vitamin D, Ergocalciferol, (DRISDOL) 1.25 MG (50000 UNIT) CAPS capsule Take 1 capsule (50,000 Units total) by mouth every 7 (seven) days. 12 capsule 0   Vitamins/Minerals TABS Take by mouth daily.      dasatinib (SPRYCEL) 50 MG tablet TAKE 1 TABLET (50 MG TOTAL) BY MOUTH DAILY. (Patient not taking: Reported on 06/23/2023) 30 tablet 12   No current facility-administered medications for this visit.    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)    Past Surgical History:  Procedure Laterality Date   CESAREAN SECTION  2006 & 2012   TUBAL LIGATION      Family History  Problem Relation Age of Onset   Hypertension Maternal Grandmother    Cancer Maternal Grandfather    Heart disease Paternal Grandmother    Stroke Paternal Grandfather    Diabetes Neg Hx    Colon cancer Neg Hx    Breast cancer Neg Hx     Social History   Tobacco Use   Smoking status: Never   Smokeless tobacco: Never   Tobacco comments:    never used tobacco  Substance Use Topics   Alcohol use: No    Alcohol/week: 0.0 standard drinks of alcohol    Subjective:   Cough/ congestion x 2-3 weeks; notes that son had pneumonia in the past 2-3 weeks; no fever; using OTC cough/ cold medication with limited relief;   Objective:  Vitals:   06/23/23 1311  BP: 120/84  Pulse: 76  Resp: 18  SpO2: 95%  Weight: 210 lb 6.4 oz (95.4 kg)  Height: 5\' 7"  (1.702 m)    General: Well developed, well nourished, in no acute distress  Skin : Warm and dry.  Head: Normocephalic and atraumatic  Eyes: Sclera and conjunctiva clear; pupils round and reactive to light; extraocular movements  intact  Ears: External normal; canals clear; tympanic membranes normal  Oropharynx: Pink, supple. No suspicious lesions  Neck: Supple without thyromegaly, adenopathy  Lungs: Respirations unlabored; clear to auscultation bilaterally without wheeze, rales, rhonchi  CVS exam: normal rate and regular rhythm.  Neurologic: Alert and oriented; speech intact; face symmetrical; moves all extremities well; CNII-XII intact without focal deficit   Assessment:  1. Acute bronchitis, unspecified organism     Plan:  Suspect allergy component; encouraged to re-start her Flonase; Rx for Z-pak- take as directed; increase fluids, rest and follow up worse, no better.   No follow-ups on file.  No orders of the defined types were placed in this  encounter.   Requested Prescriptions   Signed Prescriptions Disp Refills   azithromycin (ZITHROMAX) 250 MG tablet 6 tablet 0    Sig: Take 2 tab po the first day then take 1 tablet po daily for 4 days

## 2023-06-26 ENCOUNTER — Ambulatory Visit: Payer: BC Managed Care – PPO | Admitting: Internal Medicine

## 2023-07-05 ENCOUNTER — Other Ambulatory Visit: Payer: Self-pay | Admitting: Internal Medicine

## 2023-07-06 ENCOUNTER — Encounter: Payer: Self-pay | Admitting: Internal Medicine

## 2023-07-18 DIAGNOSIS — S82822A Torus fracture of lower end of left fibula, initial encounter for closed fracture: Secondary | ICD-10-CM | POA: Insufficient documentation

## 2023-07-21 ENCOUNTER — Other Ambulatory Visit: Payer: Self-pay

## 2023-07-31 ENCOUNTER — Other Ambulatory Visit (HOSPITAL_COMMUNITY): Payer: Self-pay

## 2023-08-14 ENCOUNTER — Encounter: Payer: Self-pay | Admitting: Family

## 2023-09-06 ENCOUNTER — Other Ambulatory Visit: Payer: Self-pay | Admitting: Internal Medicine

## 2023-09-11 LAB — HM MAMMOGRAPHY

## 2023-09-13 ENCOUNTER — Other Ambulatory Visit: Payer: Self-pay

## 2023-09-13 NOTE — Progress Notes (Signed)
Spryel discontinued. Disenrolled from Transport planner.

## 2023-10-02 ENCOUNTER — Inpatient Hospital Stay: Payer: 59 | Admitting: Hematology & Oncology

## 2023-10-02 ENCOUNTER — Inpatient Hospital Stay: Payer: 59

## 2023-10-02 ENCOUNTER — Encounter: Payer: Self-pay | Admitting: Family

## 2023-10-07 ENCOUNTER — Other Ambulatory Visit: Payer: Self-pay | Admitting: Internal Medicine

## 2023-10-18 ENCOUNTER — Encounter: Payer: Self-pay | Admitting: Hematology & Oncology

## 2023-10-18 ENCOUNTER — Inpatient Hospital Stay (HOSPITAL_BASED_OUTPATIENT_CLINIC_OR_DEPARTMENT_OTHER): Payer: 59 | Admitting: Hematology & Oncology

## 2023-10-18 ENCOUNTER — Inpatient Hospital Stay: Payer: 59 | Attending: Hematology & Oncology

## 2023-10-18 ENCOUNTER — Other Ambulatory Visit: Payer: Self-pay | Admitting: Oncology

## 2023-10-18 VITALS — BP 126/72 | HR 65 | Temp 98.5°F | Resp 20 | Ht 67.0 in | Wt 221.0 lb

## 2023-10-18 DIAGNOSIS — C9210 Chronic myeloid leukemia, BCR/ABL-positive, not having achieved remission: Secondary | ICD-10-CM

## 2023-10-18 DIAGNOSIS — Z79899 Other long term (current) drug therapy: Secondary | ICD-10-CM | POA: Diagnosis not present

## 2023-10-18 LAB — CMP (CANCER CENTER ONLY)
ALT: 18 U/L (ref 0–44)
AST: 19 U/L (ref 15–41)
Albumin: 4.3 g/dL (ref 3.5–5.0)
Alkaline Phosphatase: 78 U/L (ref 38–126)
Anion gap: 6 (ref 5–15)
BUN: 26 mg/dL — ABNORMAL HIGH (ref 6–20)
CO2: 28 mmol/L (ref 22–32)
Calcium: 9.5 mg/dL (ref 8.9–10.3)
Chloride: 105 mmol/L (ref 98–111)
Creatinine: 0.82 mg/dL (ref 0.44–1.00)
GFR, Estimated: >60 mL/min (ref >60)
Glucose, Bld: 97 mg/dL (ref 70–99)
Potassium: 4.2 mmol/L (ref 3.5–5.1)
Sodium: 139 mmol/L (ref 135–145)
Total Bilirubin: 0.4 mg/dL (ref 0.0–1.2)
Total Protein: 6.8 g/dL (ref 6.5–8.1)

## 2023-10-18 LAB — CBC WITH DIFFERENTIAL (CANCER CENTER ONLY)
Abs Immature Granulocytes: 0.03 10*3/uL (ref 0.00–0.07)
Basophils Absolute: 0 10*3/uL (ref 0.0–0.1)
Basophils Relative: 1 %
Eosinophils Absolute: 0.1 10*3/uL (ref 0.0–0.5)
Eosinophils Relative: 2 %
HCT: 35.8 % — ABNORMAL LOW (ref 36.0–46.0)
Hemoglobin: 12 g/dL (ref 12.0–15.0)
Immature Granulocytes: 1 %
Lymphocytes Relative: 33 %
Lymphs Abs: 1.7 10*3/uL (ref 0.7–4.0)
MCH: 29.3 pg (ref 26.0–34.0)
MCHC: 33.5 g/dL (ref 30.0–36.0)
MCV: 87.5 fL (ref 80.0–100.0)
Monocytes Absolute: 0.4 10*3/uL (ref 0.1–1.0)
Monocytes Relative: 8 %
Neutro Abs: 2.8 10*3/uL (ref 1.7–7.7)
Neutrophils Relative %: 55 %
Platelet Count: 182 10*3/uL (ref 150–400)
RBC: 4.09 MIL/uL (ref 3.87–5.11)
RDW: 13.2 % (ref 11.5–15.5)
WBC Count: 5 10*3/uL (ref 4.0–10.5)
nRBC: 0 % (ref 0.0–0.2)

## 2023-10-18 LAB — SAVE SMEAR(SSMR), FOR PROVIDER SLIDE REVIEW

## 2023-10-18 MED ORDER — NYSTATIN-TRIAMCINOLONE 100000-0.1 UNIT/GM-% EX OINT: TOPICAL_OINTMENT | Freq: Every day | CUTANEOUS | 1 refills | Status: AC | PRN

## 2023-10-18 NOTE — Progress Notes (Signed)
 Hematology and Oncology Follow Up Visit  [name] [id number] 12-27-1970 52 y.o. 10/18/2023   Principle Diagnosis:  Chronic phase CML- major molecular remission   Current Therapy:        Sprycel 50 mg by mouth daily -DC on 06/02/2023   Interim History:  [name] is here today for follow-up.  We last saw her 6 months ago.  She unfortunately broke her left fibula back in January.  This happened while she was walking her dog and she slid on black ice.  She did not need a cast.  She tolerated this pretty well.  She is still taking.  There has been a lot of sickness at her high school.  Should be very cautious.  She continues off the Sprycel.  She stopped this in October 2024.  We will see what her BCR/ABL it.  She has had no bleeding.  There is been no change in bowel bladder habits.  She has had no cough or increased shortness of breath.  Has had no rashes.  There is been no leg swelling.  She has had no headache.  Overall, I would have to say that her performance status is probably ECOG 1.    Medications:  Allergies as of 10/18/2023       Reactions   Chocolate Flavoring Agent (non-screening) Hives        Medication List        Accurate as of October 18, 2023  3:57 PM. If you have any questions, ask your nurse or doctor.          STOP taking these medications    azithromycin 250 MG tablet Commonly known as: ZITHROMAX Stopped by: Josph Macho       TAKE these medications    atorvastatin 20 MG tablet Commonly known as: LIPITOR Take 1 tablet (20 mg total) by mouth daily.   calcium carbonate 1250 (500 Ca) MG tablet Commonly known as: OS-CAL - dosed in mg of elemental calcium Take 1 tablet by mouth daily.   FLUoxetine 10 MG capsule Commonly known as: PROZAC TAKE 2 CAPSULES(20 MG) BY MOUTH DAILY   fluticasone 50 MCG/ACT nasal spray Commonly known as: FLONASE Place 2 sprays into both nostrils daily.   IRON-C PO Take by mouth daily.   Magnesium 100 MG  Caps 200 mg daily.   nystatin-triamcinolone ointment Commonly known as: MYCOLOG Apply topically daily as needed. What changed: how to take this Changed by: Josph Macho   Vitamins/Minerals Tabs Take by mouth daily.        Allergies:  Allergies  Allergen Reactions   Chocolate Flavoring Agent (Non-Screening) Hives    Past Medical History, Surgical history, Social history, and Family History were reviewed and updated.  Review of Systems: Review of Systems  Constitutional: Negative.   HENT: Negative.    Eyes:  Positive for discharge and redness.  Respiratory: Negative.    Cardiovascular: Negative.   Gastrointestinal: Negative.   Genitourinary: Negative.   Musculoskeletal: Negative.   Skin:  Positive for itching and rash.  Neurological: Negative.   Endo/Heme/Allergies: Negative.   Psychiatric/Behavioral: Negative.       Physical Exam:  height is 5\' 7"  (1.702 m) and weight is 221 lb (100.2 kg). Her oral temperature is 98.5 F (36.9 C). Her blood pressure is 126/72 and her pulse is 65. Her respiration is 20 and oxygen saturation is 99%.   Wt Readings from Last 3 Encounters:  10/18/23 221 lb (100.2 kg)  06/23/23  210 lb 6.4 oz (95.4 kg)  06/02/23 206 lb (93.4 kg)   Her vital signs are temperature of 98.1.  Pulse 66.  Blood pressure 126/64.  Weight is 206 pounds.  Physical Exam Vitals reviewed.  HENT:     Head: Normocephalic and atraumatic.     Comments: Head and neck exam shows erythema and swelling under the left eye.  Maybe a little bit of erythema with the conjunctive of the left eye.  Her pupil reacts appropriately.  There is no adenopathy in the neck. Eyes:     Pupils: Pupils are equal, round, and reactive to light.  Cardiovascular:     Rate and Rhythm: Normal rate and regular rhythm.     Heart sounds: Normal heart sounds.  Pulmonary:     Effort: Pulmonary effort is normal.     Breath sounds: Normal breath sounds.  Abdominal:     General: Bowel sounds  are normal.     Palpations: Abdomen is soft.  Musculoskeletal:        General: No tenderness or deformity. Normal range of motion.     Cervical back: Normal range of motion.  Lymphadenopathy:     Cervical: No cervical adenopathy.  Skin:    General: Skin is warm and dry.     Findings: No erythema or rash.     Comments: She has some scattered papular type lesions on her arms secondary to the poison ivy.  Neurological:     Mental Status: She is alert and oriented to person, place, and time.  Psychiatric:        Behavior: Behavior normal.        Thought Content: Thought content normal.        Judgment: Judgment normal.     Lab Results  Component Value Date   WBC 5.0 10/18/2023   HGB 12.0 10/18/2023   HCT 35.8 (L) 10/18/2023   MCV 87.5 10/18/2023   PLT 182 10/18/2023   Lab Results  Component Value Date   FERRITIN 170 05/26/2022   IRON 41 05/26/2022   TIBC 323 05/26/2022   UIBC 282 05/26/2022   IRONPCTSAT 13 05/26/2022   Lab Results  Component Value Date   RBC 4.09 10/18/2023   No results found for: "KPAFRELGTCHN", "LAMBDASER", "KAPLAMBRATIO" No results found for: "IGGSERUM", "IGA", "IGMSERUM" No results found for: "TOTALPROTELP", "ALBUMINELP", "A1GS", "A2GS", "BETS", "BETA2SER", "GAMS", "MSPIKE", "SPEI"   Chemistry      Component Value Date/Time   NA 141 06/01/2023 1312   NA 143 08/10/2017 1518   K 4.0 06/01/2023 1312   K 4.2 08/10/2017 1518   CL 105 06/01/2023 1312   CL 106 08/10/2017 1518   CO2 29 06/01/2023 1312   CO2 31 08/10/2017 1518   BUN 17 06/01/2023 1312   BUN 21 08/10/2017 1518   CREATININE 0.81 06/01/2023 1312   CREATININE 0.8 08/10/2017 1518      Component Value Date/Time   CALCIUM 9.1 06/01/2023 1312   CALCIUM 9.2 08/10/2017 1518   ALKPHOS 65 06/01/2023 1312   ALKPHOS 65 08/10/2017 1518   AST 20 06/01/2023 1312   ALT 17 06/01/2023 1312   ALT 24 08/10/2017 1518   BILITOT 0.3 06/01/2023 1312       Impression and Plan: [name] is a very  pleasant 53 yo Hispanic female with chronic phase CML in MMR.  She did have a relapse back in 2017.  We had her on Sprycel for the relapse.  We will see what her BCR/ABL  looks like.  Hopefully, we can still hold her Sprycel.  Maybe, she will go down to [place] this summer.  She will see her family.  Hopefully she will be able to make it down there.  I would like to get her back to see her in 3 or 4 months.   Josph Macho, MD 3/5/20253:57 PM

## 2023-10-19 ENCOUNTER — Encounter: Payer: Self-pay | Admitting: Internal Medicine

## 2023-10-20 ENCOUNTER — Encounter: Payer: Self-pay | Admitting: Internal Medicine

## 2023-10-26 ENCOUNTER — Other Ambulatory Visit: Payer: Self-pay | Admitting: *Deleted

## 2023-10-26 ENCOUNTER — Telehealth: Payer: Self-pay | Admitting: *Deleted

## 2023-10-26 LAB — BCR-ABL1, CML/ALL, PCR, QUANT
E1A2 Transcript: <0.0032 %
b2a2 transcript: 11.5744 %
b3a2 transcript: <0.0032 %

## 2023-10-26 MED ORDER — DASATINIB 50 MG PO TABS
50.0000 mg | ORAL_TABLET | Freq: Every day | ORAL | 2 refills | Status: DC
  Filled 2023-10-27: qty 30, 30d supply, fill #0
  Filled 2024-01-15: qty 30, 30d supply, fill #1
  Filled 2024-02-05: qty 30, 30d supply, fill #2

## 2023-10-26 NOTE — Telephone Encounter (Signed)
 Call received from patient stating that she saw the elevation in her BCR/ABL results on MyChart today and would like to go ahead and get started back on Sprycel.  Dr. Myna Hidalgo notified. Pt instructed per order of Dr. Myna Hidalgo to restart Sprycel 50 mg daily and to return to office in 3 weeks for labs and to see Dr Myna Hidalgo. Sprycel prescription sent to [facility]. Pt is appreciative of assistance and has no further questions at this time.  Message sent to scheduling.

## 2023-10-27 ENCOUNTER — Other Ambulatory Visit: Payer: Self-pay | Admitting: Pharmacy Technician

## 2023-10-27 ENCOUNTER — Encounter: Payer: Self-pay | Admitting: Family

## 2023-10-27 ENCOUNTER — Telehealth: Payer: Self-pay | Admitting: Pharmacy Technician

## 2023-10-27 ENCOUNTER — Other Ambulatory Visit (HOSPITAL_COMMUNITY): Payer: Self-pay

## 2023-10-27 ENCOUNTER — Telehealth: Payer: Self-pay | Admitting: Pharmacist

## 2023-10-27 ENCOUNTER — Other Ambulatory Visit: Payer: Self-pay

## 2023-10-27 NOTE — Telephone Encounter (Signed)
 Oral Oncology Patient Advocate Encounter   Was successful in obtaining a copay card for Sprycel.  This copay card will make the patients copay $0.  The billing information is as follows and has been shared with [facility].   RxBin: [id number] PCN: [id number] Member ID: [id number] Group ID: [id number]   Ella Bodo, CPhT Oncology Pharmacy Patient Advocate [facility] Direct Number: [phone] Fax: [phone]

## 2023-10-27 NOTE — Telephone Encounter (Signed)
 Oral Oncology Patient Advocate Encounter   Received notification that prior authorization for Sprycel is required.   PA submitted on 10/27/2023 Key [id number] Status is pending     Patty Almedia Balls, CPhT Oncology Pharmacy Patient Advocate [facility] Direct Number: [phone] Fax: [phone]

## 2023-10-27 NOTE — Progress Notes (Signed)
 Specialty Pharmacy Initial Fill Coordination Note  [name] is a 53 y.o. female contacted today regarding refills of specialty medication(s) Dasatinib (SPRYCEL) .  Patient requested Delivery  on 11/01/23  to verified address [address] [facility] [address]   Medication will be filled on 03/18.   Patient is aware of $0 copayment. Bill co-pay card.  Patty Almedia Balls, CPhT Oncology Pharmacy Patient Advocate [facility] Direct Number: [phone] Fax: [phone]

## 2023-10-27 NOTE — Progress Notes (Signed)
 Oral Chemotherapy Pharmacist Encounter  Patient was counseled under telephone encounter from 10/27/23.  Sherry Ruffing, PharmD, BCPS, BCOP Hematology/Oncology Clinical Pharmacist [facility] Oral Chemotherapy Navigation Clinics [phone] 10/27/2023 10:19 AM

## 2023-10-27 NOTE — Telephone Encounter (Signed)
 Oral Chemotherapy Pharmacist Encounter  I spoke with patient for overview of: Sprycel (dasatinib) for the treatment of chronic myeloid leukemia, planned duration until disease progression or unacceptable toxicity.  Counseled patient on administration, dosing, side effects, monitoring, drug-food interactions, safe handling, storage, and disposal.  CBC w/ Diff and CMP from 10/18/23 assessed, no relevant lab abnormalities requiring baseline dose adjustment required at this time. Dosing per MD discretion. Prescription dose and frequency assessed for appropriateness.  Patient will take Sprycel 50 mg tablets, 1 tablet by mouth once daily without regard to food.  Patient instructed that administering with food may decrease GI irritation.   Patient knows to avoid grapefruit or grapefruit juice while on therapy with Sprycel.  Sprycel re-start date: 10/28/23  Adverse effects include but are not limited to: fluid retention, nausea, diarrhea, rash, fatigue, dyspnea, hemorrhage, and thrombocytopenia.  Reviewed with patient importance of keeping a medication schedule and plan for any missed doses. No barriers to medication adherence identified.  Medication reconciliation performed and medication/allergy list updated. Current medication list in Epic reviewed, DDIs with Sprycel identified: Category D drug-drug interaction between Sprycel and Magnesium supplement and calcium carbonate supplement - discussed with patient importance of spacing out vitamin supplements from Sprycel (minimum 2 hours before or 2 hours after Sprycel administration), as these specific ones can decrease serum concentration of Sprycel. Patient stated she will take her Sprycel in the evening and vitamins in the AM to avoid this interaction.   All questions answered.  Ms. Cotrell voiced understanding and appreciation.   Medication education handout placed in mail for patient. Patient knows to call the office with questions or concerns. Oral  Chemotherapy Clinic phone number provided to patient.   Sherry Ruffing, PharmD, BCPS, BCOP Hematology/Oncology Clinical Pharmacist [facility] Oral Chemotherapy Navigation Clinics [phone] 10/27/2023 10:13 AM

## 2023-10-27 NOTE — Telephone Encounter (Signed)
 Oral Oncology Patient Advocate Encounter  Prior Authorization for Sprycel has been approved.    PA# [id number] Effective dates: 10/27/2023 through 05/28/2024  Patients co-pay is $250.    Patty Almedia Balls, CPhT Oncology Pharmacy Patient Advocate [facility] Direct Number: [phone] Fax: [phone]

## 2023-10-31 ENCOUNTER — Other Ambulatory Visit: Payer: Self-pay

## 2023-10-31 ENCOUNTER — Ambulatory Visit (INDEPENDENT_AMBULATORY_CARE_PROVIDER_SITE_OTHER): Admitting: Internal Medicine

## 2023-10-31 ENCOUNTER — Encounter: Payer: Self-pay | Admitting: Internal Medicine

## 2023-10-31 VITALS — BP 118/74 | HR 72 | Temp 98.2°F | Resp 18 | Ht 67.0 in | Wt 222.1 lb

## 2023-10-31 DIAGNOSIS — L509 Urticaria, unspecified: Secondary | ICD-10-CM

## 2023-10-31 MED ORDER — EPINEPHRINE 0.3 MG/0.3ML IJ SOAJ: 0.3000 mg | INTRAMUSCULAR | 3 refills | Status: AC | PRN

## 2023-10-31 MED ORDER — PREDNISONE 10 MG PO TABS: ORAL_TABLET | ORAL | 0 refills | Status: DC

## 2023-10-31 NOTE — Patient Instructions (Addendum)
 Take a round of prednisone as prescribed.  Start Allegra 180 mg 1 tablet every day.   If you get lip or tongue swelling: Take Benadryl 50 mg 1 time Use EpiPen Seek medical attention.  We are referring you to an allergist      Please go to the front desk:  Arrange a follow-up in 3 months

## 2023-10-31 NOTE — Progress Notes (Signed)
 Subjective:    Patient ID: [name], female    DOB: [birth year], 53 y.o.   MRN: [id number]  DOS:  10/31/2023 Type of visit - description: Acute  Symptoms started about 2 to 3 weeks ago with hives. They started the lower extremity and has spread up to the neck. They are on and off, extremely pruritic.  She denies fever or chills. No tongue swelling but a couple of times when she is scratched her lips and they got slightly swollen. No shortness of breath or wheezing. No recent tick bite. No new exposure although started atorvastatin about 4 months ago.  She did have sore throat which started around the time of her symptoms, went to urgent care, strep test negative, took amoxicillin.   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)    Past Surgical History:  Procedure Laterality Date   CESAREAN SECTION  2006 & 2012   TUBAL LIGATION      Current Outpatient Medications  Medication Instructions   atorvastatin (LIPITOR) 20 mg, Oral, Daily   calcium carbonate (OS-CAL - DOSED IN MG OF ELEMENTAL CALCIUM) 1250 (500 Ca) MG tablet 1 tablet, Daily   dasatinib (SPRYCEL) 50 mg, Oral, Daily   Ferrous Gluconate-C-Folic Acid (IRON-C PO) Daily   FLUoxetine (PROZAC) 10 MG capsule TAKE 2 CAPSULES(20 MG) BY MOUTH DAILY   fluticasone (FLONASE) 50 MCG/ACT nasal spray 2 sprays, Each Nare, Daily   Magnesium 200 mg, Daily   nystatin-triamcinolone ointment (MYCOLOG) Topical, Daily PRN   Vitamins/Minerals TABS Daily       Objective:   Physical Exam BP 118/74   Pulse 72   Temp 98.2 F (36.8 C) (Oral)   Resp 18   Ht 5\' 7"  (1.702 m)   Wt 222 lb 2 oz (100.8 kg)   LMP  (LMP Unknown)   SpO2 92%   BMI 34.79 kg/m  General:   Well developed, NAD, BMI noted. HEENT:  Normocephalic . Face symmetric,  atraumatic.  Tongue and lips normal.  Throat symmetric, not red, no white patches. Lungs:  CTA B Normal respiratory effort, no intercostal retractions, no accessory muscle use. Heart: RRR,  no murmur.  Lower extremities: no pretibial edema bilaterally  Skin: Today she has hives at the neck.  See picture Neurologic:  alert & oriented X3.  Speech normal, gait appropriate for age and unassisted Psych--  Cognition and judgment appear intact.  Cooperative with normal attention span and concentration.  Behavior appropriate. No anxious or depressed appearing.      Assessment     Assessment  Anxiety depression --- dx 2014 ADD dx 2015 , rx  meds 2015 , self d/c ~ 04-2015  CML ---  DX 2013 + PPD, s/p treatment, had a BCG Glaucoma suspect BTL Menopausal age 48 Chronic dermatitis (perianal, GU area) has tried different medications, currently on hydrocortisone as needed Osteopenia T score -1.1 (11-2020)  PLAN Hives: New issue, started about 2 to 3 weeks ago. At about the same time she had sore throat, went to UC, strep test negative, took amoxicillin temporarily.  Throat exam today negative. Restarted Sprycel (after hives started). Plan: Round of prednisone, Allegra 180 mg daily (unable to take Pepcid due to interaction with the Sprycel), referral to allergist, urgent.  In  case of severe symptoms and swelling of the lips or tongue: EpiPen, Benadryl, seek attention.  See AVS. Just in case, we will hold atorvastatin x 4 weeks, as it is a new medication she started approximately 4 months ago.. I notified  hematology of  new onset of hives.Marland Kitchen RTC 3 months

## 2023-10-31 NOTE — Assessment & Plan Note (Signed)
 Hives: New issue, started about 2 to 3 weeks ago. At about the same time she had sore throat, went to UC, strep test negative, took amoxicillin temporarily.  Throat exam today negative. Restarted Sprycel (after hives started). Plan: Round of prednisone, Allegra 180 mg daily (unable to take Pepcid due to interaction with the Sprycel), referral to allergist, urgent.  In case of severe symptoms and swelling of the lips or tongue: EpiPen, Benadryl, seek attention.  See AVS. Just in case, we will hold atorvastatin x 4 weeks, as it is a new medication she started approximately 4 months ago.. I notified  hematology of  new onset of hives.Marland Kitchen RTC 3 months

## 2023-11-01 ENCOUNTER — Telehealth: Payer: Self-pay | Admitting: Hematology & Oncology

## 2023-11-01 NOTE — Telephone Encounter (Signed)
 Called to schedule follow up visit per inbasket. LVM to return call for scheduling w/ interpreter.

## 2023-11-13 ENCOUNTER — Other Ambulatory Visit: Payer: Self-pay | Admitting: Internal Medicine

## 2023-11-20 ENCOUNTER — Other Ambulatory Visit (HOSPITAL_COMMUNITY): Payer: Self-pay

## 2023-11-22 ENCOUNTER — Inpatient Hospital Stay

## 2023-11-22 ENCOUNTER — Inpatient Hospital Stay: Admitting: Hematology & Oncology

## 2023-11-27 ENCOUNTER — Other Ambulatory Visit: Payer: Self-pay

## 2023-11-27 ENCOUNTER — Encounter: Payer: Self-pay | Admitting: Hematology & Oncology

## 2023-11-27 ENCOUNTER — Inpatient Hospital Stay (HOSPITAL_BASED_OUTPATIENT_CLINIC_OR_DEPARTMENT_OTHER): Admitting: Hematology & Oncology

## 2023-11-27 ENCOUNTER — Inpatient Hospital Stay: Attending: Hematology & Oncology

## 2023-11-27 VITALS — BP 126/58 | HR 69 | Temp 98.8°F | Resp 16 | Ht 67.0 in | Wt 223.0 lb

## 2023-11-27 DIAGNOSIS — Z79899 Other long term (current) drug therapy: Secondary | ICD-10-CM | POA: Diagnosis not present

## 2023-11-27 DIAGNOSIS — C9210 Chronic myeloid leukemia, BCR/ABL-positive, not having achieved remission: Secondary | ICD-10-CM | POA: Diagnosis not present

## 2023-11-27 LAB — CBC WITH DIFFERENTIAL (CANCER CENTER ONLY)
Abs Immature Granulocytes: 0.01 10*3/uL (ref 0.00–0.07)
Basophils Absolute: 0 10*3/uL (ref 0.0–0.1)
Basophils Relative: 1 %
Eosinophils Absolute: 0.1 10*3/uL (ref 0.0–0.5)
Eosinophils Relative: 3 %
HCT: 36.2 % (ref 36.0–46.0)
Hemoglobin: 11.9 g/dL — ABNORMAL LOW (ref 12.0–15.0)
Immature Granulocytes: 0 %
Lymphocytes Relative: 43 %
Lymphs Abs: 1.9 10*3/uL (ref 0.7–4.0)
MCH: 29.6 pg (ref 26.0–34.0)
MCHC: 32.9 g/dL (ref 30.0–36.0)
MCV: 90 fL (ref 80.0–100.0)
Monocytes Absolute: 0.3 10*3/uL (ref 0.1–1.0)
Monocytes Relative: 6 %
Neutro Abs: 2.1 10*3/uL (ref 1.7–7.7)
Neutrophils Relative %: 47 %
Platelet Count: 236 10*3/uL (ref 150–400)
RBC: 4.02 MIL/uL (ref 3.87–5.11)
RDW: 13.7 % (ref 11.5–15.5)
WBC Count: 4.4 10*3/uL (ref 4.0–10.5)
nRBC: 0 % (ref 0.0–0.2)

## 2023-11-27 LAB — CMP (CANCER CENTER ONLY)
ALT: 26 U/L (ref 0–44)
AST: 21 U/L (ref 15–41)
Albumin: 4.3 g/dL (ref 3.5–5.0)
Alkaline Phosphatase: 86 U/L (ref 38–126)
Anion gap: 8 (ref 5–15)
BUN: 17 mg/dL (ref 6–20)
CO2: 29 mmol/L (ref 22–32)
Calcium: 9.4 mg/dL (ref 8.9–10.3)
Chloride: 104 mmol/L (ref 98–111)
Creatinine: 0.92 mg/dL (ref 0.44–1.00)
GFR, Estimated: >60 mL/min (ref >60)
Glucose, Bld: 125 mg/dL — ABNORMAL HIGH (ref 70–99)
Potassium: 4.1 mmol/L (ref 3.5–5.1)
Sodium: 141 mmol/L (ref 135–145)
Total Bilirubin: 0.4 mg/dL (ref 0.0–1.2)
Total Protein: 6.8 g/dL (ref 6.5–8.1)

## 2023-11-27 LAB — SAVE SMEAR(SSMR), FOR PROVIDER SLIDE REVIEW

## 2023-11-27 NOTE — Progress Notes (Signed)
 Hematology and Oncology Follow Up Visit  [name] [id number] 1971/01/27 53 y.o. 11/27/2023   Principle Diagnosis:  Chronic phase CML- major molecular remission   Current Therapy:        Sprycel 50 mg by mouth daily    Interim History:  [name] is here today for follow-up.  Unfortunately, less than that we saw her, her BCR/ABL was measurable.  Essentially went from nondetectable up to 11.6%.  As such, she went out of remission.  We got her back on Sprycel.  She is doing well on the Sprycel.  She has had no problems with nausea or vomiting.  She has had no fluid retention.  She has had no diarrhea.  There has been no cough or shortness of breath.  She comes in with her daughter.  They were going to [place] [facility] [place] for a little bit of a vacation this week.  I know that she will have a wonderful time.  She has had no issues with respect to her left fibula.  She had broken it back in January.  Is doing quite well right now.  She has had no rashes.  There is no bleeding.  She does not have her monthly cycle any longer.  Overall, I would have said that her performance status is ECOG 0.    Medications:  Allergies as of 11/27/2023       Reactions   Chocolate Flavoring Agent (non-screening) Hives        Medication List        Accurate as of November 27, 2023  4:08 PM. If you have any questions, ask your nurse or doctor.          atorvastatin 20 MG tablet Commonly known as: LIPITOR Take 1 tablet (20 mg total) by mouth daily.   calcium carbonate 1250 (500 Ca) MG tablet Commonly known as: OS-CAL - dosed in mg of elemental calcium Take 1 tablet by mouth daily.   EPINEPHrine 0.3 mg/0.3 mL Soaj injection Commonly known as: EpiPen 2-Pak Inject 0.3 mg into the muscle as needed for anaphylaxis.   fexofenadine 180 MG tablet Commonly known as: ALLEGRA Take 180 mg by mouth daily.   FLUoxetine 10 MG capsule Commonly known as: PROZAC Take 2 capsules (20 mg total) by mouth daily.    fluticasone 50 MCG/ACT nasal spray Commonly known as: FLONASE Place 2 sprays into both nostrils daily.   IRON-C PO Take by mouth daily.   Magnesium 100 MG Caps 200 mg daily.   nystatin-triamcinolone ointment Commonly known as: MYCOLOG Apply topically daily as needed.   predniSONE 10 MG tablet Commonly known as: DELTASONE 4 tablets x 2 days, 3 tabs x 2 days, 2 tabs x 2 days, 1 tab x 2 days   Sprycel 50 MG tablet Generic drug: dasatinib Tome 1 tableta (50 mg en total) por va oral diariamente. (Take 1 tablet (50 mg total) by mouth daily.)   Vitamins/Minerals Tabs Take by mouth daily.        Allergies:  Allergies  Allergen Reactions   Chocolate Flavoring Agent (Non-Screening) Hives    Past Medical History, Surgical history, Social history, and Family History were reviewed and updated.  Review of Systems: Review of Systems  Constitutional: Negative.   HENT: Negative.    Eyes:  Positive for discharge and redness.  Respiratory: Negative.    Cardiovascular: Negative.   Gastrointestinal: Negative.   Genitourinary: Negative.   Musculoskeletal: Negative.   Skin:  Positive for itching and rash.  Neurological: Negative.   Endo/Heme/Allergies: Negative.   Psychiatric/Behavioral: Negative.       Physical Exam:  height is 5\' 7"  (1.702 m) and weight is 223 lb (101.2 kg). Her oral temperature is 98.8 F (37.1 C). Her blood pressure is 126/58 (abnormal) and her pulse is 69. Her respiration is 16 and oxygen saturation is 97%.   Wt Readings from Last 3 Encounters:  11/27/23 223 lb (101.2 kg)  10/31/23 222 lb 2 oz (100.8 kg)  10/18/23 221 lb (100.2 kg)   Her vital signs are temperature of 98.8.  Pulse 69.  Blood pressure 126/58.  Weight is 223 pounds.   Physical Exam Vitals reviewed.  HENT:     Head: Normocephalic and atraumatic.     Comments: Head and neck exam shows erythema and swelling under the left eye.  Maybe a little bit of erythema with the conjunctive of  the left eye.  Her pupil reacts appropriately.  There is no adenopathy in the neck. Eyes:     Pupils: Pupils are equal, round, and reactive to light.  Cardiovascular:     Rate and Rhythm: Normal rate and regular rhythm.     Heart sounds: Normal heart sounds.  Pulmonary:     Effort: Pulmonary effort is normal.     Breath sounds: Normal breath sounds.  Abdominal:     General: Bowel sounds are normal.     Palpations: Abdomen is soft.  Musculoskeletal:        General: No tenderness or deformity. Normal range of motion.     Cervical back: Normal range of motion.  Lymphadenopathy:     Cervical: No cervical adenopathy.  Skin:    General: Skin is warm and dry.     Findings: No erythema or rash.     Comments: She has some scattered papular type lesions on her arms secondary to the poison ivy.  Neurological:     Mental Status: She is alert and oriented to person, place, and time.  Psychiatric:        Behavior: Behavior normal.        Thought Content: Thought content normal.        Judgment: Judgment normal.     Lab Results  Component Value Date   WBC 4.4 11/27/2023   HGB 11.9 (L) 11/27/2023   HCT 36.2 11/27/2023   MCV 90.0 11/27/2023   PLT 236 11/27/2023   Lab Results  Component Value Date   FERRITIN 170 05/26/2022   IRON 41 05/26/2022   TIBC 323 05/26/2022   UIBC 282 05/26/2022   IRONPCTSAT 13 05/26/2022   Lab Results  Component Value Date   RBC 4.02 11/27/2023   No results found for: "KPAFRELGTCHN", "LAMBDASER", "KAPLAMBRATIO" No results found for: "IGGSERUM", "IGA", "IGMSERUM" No results found for: "TOTALPROTELP", "ALBUMINELP", "A1GS", "A2GS", "BETS", "BETA2SER", "GAMS", "MSPIKE", "SPEI"   Chemistry      Component Value Date/Time   NA 141 11/27/2023 1500   NA 143 08/10/2017 1518   K 4.1 11/27/2023 1500   K 4.2 08/10/2017 1518   CL 104 11/27/2023 1500   CL 106 08/10/2017 1518   CO2 29 11/27/2023 1500   CO2 31 08/10/2017 1518   BUN 17 11/27/2023 1500   BUN 21  08/10/2017 1518   CREATININE 0.92 11/27/2023 1500   CREATININE 0.8 08/10/2017 1518      Component Value Date/Time   CALCIUM 9.4 11/27/2023 1500   CALCIUM 9.2 08/10/2017 1518   ALKPHOS 86 11/27/2023 1500   ALKPHOS  65 08/10/2017 1518   AST 21 11/27/2023 1500   ALT 26 11/27/2023 1500   ALT 24 08/10/2017 1518   BILITOT 0.4 11/27/2023 1500       Impression and Plan: Ms. Palma is a very pleasant 53 yo Hispanic female with chronic phase CML in MMR.  She did have a relapse back in 2017.  We had her on Sprycel for the relapse.  He subsequently had discontinued this in October.  Unfortunately, she relapsed.  We now have her back on Sprycel.  I am sure that it was going to work.  We will see what the BCR/ABL is.  We will try to get her back now in about 3 months.  At that time, she should be done with school for the year.  It is incredibly nice meeting her daughter.  Her daughter clearly is incredibly intelligent and a lot of fun to talk to.   Ivor Mars, MD 4/14/20254:08 PM

## 2023-11-27 NOTE — Progress Notes (Unsigned)
 New Patient Note  RE: [name] MRN: [id number] DOB: [birth year] Date of Office Visit: 11/28/2023  Consult requested by: Wanda Plump, MD Primary care provider: Wanda Plump, MD  Chief Complaint: Urticaria (Takes allegra daily to control hive flares - hives started end of March (stomach, legs, back, hands)  - not sure the cause )  History of Present Illness: I had the pleasure of seeing [name] for initial evaluation at the [facility] on 11/28/2023. She is a 53 y.o. female, who is referred here by Wanda Plump, MD for the evaluation of hives.  Discussed the use of AI scribe software for clinical note transcription with the patient, who gave verbal consent to proceed.    [name] is a 53 year old female with chronic myeloid leukemia who presents with a persistent rash. She was referred by her primary care doctor, Dr. Drue Novel, for evaluation of a persistent rash.  She has been experiencing a rash since March, affecting her stomach, legs, arms, and back. The rash is itchy, intensifies in the afternoon, and lasts several hours, sometimes overnight. Scratching exacerbates the rash, causing it to spread, but it does not bruise unless scratched. Allegra, taken once daily, alleviates the rash, which reappears without it.  Her medical history includes chronic myeloid leukemia diagnosed in 2013, treated with Sprycel. She discontinued Sprycel on June 02, 2023, but resumed it after a follow-up showed she was no longer in remission. The rash began when she was not on Sprycel.  She had a sore throat treated with amoxicillin and prednisone, resolving the infection. The rash started approximately three weeks before November 13, 2023, and persisted through her illness.  Her childhood allergies included chocolate, oranges, and strawberries, causing eczema-like rashes, though she no longer experiences these reactions. Chocolate can still cause pimples. She denies new medications  or environmental changes triggering the rash.  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 She lives in a house with hardwood floors, some carpet, central air, and owns a dog and two cats. She denies smoking and reports no mold or water damage in her home.  Her current medications include Prozac, Sprycel, and calcium. She carries an Epipen due to previous concerns with lip tingling at times with the rash.      Frequency of episodes: daily. Suspected triggers are unknown.  Patient started atorvastatin 4 months prior but cessation didn't help. She had a sore throat and was started on amoxicillin but previously tolerated it with no issues.   She has tried the following therapies: allegra 180mg  once a day with good benefit. Previous work up includes: 2025 labs cbc diff and cmp. Previous history of rash/hives: no. Patient is up to date with the following cancer screening tests: patient has CML since 2013 and follows with heme/onc.  10/03/2023 PCP visit: "Hives: New issue, started about 2 to 3 weeks ago. At about the same time she had sore throat, went to UC, strep test negative, took amoxicillin temporarily.  Throat exam today negative. Restarted Sprycel (after hives started). Plan: Round of prednisone, Allegra 180 mg daily (unable to take Pepcid due to interaction with the Sprycel), referral to allergist, urgent.  In case of severe symptoms and swelling of the lips or tongue: EpiPen, Benadryl, seek attention.  See AVS. Just in case, we will hold atorvastatin x 4 weeks, as it is a new medication she started approximately 4  months ago.. I notified  hematology of  new onset of hives.Marland Kitchen RTC 3 months"  11/27/2023 heme/onc visit: "Impression and Plan: [name] is a very pleasant 53 yo Hispanic female with chronic phase CML in MMR.  She did have a relapse back in 2017.  We had her on Sprycel for the relapse.  He  subsequently had discontinued this in October.  Unfortunately, she relapsed.   We now have her back on Sprycel.  I am sure that it was going to work.   We will see what the BCR/ABL is."  Assessment and Plan: [name] is a 53 y.o. female with: Urticaria Pruritic hives on multiple body areas for over a month. Allegra effective when taken daily. No clear trigger identified. Medical history significant for CML. Etiology unclear - unlikely to be related to food/environmental allergens. Possible immune stress from CML. Keep track of rashes and take pictures. Write down what you had done during flares.  See below for proper skin care. Use fragrance free and dye free products. No dryer sheets or fabric softener.   Start allegra (fexofenadine) 180mg  once a day and may take twice a day if needed.  If symptoms are not controlled or causes drowsiness let us know. Avoid the following potential triggers: alcohol, tight clothing, NSAIDs, hot showers and getting overheated. See below for proper skin care.  You may resume atorvastatin - I don't think this caused the hives.  Get bloodwork to rule out systemic issues.  Other allergic rhinitis Possibly some spring time allergies. I don't think this caused the hives but will check via bloodwork. The allegra should also help with these symptoms.   Return in about 4 weeks (around 12/26/2023).  No orders of the defined types were placed in this encounter.  Lab Orders         Allergens w/Total IgE Area 2         ANA, IFA (with reflex)         Chronic Urticaria         C-reactive protein         Tryptase         Thyroid Cascade Profile         Sedimentation rate         Alpha-Gal Panel      Other allergy screening: Asthma: no Rhino conjunctivitis:  slightly in the spring Food allergy: yes Chocolate - usually causes rashes. Medication allergy: no Hymenoptera allergy: no Eczema:yes History of recurrent infections suggestive of immunodeficency:  no  Diagnostics: None.   Past Medical History: Patient Active Problem List   Diagnosis Date Noted   Closed torus fracture of distal end of left fibula 07/18/2023   Dyslipidemia, mild, soft plaque per CTA 03/29/2023   Vitamin D deficiency 03/29/2023   Closed fracture of distal end of right fibula with routine healing 11/04/2022   IDA (iron deficiency anemia) 12/08/2020   Left ankle pain 02/08/2016   PCP NOTES >>>>>>>>>>>>>>>>>>>>>>>>>>>>>>>>>>>>> 10/11/2015   Anxiety and depression 04/02/2013   CML (chronic myelocytic leukemia) (HCC) 07/11/2012   Annual physical exam 05/11/2012   ADD 05/11/2012   Past Medical History:  Diagnosis Date   Angio-edema    Anxiety and depression 04/02/2013   CML (chronic myelocytic leukemia) (HCC) 08/16/2011   Glaucoma suspect    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     dasatinib (SPRYCEL) 50 MG tablet Take 1 tablet (50 mg total) by mouth daily. 30 tablet 2   EPINEPHrine (EPIPEN 2-PAK) 0.3 mg/0.3 mL IJ SOAJ injection Inject 0.3 mg into the muscle as needed for anaphylaxis. 2 each 3   Ferrous Gluconate-C-Folic Acid (IRON-C PO) Take by mouth daily.     fexofenadine (ALLEGRA) 180 MG tablet Take 180 mg by mouth daily.     FLUoxetine (PROZAC) 10 MG capsule Take 2 capsules (20 mg total) by mouth daily. 180 capsule 1   fluticasone (FLONASE) 50 MCG/ACT nasal spray Place 2 sprays into both nostrils daily. 16 g 6   Magnesium 100 MG CAPS 200 mg daily.     nystatin-triamcinolone ointment (MYCOLOG) Apply topically daily as needed. 30 g 1   Vitamins/Minerals TABS Take by mouth daily.      No current  facility-administered medications for this visit.   Allergies: Allergies  Allergen Reactions   Chocolate Flavoring Agent (Non-Screening) Hives   Social History: Social History   Socioeconomic History   Marital status: Divorced    Spouse name: Financial planner   Number of children: 2   Years of education: Not on file   Highest education level: Master's degree (e.g., MA, MS, MEng, MEd, MSW, MBA)  Occupational History   Occupation: TEACHER    Employer: [employer]  Tobacco Use   Smoking status: Never    Passive exposure: Past   Smokeless tobacco: Never   Tobacco comments:    never used tobacco  Vaping Use   Vaping status: Never Used  Substance and Sexual Activity   Alcohol use: No    Alcohol/week: 0.0 standard drinks of alcohol   Drug use: No   Sexual activity: Yes    Partners: Male  Other Topics Concern   Not on file  Social History Narrative   From [place]    Separated from Husband 2022   2 children :   female  2006, female  2012      Social Drivers of Corporate investment banker Strain: Low Risk  (11/03/2022)   Overall Financial Resource Strain (CARDIA)    Difficulty of Paying Living Expenses: Not very hard  Food Insecurity: No Food Insecurity (11/03/2022)   Hunger Vital Sign    Worried About Running Out of Food in the Last Year: Never true    Ran Out of Food in the Last Year: Never true  Transportation Needs: No Transportation Needs (11/03/2022)   PRAPARE - Administrator, Civil Service (Medical): No    Lack of Transportation (Non-Medical): No  Physical Activity: Sufficiently Active (11/03/2022)   Exercise Vital Sign    Days of Exercise per Week: 5 days    Minutes of Exercise per Session: 30 min  Stress: Stress Concern Present (11/03/2022)   [employer] of Occupational Health - Occupational Stress Questionnaire    Feeling of Stress : Very much  Social Connections: Socially Isolated (11/03/2022)   Social Connection and Isolation Panel [NHANES]     Frequency of Communication with Friends and Family: More than three times a week    Frequency of Social Gatherings with Friends and Family: Never    Attends Religious Services: Never  Active Member of Clubs or Organizations: No    Attends Banker Meetings: Not on file    Marital Status: Separated   Lives in a house. Smoking: denies Occupation: Runner, broadcasting/film/video - middle and high school  Environmental History: Water Damage/mildew in the house: no Engineer, civil (consulting) in the family room: no Carpet in the bedroom: yes Heating: gas Cooling: central Pet: yes 1 dog x 8 yrs, 2 cats x 2 yrs  Family History: Family History  Problem Relation Age of Onset   Rosacea Mother    Hypertension Maternal Grandmother    Cancer Maternal Grandfather    Heart disease Paternal Grandmother    Stroke Paternal Grandfather    Diabetes Neg Hx    Colon cancer Neg Hx    Breast cancer Neg Hx    Problem                               Relation Asthma                                   children  Review of Systems  Constitutional:  Negative for appetite change, chills, fever and unexpected weight change.  HENT:  Negative for congestion and rhinorrhea.   Eyes:  Negative for itching.  Respiratory:  Negative for cough, chest tightness, shortness of breath and wheezing.   Cardiovascular:  Negative for chest pain.  Gastrointestinal:  Negative for abdominal pain.  Genitourinary:  Negative for difficulty urinating.  Skin:  Positive for rash.  Neurological:  Negative for headaches.    Objective: BP 118/70 (BP Location: Right Arm, Patient Position: Sitting, Cuff Size: Large)   Pulse 73   Temp 98.5 F (36.9 C) (Temporal)   Resp 18   Ht 5\' 5"  (1.651 m)   Wt 224 lb 6.4 oz (101.8 kg)   LMP  (LMP Unknown)   SpO2 96%   BMI 37.34 kg/m  Body mass index is 37.34 kg/m. Physical Exam Vitals and nursing note reviewed.  Constitutional:      Appearance: Normal appearance. She is well-developed.  HENT:     Head:  Normocephalic and atraumatic.     Right Ear: Tympanic membrane and external ear normal.     Left Ear: Tympanic membrane and external ear normal.     Nose: Nose normal.     Mouth/Throat:     Mouth: Mucous membranes are moist.     Pharynx: Oropharynx is clear.  Eyes:     Conjunctiva/sclera: Conjunctivae normal.  Cardiovascular:     Rate and Rhythm: Normal rate and regular rhythm.     Heart sounds: Normal heart sounds. No murmur heard.    No friction rub. No gallop.  Pulmonary:     Effort: Pulmonary effort is normal.     Breath sounds: Normal breath sounds. No wheezing, rhonchi or rales.  Musculoskeletal:     Cervical back: Neck supple.  Skin:    General: Skin is warm.     Findings: Rash present.     Comments: One hive on each forearm.  Neurological:     Mental Status: She is alert and oriented to person, place, and time.  Psychiatric:        Behavior: Behavior normal.    The plan was reviewed with the patient/family, and all questions/concerned were addressed.  It was my pleasure to see [name] today and participate in  her care. Please feel free to contact me with any questions or concerns.  Sincerely,  Eudelia Hero, DO Allergy & Immunology  [facility]  [facility] office: [phone] [facility] office: [phone]

## 2023-11-28 ENCOUNTER — Ambulatory Visit: Payer: Self-pay | Admitting: Allergy

## 2023-11-28 ENCOUNTER — Encounter: Payer: Self-pay | Admitting: Family

## 2023-11-28 ENCOUNTER — Encounter: Payer: Self-pay | Admitting: Allergy

## 2023-11-28 VITALS — BP 118/70 | HR 73 | Temp 98.5°F | Resp 18 | Ht 65.0 in | Wt 224.4 lb

## 2023-11-28 DIAGNOSIS — J3089 Other allergic rhinitis: Secondary | ICD-10-CM | POA: Diagnosis not present

## 2023-11-28 DIAGNOSIS — L509 Urticaria, unspecified: Secondary | ICD-10-CM | POA: Diagnosis not present

## 2023-11-28 NOTE — Patient Instructions (Addendum)
 Hives I'm not sure what caused this. Unlikely to be related to anything you are eating or environmental. Keep track of rashes and take pictures. Write down what you had done during flares.  See below for proper skin care. Use fragrance free and dye free products. No dryer sheets or fabric softener.    Start allegra (fexofenadine) 180mg  once a day and may take twice a day if needed.  If symptoms are not controlled or causes drowsiness let us  know. Avoid the following potential triggers: alcohol, tight clothing, NSAIDs, hot showers and getting overheated. See below for proper skin care.  You may resume atorvastatin - I don't think this caused the hives.   Get bloodwork. We are ordering labs, so please allow 1-2 weeks for the results to come back. With the newly implemented Cures Act, the labs might be visible to you at the same time that they become visible to me. However, I will not address the results until all of the results are back, so please be patient.   Environmental allergies Possibly some spring time allergies. I don't think this caused the hives but will check via bloodwork. The allegra should also help with these symptoms.   Follow up in 4 weeks or sooner if needed.  Skin care recommendations  Bath time: Always use lukewarm water. AVOID very hot or cold water. Keep bathing time to 5-10 minutes. Do NOT use bubble bath. Use a mild soap and use just enough to wash the dirty areas. Do NOT scrub skin vigorously.  After bathing, pat dry your skin with a towel. Do NOT rub or scrub the skin.  Moisturizers and prescriptions:  ALWAYS apply moisturizers immediately after bathing (within 3 minutes). This helps to lock-in moisture. Use the moisturizer several times a day over the whole body. Good summer moisturizers include: Aveeno, CeraVe, Cetaphil. Good winter moisturizers include: Aquaphor, Vaseline, Cerave, Cetaphil, Eucerin, Vanicream. When using moisturizers along with  medications, the moisturizer should be applied about one hour after applying the medication to prevent diluting effect of the medication or moisturize around where you applied the medications. When not using medications, the moisturizer can be continued twice daily as maintenance.  Laundry and clothing: Avoid laundry products with added color or perfumes. Use unscented hypo-allergenic laundry products such as Tide free, Cheer free & gentle, and All free and clear.  If the skin still seems dry or sensitive, you can try double-rinsing the clothes. Avoid tight or scratchy clothing such as wool. Do not use fabric softeners or dyer sheets.

## 2023-11-30 LAB — BCR-ABL1, CML/ALL, PCR, QUANT
E1A2 Transcript: <0.0032 %
b2a2 transcript: 1.0512 %
b3a2 transcript: <0.0032 %

## 2023-12-01 ENCOUNTER — Encounter: Payer: Self-pay | Admitting: *Deleted

## 2023-12-08 LAB — ALPHA-GAL PANEL
Allergen Lamb IgE: <0.1 kU/L
Beef IgE: <0.1 kU/L
IgE (Immunoglobulin E), Serum: 72 [IU]/mL (ref 6–495)
O215-IgE Alpha-Gal: <0.1 kU/L
Pork IgE: <0.1 kU/L

## 2023-12-08 LAB — ALLERGENS W/TOTAL IGE AREA 2

## 2023-12-08 LAB — SEDIMENTATION RATE: Sed Rate: 20 mm/h (ref 0–40)

## 2023-12-08 LAB — CHRONIC URTICARIA: cu index: 9.6 (ref <10)

## 2023-12-08 LAB — C-REACTIVE PROTEIN: CRP: 3 mg/L (ref 0–10)

## 2023-12-08 LAB — TRYPTASE: Tryptase: 3.4 ug/L (ref 2.2–13.2)

## 2023-12-08 LAB — THYROID CASCADE PROFILE: TSH: 3.48 u[IU]/mL (ref 0.450–4.500)

## 2023-12-08 LAB — ANTINUCLEAR ANTIBODIES, IFA: ANA Titer 1: NEGATIVE

## 2023-12-11 ENCOUNTER — Encounter: Payer: Self-pay | Admitting: Allergy

## 2023-12-14 ENCOUNTER — Encounter: Payer: Self-pay | Admitting: Family

## 2023-12-21 ENCOUNTER — Ambulatory Visit: Admitting: Internal Medicine

## 2023-12-25 ENCOUNTER — Ambulatory Visit (INDEPENDENT_AMBULATORY_CARE_PROVIDER_SITE_OTHER): Admitting: Internal Medicine

## 2023-12-25 ENCOUNTER — Encounter: Payer: Self-pay | Admitting: Internal Medicine

## 2023-12-25 VITALS — BP 108/66 | HR 64 | Temp 98.0°F | Resp 16 | Ht 67.0 in | Wt 221.1 lb

## 2023-12-25 DIAGNOSIS — L509 Urticaria, unspecified: Secondary | ICD-10-CM | POA: Diagnosis not present

## 2023-12-25 DIAGNOSIS — R1013 Epigastric pain: Secondary | ICD-10-CM | POA: Diagnosis not present

## 2023-12-25 MED ORDER — ATORVASTATIN CALCIUM 20 MG PO TABS: 20.0000 mg | ORAL_TABLET | Freq: Every day | ORAL | Status: DC

## 2023-12-25 NOTE — Progress Notes (Unsigned)
   Subjective:    Patient ID: [name], female    DOB: Jan 06, 1971, 53 y.o.   MRN: [id number]  DOS:  12/25/2023 Type of visit - description: Acute  Yesterday had a episode where the upper stomach felt bloated and hard.  Mostly at the epigastric left upper abdomen.  Some radiation to the back She also has some nausea and vomited once.  No hematemesis. Symptoms have significantly improved, minimal discomfort today described as "mild burning sensation" at the epigastrium.  No fever or chills Stools normal in color, no blood in the stools, no constipation.  Occasionally diarrhea but that has not been the case in the last 48 hours. Not taking NSAIDs. 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   Glaucoma suspect    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)   Urticaria     Past Surgical History:  Procedure Laterality Date   CESAREAN SECTION  2006 & 2012   TUBAL LIGATION      Current Outpatient Medications  Medication Instructions   calcium  carbonate (OS-CAL - DOSED IN MG OF ELEMENTAL CALCIUM ) 1250 (500 Ca) MG tablet 1 tablet, Daily   EPINEPHrine  (EPIPEN  2-PAK) 0.3 mg, Intramuscular, As needed   Ferrous Gluconate-C-Folic Acid (IRON-C PO) Daily   fexofenadine  (ALLEGRA ) 180 mg, Daily   FLUoxetine  (PROZAC ) 20 mg, Oral, Daily   fluticasone  (FLONASE ) 50 MCG/ACT nasal spray 2 sprays, Each Nare, Daily   Magnesium  200 mg, Daily   nystatin -triamcinolone  ointment (MYCOLOG) Topical, Daily PRN   Sprycel  50 mg, Oral, Daily   Vitamins/Minerals TABS Daily       Objective:   Physical Exam BP 108/66   Pulse 64   Temp 98 F (36.7 C) (Oral)   Resp 16   Ht 5\' 7"  (1.702 m)   Wt 221 lb 2 oz (100.3 kg)   LMP  (LMP Unknown)   SpO2 97%   BMI 34.63 kg/m  General:   Well developed, NAD, BMI noted.  HEENT:   Normocephalic . Face symmetric, atraumatic Abdomen:  Not distended, soft, non-tender. No rebound or rigidity.  Palpable aorta, nontender.  (No new finding) Skin: Not pale. Not jaundice Lower extremities: no pretibial edema bilaterally  Neurologic:  alert & oriented X3.  Speech normal, gait appropriate for age and unassisted Psych--  Cognition and judgment appear intact.  Cooperative with normal attention span and concentration.  Behavior appropriate. No anxious or depressed appearing.     Assessment   Assessment  Anxiety depression --- dx 2014 ADD dx 2015 , rx  meds 2015 , self d/c ~ 04-2015  CML ---  DX 2013 + PPD, s/p treatment, had a BCG Glaucoma suspect BTL Menopausal age 65 Chronic dermatitis (perianal, GU area) has tried different medications, currently on hydrocortisone  as needed Osteopenia T score -1.1 (11-2020) Palpable Ao : [place]  2015-no AAA  PLAN Upper abdominal pain: Transient episode as described above, much improved today spontaneously, denies taking NSAID, no red flags.  Abdominal exam is benign. We agreed on omeprazole 20 mg daily for the next 2 weeks. If symptoms resurface she will let me know.  Will need further evaluation. Urticaria, seen by the allergist last month, blood work negative, recommended Allegra  daily.

## 2023-12-25 NOTE — Patient Instructions (Signed)
 Take omeprazole 20 mg OTC: 1 tablet daily before breakfast for the next 2 weeks.  Call if symptoms resurface.  If severe symptoms you need to be seen

## 2023-12-26 NOTE — Assessment & Plan Note (Signed)
 Upper abdominal pain: Transient episode as described above, much improved today spontaneously, denies taking NSAID, no red flags.  Abdominal exam is benign. We agreed on omeprazole 20 mg daily for the next 2 weeks. If symptoms resurface she will let me know.  Will need further evaluation. Urticaria, seen by the allergist last month, blood work negative, recommended Allegra  daily.

## 2023-12-29 ENCOUNTER — Encounter: Payer: Self-pay | Admitting: Internal Medicine

## 2024-01-05 ENCOUNTER — Other Ambulatory Visit (HOSPITAL_COMMUNITY): Payer: Self-pay

## 2024-01-15 ENCOUNTER — Other Ambulatory Visit: Payer: Self-pay

## 2024-01-15 NOTE — Progress Notes (Signed)
 Specialty Pharmacy Refill Coordination Note  [name] is a 53 y.o. female contacted today regarding refills of specialty medication(s) Dasatinib  (SPRYCEL )   Patient requested [name] at [facility] at [place] date: 01/16/24   Medication will be filled on 01/16/24.   Patient is aware that insurance requires generic sprycel  and is now a $100 copay. Patient is aware to contact the pharmacy on 01/16/24 if copay the patient advocate was able to apply for assistance.

## 2024-01-15 NOTE — Progress Notes (Signed)
 Specialty Pharmacy Ongoing Clinical Assessment Note  [name] is a 53 y.o. female who is being followed by the specialty pharmacy service for RxSp Oncology   Patient's specialty medication(s) reviewed today: Dasatinib  (SPRYCEL )   Missed doses in the last 4 weeks: 0   Patient/Caregiver did not have any additional questions or concerns.   Therapeutic benefit summary: Unable to assess   Adverse events/side effects summary: No adverse events/side effects   Patient's therapy is appropriate to: Continue    Goals Addressed             This Visit's Progress    Slow Disease Progression       Patient is unable to be assessed as therapy was recently initiated. Patient will maintain adherence          Follow up: 3 months  [name] Specialty Pharmacist

## 2024-01-16 ENCOUNTER — Encounter: Payer: Self-pay | Admitting: Family

## 2024-01-16 ENCOUNTER — Telehealth: Payer: Self-pay | Admitting: Pharmacy Technician

## 2024-01-16 ENCOUNTER — Other Ambulatory Visit (HOSPITAL_COMMUNITY): Payer: Self-pay

## 2024-01-16 ENCOUNTER — Other Ambulatory Visit: Payer: Self-pay

## 2024-01-16 NOTE — Telephone Encounter (Addendum)
 Oral Oncology Patient Advocate Encounter   Was successful in obtaining a copay card for Dasatinib .  This copay card will make the patients copay $0.  The copay card has an annual maximum benefit of $1,200   The billing information is as follows and has been shared with [facility].   RxBin: [id number] PCN: [id number] Member ID: [id number] Group ID: [id number]   Jullianna Gabor (Patty) Benjaman Branch, CPhT  [facility], [place], [place], [place] Oral Chemotherapy Patient Advocate Phone: [phone]  Fax: [phone]

## 2024-01-20 ENCOUNTER — Other Ambulatory Visit (HOSPITAL_COMMUNITY): Payer: Self-pay

## 2024-01-26 ENCOUNTER — Ambulatory Visit (INDEPENDENT_AMBULATORY_CARE_PROVIDER_SITE_OTHER): Admitting: Internal Medicine

## 2024-01-26 VITALS — BP 138/82 | HR 68 | Temp 98.0°F | Resp 16 | Ht 67.0 in | Wt 221.1 lb

## 2024-01-26 DIAGNOSIS — M19042 Primary osteoarthritis, left hand: Secondary | ICD-10-CM | POA: Diagnosis not present

## 2024-01-26 DIAGNOSIS — F32A Depression, unspecified: Secondary | ICD-10-CM

## 2024-01-26 DIAGNOSIS — L509 Urticaria, unspecified: Secondary | ICD-10-CM

## 2024-01-26 DIAGNOSIS — M65311 Trigger thumb, right thumb: Secondary | ICD-10-CM

## 2024-01-26 DIAGNOSIS — Z1211 Encounter for screening for malignant neoplasm of colon: Secondary | ICD-10-CM

## 2024-01-26 DIAGNOSIS — F419 Anxiety disorder, unspecified: Secondary | ICD-10-CM

## 2024-01-26 NOTE — Progress Notes (Unsigned)
   Subjective:    Patient ID: [name], female    DOB: 09-Jun-1971, 53 y.o.   MRN: [id number]  DOS:  01/26/2024 Type of visit - description: Follow-up  See LOV, had abdominal pain, eventually resolved.  Denies nausea vomiting or blood in the stools. Hives are well-controlled, occasionally has some pruritus at night. Denies any tongue or lip swelling. 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   Glaucoma suspect    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)   Urticaria     Past Surgical History:  Procedure Laterality Date   CESAREAN SECTION  2006 & 2012   TUBAL LIGATION      Current Outpatient Medications  Medication Instructions   atorvastatin  (LIPITOR) 20 mg, Oral, Daily   calcium  carbonate (OS-CAL - DOSED IN MG OF ELEMENTAL CALCIUM ) 1250 (500 Ca) MG tablet 1 tablet, Daily   dasatinib  (SPRYCEL ) 50 mg, Oral, Daily   EPINEPHrine  (EPIPEN  2-PAK) 0.3 mg, Intramuscular, As needed   Ferrous Gluconate-C-Folic Acid (IRON-C PO) Daily   fexofenadine  (ALLEGRA ) 180 mg, Daily   FLUoxetine  (PROZAC ) 20 mg, Oral, Daily   fluticasone  (FLONASE ) 50 MCG/ACT nasal spray 2 sprays, Each Nare, Daily   Magnesium  200 mg, Daily   nystatin -triamcinolone  ointment (MYCOLOG) Topical, Daily PRN   Vitamins/Minerals TABS Daily       Objective:   Physical Exam BP 138/82   Pulse 68   Temp 98 F (36.7 C) (Oral)   Resp 16   Ht 5' 7 (1.702 m)   Wt 221 lb 2 oz (100.3 kg)   LMP  (LMP Unknown)   SpO2 97%   BMI 34.63 kg/m  General:   Well developed, NAD, BMI noted. HEENT:  Normocephalic . Face symmetric, atraumatic MSK: Trigger finger phenomenon noted on the right thumb Left thumb: No swelling, erythema.  Slightly TTP at the base of the thumb Neurologic:  alert &  oriented X3.  Speech normal, gait appropriate for age and unassisted Psych--  Cognition and judgment appear intact.  Cooperative with normal attention span and concentration.  Behavior appropriate. No anxious or depressed appearing.      Assessment    Problem list: Anxiety depression --- dx 2014 ADD dx 2015 , rx  meds 2015 , self d/c ~ 04-2015  CML ---  DX 2013 + PPD, s/p treatment, had a BCG Glaucoma suspect BTL Menopausal age 71 Chronic dermatitis (perianal, GU area) has tried different medications, currently on hydrocortisone  as needed Osteopenia T score -1.1 (11-2020) Palpable Ao : [place]  2015-no AAA  PLAN Upper abdominal pain: See LOV, symptoms resolved, off PPIs. Hives: On Allegra  daily, from time to time has some pruritus at night and she takes an extra Allegra  with good control of the problem.  Denies severe symptoms.  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  I offer observation and Voltaren gel as needed but she would like this to be seen by orthopedics.  Referral placed. Preventative care CCS discussed, due for a Cologuard but she is electing to get colonoscopy.  Referral sent. RTC CPX 3 to 4 months

## 2024-01-26 NOTE — Patient Instructions (Signed)
 Please call the gastroenterology office and arrange for your colonoscopy [phone]  We are referring you to orthopedic doctors regarding your hands     Next office visit for a physical exam in 3 to 4 months Please make an appointment before you leave today

## 2024-01-27 NOTE — Assessment & Plan Note (Signed)
 Upper abdominal pain: See LOV, symptoms resolved, off PPIs. Hives: On Allegra  daily, from time to time has some pruritus at night and she takes an extra Allegra  with good control of the problem.  Denies severe symptoms.  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  I offer observation and Voltaren gel as needed but she would like this to be seen by orthopedics.  Referral placed. Preventative care CCS discussed, due for a Cologuard but she is electing to get colonoscopy.  Referral sent. RTC CPX 3 to 4 months

## 2024-02-02 ENCOUNTER — Encounter (INDEPENDENT_AMBULATORY_CARE_PROVIDER_SITE_OTHER): Payer: Self-pay

## 2024-02-02 ENCOUNTER — Other Ambulatory Visit: Payer: Self-pay

## 2024-02-05 ENCOUNTER — Other Ambulatory Visit: Payer: Self-pay

## 2024-02-05 NOTE — Progress Notes (Signed)
 Specialty Pharmacy Refill Coordination Note  [name] is a 53 y.o. female contacted today regarding refills of specialty medication(s) Dasatinib  (SPRYCEL )   Patient requested Delivery   Delivery date: 02/13/24   Verified address: [address] [place], [address]   Medication will be filled on 02/12/2024.

## 2024-02-05 NOTE — Progress Notes (Signed)
 Clinical Intervention Note  Clinical Intervention Notes: Patient reports initiating Allegra . No DDIs identified with Sprycel .   Clinical Intervention Outcomes: Prevention of an adverse drug event   Delon CHRISTELLA Brow Specialty Pharmacist

## 2024-02-12 ENCOUNTER — Other Ambulatory Visit: Payer: Self-pay

## 2024-02-15 ENCOUNTER — Encounter: Payer: Self-pay | Admitting: Hematology & Oncology

## 2024-02-15 ENCOUNTER — Inpatient Hospital Stay: Attending: Hematology & Oncology

## 2024-02-15 ENCOUNTER — Inpatient Hospital Stay: Admitting: Hematology & Oncology

## 2024-02-15 VITALS — BP 126/73 | HR 58 | Temp 98.3°F | Resp 20 | Ht 67.0 in | Wt 220.0 lb

## 2024-02-15 DIAGNOSIS — C9210 Chronic myeloid leukemia, BCR/ABL-positive, not having achieved remission: Secondary | ICD-10-CM

## 2024-02-15 LAB — CMP (CANCER CENTER ONLY)
ALT: 20 U/L (ref 0–44)
AST: 20 U/L (ref 15–41)
Albumin: 4.5 g/dL (ref 3.5–5.0)
Alkaline Phosphatase: 73 U/L (ref 38–126)
Anion gap: 8 (ref 5–15)
BUN: 15 mg/dL (ref 6–20)
CO2: 28 mmol/L (ref 22–32)
Calcium: 9.5 mg/dL (ref 8.9–10.3)
Chloride: 103 mmol/L (ref 98–111)
Creatinine: 0.88 mg/dL (ref 0.44–1.00)
GFR, Estimated: >60 mL/min (ref >60)
Glucose, Bld: 108 mg/dL — ABNORMAL HIGH (ref 70–99)
Potassium: 3.9 mmol/L (ref 3.5–5.1)
Sodium: 139 mmol/L (ref 135–145)
Total Bilirubin: 0.4 mg/dL (ref 0.0–1.2)
Total Protein: 7.4 g/dL (ref 6.5–8.1)

## 2024-02-15 LAB — CBC WITH DIFFERENTIAL (CANCER CENTER ONLY)
Abs Immature Granulocytes: 0.01 10*3/uL (ref 0.00–0.07)
Basophils Absolute: 0 10*3/uL (ref 0.0–0.1)
Basophils Relative: 1 %
Eosinophils Absolute: 0.1 10*3/uL (ref 0.0–0.5)
Eosinophils Relative: 3 %
HCT: 36.5 % (ref 36.0–46.0)
Hemoglobin: 12.4 g/dL (ref 12.0–15.0)
Immature Granulocytes: 0 %
Lymphocytes Relative: 40 %
Lymphs Abs: 1.6 10*3/uL (ref 0.7–4.0)
MCH: 30.1 pg (ref 26.0–34.0)
MCHC: 34 g/dL (ref 30.0–36.0)
MCV: 88.6 fL (ref 80.0–100.0)
Monocytes Absolute: 0.3 10*3/uL (ref 0.1–1.0)
Monocytes Relative: 8 %
Neutro Abs: 1.9 10*3/uL (ref 1.7–7.7)
Neutrophils Relative %: 48 %
Platelet Count: 208 10*3/uL (ref 150–400)
RBC: 4.12 MIL/uL (ref 3.87–5.11)
RDW: 13.2 % (ref 11.5–15.5)
WBC Count: 3.9 10*3/uL — ABNORMAL LOW (ref 4.0–10.5)
nRBC: 0 % (ref 0.0–0.2)

## 2024-02-15 LAB — LACTATE DEHYDROGENASE: LDH: 101 U/L (ref 98–192)

## 2024-02-15 LAB — SAVE SMEAR(SSMR), FOR PROVIDER SLIDE REVIEW

## 2024-02-15 NOTE — Progress Notes (Signed)
 Hematology and Oncology Follow Up Visit  [name] [id number] 05/22/1971 52 y.o. 02/15/2024   Principle Diagnosis:  Chronic phase CML- major molecular remission   Current Therapy:        Sprycel  50 mg by mouth daily    Interim History:  [name] is here today for follow-up.  Thankfully, the Sprycel  is working nicely.  When we last saw her, unfortunately, less than that we saw her, her was down to 1%.  She is doing well.  She is excited that her parents will be traveling up from [place].  They will be coming up in August and staying for about 3 months.  She has had no problems with nausea or vomiting.  She has had no problems with cough or shortness of breath.  She has had no problems with rashes.  There is been no bleeding.  She has had no change in bowel or bladder habits.  She and her daughter to have a wonderful time open in [place] I think back in April.  There were 2 days in [place] in 2 days in [place].  She has had no fever.  She has had no issues with COVID or Influenza.  There is been no leg swelling.  Overall, I would have to say that her performance status is ECOG 0.    Medications:  Allergies as of 02/15/2024       Reactions   Chocolate Flavoring Agent (non-screening) Hives        Medication List        Accurate as of February 15, 2024  3:59 PM. If you have any questions, ask your nurse or doctor.          atorvastatin  20 MG tablet Commonly known as: LIPITOR Take 1 tablet (20 mg total) by mouth daily.   calcium  carbonate 1250 (500 Ca) MG tablet Commonly known as: OS-CAL - dosed in mg of elemental calcium  Take 1 tablet by mouth daily.   dasatinib  50 MG tablet Commonly known as: SPRYCEL  Tome 1 tableta (50 mg en total) por va oral diariamente. (Take 1 tablet (50 mg total) by mouth daily.)   EPINEPHrine  0.3 mg/0.3 mL Soaj injection Commonly known as: EpiPen  2-Pak Inject 0.3 mg into the muscle as needed for anaphylaxis.   fexofenadine  180 MG  tablet Commonly known as: ALLEGRA  Take 180 mg by mouth daily.   FLUoxetine  10 MG capsule Commonly known as: PROZAC  Take 2 capsules (20 mg total) by mouth daily.   fluticasone  50 MCG/ACT nasal spray Commonly known as: FLONASE  Place 2 sprays into both nostrils daily.   IRON-C PO Take by mouth daily.   Magnesium  100 MG Caps 200 mg daily.   nystatin -triamcinolone  ointment Commonly known as: MYCOLOG Apply topically daily as needed.   Vitamin D  (Cholecalciferol) 25 MCG (1000 UT) Tabs Take 1,000 Units by mouth daily.   Vitamins/Minerals Tabs Take by mouth daily.        Allergies:  Allergies  Allergen Reactions   Chocolate Flavoring Agent (Non-Screening) Hives    Past Medical History, Surgical history, Social history, and Family History were reviewed and updated.  Review of Systems: Review of Systems  Constitutional: Negative.   HENT: Negative.    Eyes:  Positive for discharge and redness.  Respiratory: Negative.    Cardiovascular: Negative.   Gastrointestinal: Negative.   Genitourinary: Negative.   Musculoskeletal: Negative.   Skin:  Positive for itching and rash.  Neurological: Negative.   Endo/Heme/Allergies: Negative.   Psychiatric/Behavioral: Negative.  Physical Exam:  height is 5' 7 (1.702 m) and weight is 220 lb (99.8 kg). Her oral temperature is 98.3 F (36.8 C). Her blood pressure is 126/73 and her pulse is 58 (abnormal). Her respiration is 20 and oxygen saturation is 100%.   Wt Readings from Last 3 Encounters:  02/15/24 220 lb (99.8 kg)  01/26/24 221 lb 2 oz (100.3 kg)  12/25/23 221 lb 2 oz (100.3 kg)   Her vital signs are temperature of 98.8.  Pulse 69.  Blood pressure 126/58.  Weight is 223 pounds.   Physical Exam Vitals reviewed.  HENT:     Head: Normocephalic and atraumatic.     Comments: Head and neck exam shows erythema and swelling under the left eye.  Maybe a little bit of erythema with the conjunctive of the left eye.  Her pupil  reacts appropriately.  There is no adenopathy in the neck. Eyes:     Pupils: Pupils are equal, round, and reactive to light.  Cardiovascular:     Rate and Rhythm: Normal rate and regular rhythm.     Heart sounds: Normal heart sounds.  Pulmonary:     Effort: Pulmonary effort is normal.     Breath sounds: Normal breath sounds.  Abdominal:     General: Bowel sounds are normal.     Palpations: Abdomen is soft.  Musculoskeletal:        General: No tenderness or deformity. Normal range of motion.     Cervical back: Normal range of motion.  Lymphadenopathy:     Cervical: No cervical adenopathy.  Skin:    General: Skin is warm and dry.     Findings: No erythema or rash.     Comments: She has some scattered papular type lesions on her arms secondary to the poison ivy.  Neurological:     Mental Status: She is alert and oriented to person, place, and time.  Psychiatric:        Behavior: Behavior normal.        Thought Content: Thought content normal.        Judgment: Judgment normal.     Lab Results  Component Value Date   WBC 3.9 (L) 02/15/2024   HGB 12.4 02/15/2024   HCT 36.5 02/15/2024   MCV 88.6 02/15/2024   PLT 208 02/15/2024   Lab Results  Component Value Date   FERRITIN 170 05/26/2022   IRON 41 05/26/2022   TIBC 323 05/26/2022   UIBC 282 05/26/2022   IRONPCTSAT 13 05/26/2022   Lab Results  Component Value Date   RBC 4.12 02/15/2024   No results found for: KPAFRELGTCHN, LAMBDASER, KAPLAMBRATIO No results found for: IGGSERUM, IGA, IGMSERUM No results found for: STEPHANY CARLOTA BENSON MARKEL EARLA JOANNIE DOC VICK, SPEI   Chemistry      Component Value Date/Time   NA 139 02/15/2024 1456   NA 143 08/10/2017 1518   K 3.9 02/15/2024 1456   K 4.2 08/10/2017 1518   CL 103 02/15/2024 1456   CL 106 08/10/2017 1518   CO2 28 02/15/2024 1456   CO2 31 08/10/2017 1518   BUN 15 02/15/2024 1456   BUN 21 08/10/2017 1518    CREATININE 0.88 02/15/2024 1456   CREATININE 0.8 08/10/2017 1518      Component Value Date/Time   CALCIUM  9.5 02/15/2024 1456   CALCIUM  9.2 08/10/2017 1518   ALKPHOS 73 02/15/2024 1456   ALKPHOS 65 08/10/2017 1518   AST 20 02/15/2024 1456   ALT 20 02/15/2024  1456   ALT 24 08/10/2017 1518   BILITOT 0.4 02/15/2024 1456       Impression and Plan: Ms. Stracener is a very pleasant 53 yo Hispanic female with chronic phase CML in MMR.  She did have a relapse back in 2017.  We had her on Sprycel  for the relapse.  He subsequently had discontinued this in October.  Unfortunately, she relapsed.  We now have her back on Sprycel .  Hopefully, should back in a major molecular remission.  We will see what her BCR/ABL is now.  I think we can now get her back in the Autumn.  Maybe, her parents will be able to come with her.  I would love to meet them.   Maude JONELLE Crease, MD 7/3/20253:59 PM

## 2024-02-22 ENCOUNTER — Ambulatory Visit: Payer: Self-pay | Admitting: Hematology & Oncology

## 2024-02-22 ENCOUNTER — Encounter: Payer: Self-pay | Admitting: Hematology & Oncology

## 2024-02-22 LAB — BCR-ABL1, CML/ALL, PCR, QUANT
E1A2 Transcript: <0.0032 %
b2a2 transcript: 0.0768 %
b3a2 transcript: <0.0032 %

## 2024-02-23 ENCOUNTER — Encounter: Payer: Self-pay | Admitting: *Deleted

## 2024-02-26 ENCOUNTER — Encounter: Payer: Self-pay | Admitting: Gastroenterology

## 2024-03-06 ENCOUNTER — Other Ambulatory Visit: Payer: Self-pay

## 2024-03-06 ENCOUNTER — Other Ambulatory Visit: Payer: Self-pay | Admitting: Hematology & Oncology

## 2024-03-06 MED ORDER — DASATINIB 50 MG PO TABS
50.0000 mg | ORAL_TABLET | Freq: Every day | ORAL | 2 refills | Status: DC
  Filled 2024-03-06: qty 30, 30d supply, fill #0
  Filled 2024-04-04: qty 30, 30d supply, fill #1
  Filled 2024-05-06: qty 30, 30d supply, fill #2

## 2024-03-06 NOTE — Progress Notes (Signed)
 Specialty Pharmacy Refill Coordination Note  [name] is a 53 y.o. female contacted today regarding refills of specialty medication(s) Dasatinib  (SPRYCEL )   Patient requested Delivery   Delivery date: 03/12/24   Verified address: [address] [place], [address]   Medication will be filled on 03/11/24.   This fill date is pending response to refill request from provider. Patient is aware and if they have not received fill by intended date they must follow up with pharmacy.

## 2024-03-11 ENCOUNTER — Other Ambulatory Visit: Payer: Self-pay

## 2024-03-14 ENCOUNTER — Other Ambulatory Visit: Payer: Self-pay

## 2024-03-14 ENCOUNTER — Ambulatory Visit (AMBULATORY_SURGERY_CENTER)

## 2024-03-14 VITALS — Ht 63.0 in | Wt 220.0 lb

## 2024-03-14 DIAGNOSIS — Z1211 Encounter for screening for malignant neoplasm of colon: Secondary | ICD-10-CM

## 2024-03-14 MED ORDER — NA SULFATE-K SULFATE-MG SULF 17.5-3.13-1.6 GM/177ML PO SOLN: 1.0000 | Freq: Once | ORAL | 0 refills | Status: AC

## 2024-03-14 NOTE — Progress Notes (Signed)
 Denies allergies to eggs or soy products. Denies complication of anesthesia or sedation. Denies use of weight loss medication. Denies use of O2.   Emmi instructions given for colonoscopy.

## 2024-03-15 ENCOUNTER — Encounter: Payer: Self-pay | Admitting: Gastroenterology

## 2024-03-28 ENCOUNTER — Ambulatory Visit (AMBULATORY_SURGERY_CENTER): Admitting: Gastroenterology

## 2024-03-28 ENCOUNTER — Telehealth: Payer: Self-pay | Admitting: Gastroenterology

## 2024-03-28 ENCOUNTER — Encounter: Payer: Self-pay | Admitting: Gastroenterology

## 2024-03-28 VITALS — BP 133/77 | HR 58 | Temp 97.9°F | Resp 10 | Ht 67.0 in | Wt 220.0 lb

## 2024-03-28 DIAGNOSIS — Z1211 Encounter for screening for malignant neoplasm of colon: Secondary | ICD-10-CM

## 2024-03-28 DIAGNOSIS — K5140 Inflammatory polyps of colon without complications: Secondary | ICD-10-CM | POA: Diagnosis not present

## 2024-03-28 DIAGNOSIS — D123 Benign neoplasm of transverse colon: Secondary | ICD-10-CM

## 2024-03-28 MED ORDER — SODIUM CHLORIDE 0.9 % IV SOLN: 500.0000 mL | Freq: Once | INTRAVENOUS | Status: DC

## 2024-03-28 NOTE — Progress Notes (Signed)
 History and Physical:  This patient presents for endoscopic testing for: Encounter Diagnosis  Name Primary?   Special screening for malignant neoplasms, colon Yes    Average risk for colorectal cancer.  1st screening exam.  Patient denies chronic abdominal pain, rectal bleeding, constipation or diarrhea.   Patient is otherwise without complaints or active issues today.   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 30 tablet 2   Ferrous Gluconate-C-Folic Acid (IRON-C PO) Take by mouth daily.     fexofenadine  (ALLEGRA ) 180 MG tablet Take 180 mg by mouth daily.     FLUoxetine  (PROZAC ) 10 MG capsule Take 2 capsules (20 mg total) by mouth daily. 180 capsule 1   fluticasone  (FLONASE ) 50 MCG/ACT nasal spray Place 2 sprays into both nostrils daily. 16 g 6   Magnesium  100 MG CAPS 200 mg daily.     nystatin -triamcinolone  ointment (MYCOLOG) Apply topically daily as needed. 30 g 1   Vitamin D , Cholecalciferol, 25 MCG (1000 UT) TABS Take 1,000 Units by mouth daily.     Vitamins/Minerals TABS Take by mouth daily.      atorvastatin  (LIPITOR) 20 MG tablet Take 1 tablet (20 mg total) by mouth daily.     calcium  carbonate (OS-CAL - DOSED IN MG OF ELEMENTAL CALCIUM ) 1250 (500 Ca) MG tablet Take 1 tablet by mouth daily.      EPINEPHrine  (EPIPEN  2-PAK) 0.3 mg/0.3 mL IJ SOAJ injection Inject 0.3 mg into the muscle as needed for anaphylaxis. 2 each 3   Current Facility-Administered Medications  Medication Dose Route Frequency Provider Last Rate Last Admin   0.9 %  sodium chloride  infusion  500 mL Intravenous Once Danis, Eufemio Strahm L III, MD          ___________________________________________________________________ Objective   Exam:  BP 120/79   Pulse (!) 53   Temp 97.9 F (36.6 C)   Ht 5' 7 (1.702 m)   Wt 220 lb (99.8 kg)   LMP  (LMP Unknown)   SpO2 100%   BMI 34.46 kg/m   CV: regular , S1/S2 Resp: clear to auscultation bilaterally, normal RR and effort noted GI: soft, no tenderness, with active bowel sounds.   Assessment: Encounter Diagnosis  Name Primary?   Special screening for malignant neoplasms, colon Yes     Plan: Colonoscopy   The benefits and risks of the planned procedure(s) were described in detail with the patient or (when appropriate) their health care proxy.  Risks were outlined as including, but not limited to, bleeding, infection, perforation, adverse medication reaction leading to cardiac or pulmonary decompensation, pancreatitis (if ERCP).  The limitation of incomplete mucosal visualization was also discussed.  No guarantees or warranties were given.  The patient is appropriate for an  endoscopic procedure in the ambulatory setting.   - Victory Brand, MD

## 2024-03-28 NOTE — Patient Instructions (Signed)
-  Handout on polyps provided -Await pathology results  YOU HAD AN ENDOSCOPIC PROCEDURE TODAY AT THE [facility]:   Refer to the procedure report that was given to you for any specific questions about what was found during the examination.  If the procedure report does not answer your questions, please call your gastroenterologist to clarify.  If you requested that your care partner not be given the details of your procedure findings, then the procedure report has been included in a sealed envelope for you to review at your convenience later.  YOU SHOULD EXPECT: Some feelings of bloating in the abdomen. Passage of more gas than usual.  Walking can help get rid of the air that was put into your GI tract during the procedure and reduce the bloating. If you had a lower endoscopy (such as a colonoscopy or flexible sigmoidoscopy) you may notice spotting of blood in your stool or on the toilet paper. If you underwent a bowel prep for your procedure, you may not have a normal bowel movement for a few days.  Please Note:  You might notice some irritation and congestion in your nose or some drainage.  This is from the oxygen used during your procedure.  There is no need for concern and it should clear up in a day or so.  SYMPTOMS TO REPORT IMMEDIATELY:  Following lower endoscopy (colonoscopy or flexible sigmoidoscopy):  Excessive amounts of blood in the stool  Significant tenderness or worsening of abdominal pains  Swelling of the abdomen that is new, acute  Fever of 100F or higher  For urgent or emergent issues, a gastroenterologist can be reached at any hour by calling [phone]. Do not use MyChart messaging for urgent concerns.    DIET:  We do recommend a small meal at first, but then you may proceed to your regular diet.  Drink plenty of fluids but you should avoid alcoholic beverages for 24 hours.  ACTIVITY:  You should plan to take it easy for the rest of today and you should NOT  DRIVE or use heavy machinery until tomorrow (because of the sedation medicines used during the test).    FOLLOW UP: Our staff will call the number listed on your records the next business day following your procedure.  We will call around 7:15- 8:00 am to check on you and address any questions or concerns that you may have regarding the information given to you following your procedure. If we do not reach you, we will leave a message.     If any biopsies were taken you will be contacted by phone or by letter within the next 1-3 weeks.  Please call us  at [phone] if you have not heard about the biopsies in 3 weeks.    SIGNATURES/CONFIDENTIALITY: You and/or your care partner have signed paperwork which will be entered into your electronic medical record.  These signatures attest to the fact that that the information above on your After Visit Summary has been reviewed and is understood.  Full responsibility of the confidentiality of this discharge information lies with you and/or your care-partner.

## 2024-03-28 NOTE — Progress Notes (Signed)
 Pt's states no medical or surgical changes since previsit or office visit.

## 2024-03-28 NOTE — Telephone Encounter (Signed)
 Spoke with pt. She drank the prep at about 6:30am and vomited at about 7:20am. States her stools are clear, green liquid. Instructed pt that her bowels seem appropriate to proceed with colonoscopy today.

## 2024-03-28 NOTE — Progress Notes (Signed)
 Called to room to assist during endoscopic procedure.  Patient ID and intended procedure confirmed with present staff. Received instructions for my participation in the procedure from the performing physician.

## 2024-03-28 NOTE — Telephone Encounter (Signed)
 Inbound call from patient stating that she is scheduled to have a colonoscopy this morning at 10:30 with Dr. Legrand. She states she took her second round of prep and got sick to her stomach and vomited. Patient is requesting a call back to discuss. Please advise.

## 2024-03-28 NOTE — Op Note (Signed)
 [facility] Patient Name: [name] Procedure Date: 03/28/2024 11:38 AM MRN: [id number] Endoscopist: Victory L. Legrand , MD, [id number] Age: 53 Referring MD:  Date of Birth: [birth year] Gender: Female Account #: [id number] Procedure:                Colonoscopy Indications:              Screening for colorectal malignant neoplasm, This                            is the patient's first colonoscopy                           Negative Cologuard February 2022 Medicines:                Monitored Anesthesia Care Procedure:                Pre-Anesthesia Assessment:                           - Prior to the procedure, a History and Physical                            was performed, and patient medications and                            allergies were reviewed. The patient's tolerance of                            previous anesthesia was also reviewed. The risks                            and benefits of the procedure and the sedation                            options and risks were discussed with the patient.                            All questions were answered, and informed consent                            was obtained. Prior Anticoagulants: The patient has                            taken no anticoagulant or antiplatelet agents. ASA                            Grade Assessment: II - A patient with mild systemic                            disease. After reviewing the risks and benefits,                            the patient was deemed in satisfactory condition to  undergo the procedure.                           After obtaining informed consent, the colonoscope                            was passed under direct vision. Throughout the                            procedure, the patient's blood pressure, pulse, and                            oxygen saturations were monitored continuously. The                            CF [id number] #[id number] was introduced  through the anus                            and advanced to the the terminal ileum, with                            identification of the appendiceal orifice and IC                            valve. The colonoscopy was performed without                            difficulty. The patient tolerated the procedure                            well. The quality of the bowel preparation was                            excellent. The terminal ileum, ileocecal valve,                            appendiceal orifice, and rectum were photographed. Scope In: 11:53:06 AM Scope Out: 12:15:16 PM Scope Withdrawal Time: 0 hours 19 minutes 21 seconds  Total Procedure Duration: 0 hours 22 minutes 10 seconds  Findings:                 The perianal and digital rectal examinations were                            normal.                           The terminal ileum appeared normal.                           Repeat examination of right colon under NBI                            performed.  An 8-10 mm polyp was found in the distal transverse                            colon. The polyp was pedunculated. The polyp was                            removed with a hot snare. Resection and retrieval                            were complete.                           The exam was otherwise without abnormality on                            direct and retroflexion views. Complications:            No immediate complications. Estimated Blood Loss:     Estimated blood loss was minimal. Impression:               - The examined portion of the ileum was normal.                           - One 8-10 mm polyp in the distal transverse colon,                            removed with a hot snare. Resected and retrieved.                           - The examination was otherwise normal on direct                            and retroflexion views. Recommendation:           - Patient has a contact number available for                             emergencies. The signs and symptoms of potential                            delayed complications were discussed with the                            patient. Return to normal activities tomorrow.                            Written discharge instructions were provided to the                            patient.                           - Resume previous diet.                           - Continue present  medications.                           - Await pathology results.                           - Repeat colonoscopy is recommended for                            surveillance. The colonoscopy date will be                            determined after pathology results from today's                            exam become available for review. Carols Clemence L. Legrand, MD 03/28/2024 12:19:35 PM This report has been signed electronically.

## 2024-03-28 NOTE — Progress Notes (Signed)
 Report given to PACU, vss

## 2024-03-29 ENCOUNTER — Telehealth: Payer: Self-pay | Admitting: Lactation Services

## 2024-03-29 NOTE — Telephone Encounter (Signed)
  Follow up Call-     03/28/2024   10:55 AM  Call back number  Post procedure Call Back phone  # [phone]  Permission to leave phone message Yes     Patient questions:  Do you have a fever, pain , or abdominal swelling? No. Pain Score  0 *  Have you tolerated food without any problems? Yes.    Have you been able to return to your normal activities? Yes.    Do you have any questions about your discharge instructions: Diet   No. Medications  No. Follow up visit  No.  Do you have questions or concerns about your Care? No.  Actions: * If pain score is 4 or above: No action needed, pain <4.

## 2024-04-01 ENCOUNTER — Other Ambulatory Visit: Payer: Self-pay

## 2024-04-02 ENCOUNTER — Ambulatory Visit: Payer: Self-pay | Admitting: Gastroenterology

## 2024-04-02 LAB — SURGICAL PATHOLOGY

## 2024-04-04 ENCOUNTER — Other Ambulatory Visit: Payer: Self-pay

## 2024-04-04 ENCOUNTER — Other Ambulatory Visit: Payer: Self-pay | Admitting: Pharmacy Technician

## 2024-04-04 NOTE — Progress Notes (Signed)
 Specialty Pharmacy Refill Coordination Note  [name] is a 53 y.o. female contacted today regarding refills of specialty medication(s) Dasatinib  (SPRYCEL )   Patient requested Delivery   Delivery date: 04/11/24   Verified address: [address] [place] [address]   Medication will be filled on 04/11/24.

## 2024-04-05 ENCOUNTER — Other Ambulatory Visit: Payer: Self-pay

## 2024-04-26 ENCOUNTER — Other Ambulatory Visit: Payer: Self-pay

## 2024-04-30 ENCOUNTER — Other Ambulatory Visit: Payer: Self-pay

## 2024-05-02 ENCOUNTER — Other Ambulatory Visit: Payer: Self-pay

## 2024-05-03 ENCOUNTER — Other Ambulatory Visit (HOSPITAL_COMMUNITY): Payer: Self-pay

## 2024-05-06 ENCOUNTER — Encounter (INDEPENDENT_AMBULATORY_CARE_PROVIDER_SITE_OTHER): Payer: Self-pay

## 2024-05-06 ENCOUNTER — Other Ambulatory Visit (HOSPITAL_COMMUNITY): Payer: Self-pay

## 2024-05-06 ENCOUNTER — Other Ambulatory Visit: Payer: Self-pay

## 2024-05-06 NOTE — Progress Notes (Signed)
 Specialty Pharmacy Refill Coordination Note  [name] is a 53 y.o. female contacted today regarding refills of specialty medication(s) Dasatinib  (SPRYCEL )   Patient requested (Patient-Rptd) Delivery   Delivery date: 05/13/24   Verified address: (Patient-Rptd) [address], [place] [address]   Medication will be filled on 05/10/24.

## 2024-05-07 ENCOUNTER — Other Ambulatory Visit: Payer: Self-pay

## 2024-05-09 ENCOUNTER — Other Ambulatory Visit: Payer: Self-pay

## 2024-05-16 ENCOUNTER — Inpatient Hospital Stay: Admitting: Hematology & Oncology

## 2024-05-16 ENCOUNTER — Inpatient Hospital Stay

## 2024-05-23 ENCOUNTER — Inpatient Hospital Stay (HOSPITAL_BASED_OUTPATIENT_CLINIC_OR_DEPARTMENT_OTHER): Admitting: Medical Oncology

## 2024-05-23 ENCOUNTER — Inpatient Hospital Stay: Attending: Hematology & Oncology

## 2024-05-23 ENCOUNTER — Encounter: Payer: Self-pay | Admitting: Medical Oncology

## 2024-05-23 VITALS — BP 124/74 | HR 58 | Temp 99.0°F | Resp 18 | Ht 67.0 in | Wt 219.0 lb

## 2024-05-23 DIAGNOSIS — C9210 Chronic myeloid leukemia, BCR/ABL-positive, not having achieved remission: Secondary | ICD-10-CM | POA: Diagnosis not present

## 2024-05-23 DIAGNOSIS — F321 Major depressive disorder, single episode, moderate: Secondary | ICD-10-CM | POA: Insufficient documentation

## 2024-05-23 LAB — CMP (CANCER CENTER ONLY)
ALT: 26 U/L (ref 0–44)
AST: 25 U/L (ref 15–41)
Albumin: 4.4 g/dL (ref 3.5–5.0)
Alkaline Phosphatase: 92 U/L (ref 38–126)
Anion gap: 10 (ref 5–15)
BUN: 16 mg/dL (ref 6–20)
CO2: 27 mmol/L (ref 22–32)
Calcium: 9 mg/dL (ref 8.9–10.3)
Chloride: 103 mmol/L (ref 98–111)
Creatinine: 0.85 mg/dL (ref 0.44–1.00)
GFR, Estimated: >60 mL/min (ref >60)
Glucose, Bld: 110 mg/dL — ABNORMAL HIGH (ref 70–99)
Potassium: 4 mmol/L (ref 3.5–5.1)
Sodium: 139 mmol/L (ref 135–145)
Total Bilirubin: 0.3 mg/dL (ref 0.0–1.2)
Total Protein: 7 g/dL (ref 6.5–8.1)

## 2024-05-23 LAB — CBC WITH DIFFERENTIAL (CANCER CENTER ONLY)
Abs Immature Granulocytes: 0.03 K/uL (ref 0.00–0.07)
Basophils Absolute: 0 K/uL (ref 0.0–0.1)
Basophils Relative: 1 %
Eosinophils Absolute: 0.1 K/uL (ref 0.0–0.5)
Eosinophils Relative: 2 %
HCT: 37.3 % (ref 36.0–46.0)
Hemoglobin: 12.4 g/dL (ref 12.0–15.0)
Immature Granulocytes: 1 %
Lymphocytes Relative: 28 %
Lymphs Abs: 1.4 K/uL (ref 0.7–4.0)
MCH: 30.3 pg (ref 26.0–34.0)
MCHC: 33.2 g/dL (ref 30.0–36.0)
MCV: 91.2 fL (ref 80.0–100.0)
Monocytes Absolute: 0.4 K/uL (ref 0.1–1.0)
Monocytes Relative: 7 %
Neutro Abs: 2.9 K/uL (ref 1.7–7.7)
Neutrophils Relative %: 61 %
Platelet Count: 228 K/uL (ref 150–400)
RBC: 4.09 MIL/uL (ref 3.87–5.11)
RDW: 13.3 % (ref 11.5–15.5)
WBC Count: 4.8 K/uL (ref 4.0–10.5)
nRBC: 0 % (ref 0.0–0.2)

## 2024-05-23 LAB — LACTATE DEHYDROGENASE: LDH: 163 U/L (ref 98–192)

## 2024-05-23 LAB — SAVE SMEAR(SSMR), FOR PROVIDER SLIDE REVIEW

## 2024-05-23 NOTE — Progress Notes (Signed)
 Hematology and Oncology Follow Up Visit  KANG ISHIDA [id number] 1970-09-03 53 y.o. 05/23/2024   Principle Diagnosis:  Chronic phase CML- major molecular remission   Current Therapy:        Sprycel  50 mg by mouth daily    Interim History:  Ms. Dormer is here today for follow-up.  Thankfully, the Sprycel  is working nicely.   She is doing well.  She has had no problems with nausea or vomiting.  She has had no problems with cough or shortness of breath.  She has had no problems with rashes.  There is been no bleeding.  She has had no change in bowel or bladder habits.  She has had no fever.  She has had no issues with COVID or Influenza.  There is been no leg swelling.  No unintentional weight loss, night sweats, new bone pains.   Overall, I would have to say that her performance status is ECOG 0.    Wt Readings from Last 3 Encounters:  05/23/24 219 lb (99.3 kg)  03/28/24 220 lb (99.8 kg)  03/14/24 220 lb (99.8 kg)   Medications:  Allergies as of 05/23/2024       Reactions   Chocolate Flavoring Agent (non-screening) Hives        Medication List        Accurate as of May 23, 2024  4:04 PM. If you have any questions, ask your nurse or doctor.          atorvastatin  20 MG tablet Commonly known as: LIPITOR Take 1 tablet (20 mg total) by mouth daily.   calcium  carbonate 1250 (500 Ca) MG tablet Commonly known as: OS-CAL - dosed in mg of elemental calcium  Take 1 tablet by mouth daily.   dasatinib  50 MG tablet Commonly known as: SPRYCEL  Tome 1 tableta (50 mg en total) por va oral diariamente. (Take 1 tablet (50 mg total) by mouth daily.)   EPINEPHrine  0.3 mg/0.3 mL Soaj injection Commonly known as: EpiPen  2-Pak Inject 0.3 mg into the muscle as needed for anaphylaxis.   fexofenadine  180 MG tablet Commonly known as: ALLEGRA  Take 180 mg by mouth daily.   FLUoxetine  10 MG capsule Commonly known as: PROZAC  Take 2 capsules (20 mg total) by mouth daily.    fluticasone  50 MCG/ACT nasal spray Commonly known as: FLONASE  Place 2 sprays into both nostrils daily.   IRON-C PO Take by mouth daily.   Magnesium  100 MG Caps 200 mg daily.   nystatin -triamcinolone  ointment Commonly known as: MYCOLOG Apply topically daily as needed.   Vitamin D  (Cholecalciferol) 25 MCG (1000 UT) Tabs Take 1,000 Units by mouth daily.   Vitamins/Minerals Tabs Take by mouth daily.        Allergies:  Allergies  Allergen Reactions   Chocolate Flavoring Agent (Non-Screening) Hives    Past Medical History, Surgical history, Social history, and Family History were reviewed and updated.  Review of Systems: Review of Systems  Constitutional: Negative.   HENT: Negative.    Eyes:  Negative for discharge and redness.  Respiratory: Negative.    Cardiovascular: Negative.   Gastrointestinal: Negative.   Genitourinary: Negative.   Musculoskeletal: Negative.   Skin:  Negative for itching and rash.  Neurological: Negative.   Endo/Heme/Allergies: Negative.   Psychiatric/Behavioral: Negative.     Physical Exam:  height is 5' 7 (1.702 m) and weight is 219 lb (99.3 kg). Her oral temperature is 99 F (37.2 C). Her blood pressure is 124/74 and her pulse is 58 (  abnormal). Her respiration is 18 and oxygen saturation is 100%.   Wt Readings from Last 3 Encounters:  05/23/24 219 lb (99.3 kg)  03/28/24 220 lb (99.8 kg)  03/14/24 220 lb (99.8 kg)    Physical Exam Vitals reviewed.  HENT:     Head: Normocephalic and atraumatic.  Eyes:     Pupils: Pupils are equal, round, and reactive to light.  Cardiovascular:     Rate and Rhythm: Normal rate and regular rhythm.     Heart sounds: Normal heart sounds.  Pulmonary:     Effort: Pulmonary effort is normal.     Breath sounds: Normal breath sounds.  Abdominal:     General: Bowel sounds are normal.     Palpations: Abdomen is soft.  Musculoskeletal:        General: No tenderness or deformity. Normal range of motion.      Cervical back: Normal range of motion.  Lymphadenopathy:     Cervical: No cervical adenopathy.  Skin:    General: Skin is warm and dry.     Findings: No erythema or rash.  Neurological:     Mental Status: She is alert and oriented to person, place, and time.  Psychiatric:        Behavior: Behavior normal.        Thought Content: Thought content normal.        Judgment: Judgment normal.     Lab Results  Component Value Date   WBC 4.8 05/23/2024   HGB 12.4 05/23/2024   HCT 37.3 05/23/2024   MCV 91.2 05/23/2024   PLT 228 05/23/2024   Lab Results  Component Value Date   FERRITIN 170 05/26/2022   IRON 41 05/26/2022   TIBC 323 05/26/2022   UIBC 282 05/26/2022   IRONPCTSAT 13 05/26/2022   Lab Results  Component Value Date   RBC 4.09 05/23/2024   No results found for: KPAFRELGTCHN, LAMBDASER, KAPLAMBRATIO No results found for: KIMBERLY LE, IGMSERUM No results found for: STEPHANY CARLOTA BENSON MARKEL EARLA JOANNIE DOC VICK, SPEI   Chemistry      Component Value Date/Time   NA 139 05/23/2024 1524   NA 143 08/10/2017 1518   K 4.0 05/23/2024 1524   K 4.2 08/10/2017 1518   CL 103 05/23/2024 1524   CL 106 08/10/2017 1518   CO2 27 05/23/2024 1524   CO2 31 08/10/2017 1518   BUN 16 05/23/2024 1524   BUN 21 08/10/2017 1518   CREATININE 0.85 05/23/2024 1524   CREATININE 0.8 08/10/2017 1518      Component Value Date/Time   CALCIUM  9.0 05/23/2024 1524   CALCIUM  9.2 08/10/2017 1518   ALKPHOS 92 05/23/2024 1524   ALKPHOS 65 08/10/2017 1518   AST 25 05/23/2024 1524   ALT 26 05/23/2024 1524   ALT 24 08/10/2017 1518   BILITOT 0.3 05/23/2024 1524     Encounter Diagnosis  Name Primary?   CML (chronic myelocytic leukemia) (HCC) Yes    Impression and Plan: Ms. Morris is a very pleasant 53 yo Hispanic female with chronic phase CML in MMR.  She did have a relapse back in 2017.  We had her on Sprycel  for the relapse.  We  subsequently had discontinued this in October.  Unfortunately, she relapsed.  We now have her back on Sprycel  which she is tolerating well.  BCR/ABL pending Today CBC looks great CMP stable LDH up slightly- we will monitor this  RTC 3 months MD, labs   Lauraine CHRISTELLA Dais,  PA-C 10/9/20254:04 PM

## 2024-05-24 ENCOUNTER — Encounter: Payer: Self-pay | Admitting: Internal Medicine

## 2024-05-24 ENCOUNTER — Ambulatory Visit: Admitting: Internal Medicine

## 2024-05-24 VITALS — BP 116/72 | HR 63 | Temp 98.3°F | Resp 16 | Ht 67.0 in | Wt 218.5 lb

## 2024-05-24 DIAGNOSIS — Z23 Encounter for immunization: Secondary | ICD-10-CM | POA: Diagnosis not present

## 2024-05-24 DIAGNOSIS — E785 Hyperlipidemia, unspecified: Secondary | ICD-10-CM

## 2024-05-24 DIAGNOSIS — F419 Anxiety disorder, unspecified: Secondary | ICD-10-CM

## 2024-05-24 DIAGNOSIS — F909 Attention-deficit hyperactivity disorder, unspecified type: Secondary | ICD-10-CM

## 2024-05-24 DIAGNOSIS — Z0000 Encounter for general adult medical examination without abnormal findings: Secondary | ICD-10-CM

## 2024-05-24 DIAGNOSIS — E559 Vitamin D deficiency, unspecified: Secondary | ICD-10-CM | POA: Diagnosis not present

## 2024-05-24 DIAGNOSIS — Z0001 Encounter for general adult medical examination with abnormal findings: Secondary | ICD-10-CM

## 2024-05-24 DIAGNOSIS — R739 Hyperglycemia, unspecified: Secondary | ICD-10-CM

## 2024-05-24 LAB — LIPID PANEL
Cholesterol: 205 mg/dL — ABNORMAL HIGH (ref 0–200)
HDL: 73.9 mg/dL (ref >39.00)
LDL Cholesterol: 118 mg/dL — ABNORMAL HIGH (ref 0–99)
NonHDL: 131.14
Total CHOL/HDL Ratio: 3
Triglycerides: 65 mg/dL (ref 0.0–149.0)
VLDL: 13 mg/dL (ref 0.0–40.0)

## 2024-05-24 LAB — VITAMIN D 25 HYDROXY (VIT D DEFICIENCY, FRACTURES): VITD: 20.03 ng/mL — ABNORMAL LOW (ref 30.00–100.00)

## 2024-05-24 LAB — HEMOGLOBIN A1C: Hgb A1c MFr Bld: 5.8 % (ref 4.6–6.5)

## 2024-05-24 MED ORDER — FLUOXETINE HCL 40 MG PO CAPS: 40.0000 mg | ORAL_CAPSULE | Freq: Every day | ORAL | 3 refills | Status: AC

## 2024-05-24 NOTE — Progress Notes (Signed)
 Subjective:    Patient ID: [name], female    DOB: 07/06/1971, 53 y.o.   MRN: [id number]  DOS:  05/24/2024 CPX  Discussed the use of AI scribe software for clinical note transcription with the patient, who gave verbal consent to proceed.  History of Present Illness   Glycemic concerns - Concerned about blood glucose levels - Recent postprandial blood glucose reading of 110 mg/dL  Hyperlipidemia and medication intolerance - Concerned about cholesterol levels - Discontinued atorvastatin  due to concerns about its relation to hives - Questions the need to resume atorvastatin   Urticaria - History of hives with no current symptoms, not taking Allegra  regularly.  Anxiety and attention deficit symptoms - Diagnosed with ADHD in 2014 - Experiences anxiety and difficulty focusing - Previously tried Adderall but found it too strong - Experiences restlessness, difficulty sleeping, and a need to stay busy, especially when worried - No depression, but acknowledges anxiety - Currently taking fluoxetine  20 mg daily  General health and lifestyle - No chest pain, shortness of breath, palpitations, nausea, vomiting, diarrhea, cough, or other systemic symptoms - Maintains a healthy lifestyle - Does not smoke or drink    Review of Systems  Other than above, a 14 point review of systems is negative    Past Medical History:  Diagnosis Date   Allergy    Anemia    Angio-edema    Anxiety and depression 04/02/2013   Blood transfusion without reported diagnosis    CML (chronic myelocytic leukemia) (HCC) 08/16/2011   Glaucoma suspect    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)   Urticaria     Past Surgical History:  Procedure Laterality Date   CESAREAN SECTION  2006 & 2012   TUBAL LIGATION      Current Outpatient Medications  Medication Instructions   calcium  carbonate (OS-CAL - DOSED IN MG OF ELEMENTAL CALCIUM ) 1250 (500 Ca) MG tablet 1 tablet, Daily    dasatinib  (SPRYCEL ) 50 mg, Oral, Daily   EPINEPHrine  (EPIPEN  2-PAK) 0.3 mg, Intramuscular, As needed   Ferrous Gluconate-C-Folic Acid (IRON-C PO) Daily   fexofenadine  (ALLEGRA ) 180 mg, Daily   FLUoxetine  (PROZAC ) 40 mg, Oral, Daily   fluticasone  (FLONASE ) 50 MCG/ACT nasal spray 2 sprays, Each Nare, Daily   Magnesium  200 mg, Daily   nystatin -triamcinolone  ointment (MYCOLOG) Topical, Daily PRN   Vitamin D  (Cholecalciferol) 1,000 Units, Daily   Vitamins/Minerals TABS Daily       Objective:   Physical Exam BP 116/72   Pulse 63   Temp 98.3 F (36.8 C) (Oral)   Resp 16   Ht 5' 7 (1.702 m)   Wt 218 lb 8 oz (99.1 kg)   LMP  (LMP Unknown)   SpO2 97%   BMI 34.22 kg/m  General: Well developed, NAD, BMI noted Neck: No  thyromegaly  HEENT:  Normocephalic . Face symmetric, atraumatic Lungs:  CTA B Normal respiratory effort, no intercostal retractions, no accessory muscle use. Heart: RRR,  no murmur.  Abdomen:  Not distended, soft, non-tender. No rebound or rigidity.   Lower extremities: no pretibial edema bilaterally  Skin: Exposed areas without rash. Not pale. Not jaundice Neurologic:  alert & oriented X3.  Speech normal, gait appropriate for age and unassisted Strength symmetric and appropriate for age.  Psych: Cognition and judgment appear intact.  Cooperative with normal attention span and concentration.  Behavior appropriate. No anxious or depressed appearing.     Assessment  Problem list: Anxiety depression --- dx 2014 ADD dx 2015 , rx  meds 2015 , self d/c ~ 04-2015  CML ---  DX 2013 + PPD, s/p treatment, had a BCG Glaucoma suspect BTL Menopausal age 89 Chronic dermatitis (perianal, GU area) has tried different medications, currently on hydrocortisone  as needed Osteopenia T score -1.1 (11-2020) Palpable Ao : US  2015-no AAA    Assessment & Plan Here for CPX - Td 08/2018 - s/p shingrix    - pnm 23 2017; prevnar 04-2016.  PNM 20: Today - Flu shot today -  Recommend a COVID booster -Female care per gyn, PAP 02/2023 and MMG 08/2023 ([facility]).    -CCS: No previous colonoscopy, Cologuard neg 08-2020.  Cscope 03/28/2024, next per GI -US  Ao 05-2014 neg for AAA -Menopausal, age 56    -Available labs reviewed, get FLP A1c and vitamin D   Other issues addressed today Anxiety, depression, ADHD: ADHD diagnosed in 2014, on no meds, at some point was rx Adderall but felt to be too strong. On  fluoxetine  20 mg for anxiety.  Reports restlessness and difficulty focusing, especially under stress. PLAN: - Increase fluoxetine  to 30 mg every day then  40 mg daily.  See AVS - Recommend professional counseling for therapy.  Resources provided. - Consider referral to ADHD specialist if symptoms persist.  (Declined referral today) High cholesterol: Previously on atorvastatin , self DC, she thought atorvastatin  was possibly causing the hives, I do not believe that was the case. Plan: Check FLP,  Restart atorvastatin ?. Vitamin D  deficiency: Checking levels Hives (urticaria): Improved since the last visit, hardly ever takes antihistaminics Osteopenia: T-score -1.1 in 2022.  Consider recheck in 2027 RTC 4 months

## 2024-05-24 NOTE — Assessment & Plan Note (Signed)
 Here for CPX   Other issues addressed today Anxiety, depression, ADHD: ADHD diagnosed in 2014, on no meds, at some point was rx Adderall but felt to be too strong. On  fluoxetine  20 mg for anxiety.  Reports restlessness and difficulty focusing, especially under stress. PLAN: - Increase fluoxetine  to 30 mg every day then  40 mg daily.  See AVS - Recommend professional counseling for therapy.  Resources provided. - Consider referral to ADHD specialist if symptoms persist.  (Declined referral today) High cholesterol: Previously on atorvastatin , self DC, she thought atorvastatin  was possibly causing the hives, I do not believe that was the case. Plan: Check FLP,  Restart atorvastatin ?. Vitamin D  deficiency: Checking levels Hives (urticaria): Improved since the last visit, hardly ever takes antihistaminics Osteopenia: T-score -1.1 in 2022.  Consider recheck in 2027 RTC 4 months

## 2024-05-24 NOTE — Assessment & Plan Note (Signed)
 Here for CPX - Td 08/2018 - s/p shingrix    - pnm 23 2017; prevnar 04-2016.  PNM 20: Today - Flu shot today - Recommend a COVID booster -Female care per gyn, PAP 02/2023 and MMG 08/2023 ([facility]).    -CCS: No previous colonoscopy, Cologuard neg 08-2020.  Cscope 03/28/2024, next per GI -US  Ao 05-2014 neg for AAA -Menopausal, age 53    -Available labs reviewed, get FLP A1c and vitamin D 

## 2024-05-24 NOTE — Patient Instructions (Addendum)
 GO TO THE LAB :  Get the blood work    Then, go to the front desk for the checkout Please make an appointment for a checkup in 4 months   Fluoxetine  instructions Fluoxetine  10 mg: 3 tablets a day for the next 10 days Then switch to fluoxetine  40 mg: 1 tablet a day      Please read more detailed instructions below   ANXIETY AND ATTENTION DEFICIT SYMPTOMS:  You have been experiencing anxiety and difficulty focusing, especially under stress. You were diagnosed with ADHD in 2014 and are currently taking fluoxetine  20 mg daily. -Increase fluoxetine  to 40 mg daily. -Recommend professional counseling for therapy. -Consider referral to an ADHD specialist if symptoms persist.  HYPERCHOLESTEROLEMIA: You have concerns about your cholesterol levels and previously discontinued atorvastatin   -Check cholesterol levels. -Discuss potential restart of atorvastatin  based on results.

## 2024-05-25 ENCOUNTER — Ambulatory Visit: Payer: Self-pay | Admitting: Internal Medicine

## 2024-05-27 MED ORDER — VITAMIN D (ERGOCALCIFEROL) 1.25 MG (50000 UNIT) PO CAPS: 50000.0000 [IU] | ORAL_CAPSULE | ORAL | 0 refills | Status: AC

## 2024-05-30 ENCOUNTER — Other Ambulatory Visit: Payer: Self-pay

## 2024-05-30 LAB — BCR-ABL1, CML/ALL, PCR, QUANT
E1A2 Transcript: <0.0032 %
b2a2 transcript: 0.0285 %
b3a2 transcript: <0.0032 %

## 2024-05-31 ENCOUNTER — Telehealth: Payer: Self-pay | Admitting: Internal Medicine

## 2024-05-31 ENCOUNTER — Ambulatory Visit: Payer: Self-pay | Admitting: Hematology & Oncology

## 2024-05-31 NOTE — Telephone Encounter (Signed)
-----   Message from Maude JONELLE Crease sent at 05/31/2024  6:49 AM EDT ----- Please call and let her know that the chronic leukemia is improving.  The abnormal cells are much less.  Hopefully she will have total remission when she comes back.  Great job.  Jeralyn ----- Message ----- From: Rebecka, [facility] [place] Sent: 05/23/2024   3:31 PM EDT To: Maude JONELLE Crease, MD

## 2024-05-31 NOTE — Telephone Encounter (Signed)
 Called pt to schedule from recall. She said she was feeling good and didn't feel like she need an appt.

## 2024-05-31 NOTE — Telephone Encounter (Signed)
 Advised via MyChart.

## 2024-06-03 ENCOUNTER — Other Ambulatory Visit: Payer: Self-pay | Admitting: Hematology & Oncology

## 2024-06-03 ENCOUNTER — Other Ambulatory Visit (HOSPITAL_COMMUNITY): Payer: Self-pay

## 2024-06-03 ENCOUNTER — Other Ambulatory Visit: Payer: Self-pay

## 2024-06-03 MED ORDER — DASATINIB 50 MG PO TABS
50.0000 mg | ORAL_TABLET | Freq: Every day | ORAL | 2 refills | Status: DC
  Filled 2024-06-03 – 2024-06-07 (×3): qty 30, 30d supply, fill #0
  Filled 2024-07-04: qty 30, 30d supply, fill #1
  Filled 2024-08-12 – 2024-08-19 (×2): qty 30, 30d supply, fill #2

## 2024-06-05 ENCOUNTER — Other Ambulatory Visit (HOSPITAL_COMMUNITY): Payer: Self-pay

## 2024-06-05 ENCOUNTER — Telehealth: Payer: Self-pay

## 2024-06-05 ENCOUNTER — Other Ambulatory Visit: Payer: Self-pay

## 2024-06-05 NOTE — Telephone Encounter (Signed)
 Oral Oncology Patient Advocate Encounter  Prior Authorization Renewal for dasatinib  (SPRYCEL )  has been approved.    PA# [id number] Effective dates: 06/05/24 through 06/05/25     Charlott Hamilton,  CPhT-Adv  she/her/hers [place]  [facility] Pharmacy Technician Patient Advocate Specialist III [facility] Phone: [phone]  Fax: [phone] [email or web address]

## 2024-06-05 NOTE — Telephone Encounter (Signed)
 Oral Oncology Patient Advocate Encounter   Received notification that prior authorization for SPRYCEL  is due for renewal.   PA submitted on 06/05/2024 Key [id number] Status is pending      Charlott Hamilton,  CPhT-Adv  she/her/hers [facility]  [facility] Pharmacy Technician Patient Advocate Specialist III [facility] Phone: [phone]  Fax: [phone] [email or web address]

## 2024-06-05 NOTE — Progress Notes (Signed)
 Benefits Investigation Started  Rejection: Prior Authorization Required  Routed to: [facility]

## 2024-06-05 NOTE — Progress Notes (Signed)
 Prior Berkley Harvey has been approved.

## 2024-06-06 ENCOUNTER — Other Ambulatory Visit (HOSPITAL_COMMUNITY): Payer: Self-pay

## 2024-06-07 ENCOUNTER — Other Ambulatory Visit: Payer: Self-pay

## 2024-06-07 NOTE — Progress Notes (Signed)
 Specialty Pharmacy Ongoing Clinical Assessment Note  [name] is a 53 y.o. female who is being followed by the specialty pharmacy service for RxSp Oncology   Patient's specialty medication(s) reviewed today: Dasatinib  (SPRYCEL )   Missed doses in the last 4 weeks: 0   Patient/Caregiver did not have any additional questions or concerns.   Therapeutic benefit summary: Patient is achieving benefit   Adverse events/side effects summary: No adverse events/side effects   Patient's therapy is appropriate to: Continue    Goals Addressed             This Visit's Progress    Slow Disease Progression   On track    Patient is on track. Patient will maintain adherence. Per Dr. Timmy from 10/17 abnormal cells are decreasing and condition is improving.            Follow up: 6 months  [name] Specialty Pharmacist

## 2024-06-07 NOTE — Progress Notes (Signed)
 Specialty Pharmacy Refill Coordination Note  [name] is a 53 y.o. female contacted today regarding refills of specialty medication(s) Dasatinib  (SPRYCEL )   Patient requested Delivery   Delivery date: 06/12/24   Verified address: [address], [place], [address]   Medication will be filled on 06/11/24.

## 2024-06-17 ENCOUNTER — Other Ambulatory Visit: Payer: Self-pay | Admitting: Internal Medicine

## 2024-07-04 ENCOUNTER — Other Ambulatory Visit (HOSPITAL_COMMUNITY): Payer: Self-pay

## 2024-07-09 ENCOUNTER — Other Ambulatory Visit: Payer: Self-pay

## 2024-07-12 ENCOUNTER — Other Ambulatory Visit: Payer: Self-pay

## 2024-07-12 NOTE — Progress Notes (Signed)
 Specialty Pharmacy Refill Coordination Note  [name] is a 53 y.o. female contacted today regarding refills of specialty medication(s) Dasatinib  (SPRYCEL )   Patient requested Delivery   Delivery date: 07/24/24   Verified address: [address] [place], [address]   Medication will be filled on: 07/23/24

## 2024-07-23 ENCOUNTER — Other Ambulatory Visit: Payer: Self-pay

## 2024-08-12 ENCOUNTER — Other Ambulatory Visit: Payer: Self-pay

## 2024-08-16 ENCOUNTER — Other Ambulatory Visit: Payer: Self-pay

## 2024-08-19 ENCOUNTER — Other Ambulatory Visit: Payer: Self-pay

## 2024-08-20 ENCOUNTER — Other Ambulatory Visit: Payer: Self-pay

## 2024-08-20 NOTE — Progress Notes (Signed)
 Specialty Pharmacy Refill Coordination Note  [name] is a 54 y.o. female contacted today regarding refills of specialty medication(s) Dasatinib  (SPRYCEL )   Patient requested Delivery   Delivery date: 08/26/24   Verified address: [address] [place] [address]   Medication will be filled on: 08/23/24

## 2024-08-23 ENCOUNTER — Other Ambulatory Visit: Payer: Self-pay

## 2024-09-13 ENCOUNTER — Other Ambulatory Visit (HOSPITAL_COMMUNITY): Payer: Self-pay

## 2024-09-13 ENCOUNTER — Other Ambulatory Visit: Payer: Self-pay

## 2024-09-13 ENCOUNTER — Other Ambulatory Visit: Payer: Self-pay | Admitting: Hematology & Oncology

## 2024-09-13 MED ORDER — DASATINIB 50 MG PO TABS
50.0000 mg | ORAL_TABLET | Freq: Every day | ORAL | 2 refills | Status: AC
  Filled 2024-09-13 – 2024-09-20 (×2): qty 30, 30d supply, fill #0

## 2024-09-20 ENCOUNTER — Other Ambulatory Visit: Payer: Self-pay

## 2024-09-20 ENCOUNTER — Encounter: Payer: Self-pay | Admitting: Family

## 2024-09-30 ENCOUNTER — Ambulatory Visit: Admitting: Internal Medicine
